# Patient Record
Sex: Female | Born: 1995
Health system: Southern US, Community
[De-identification: ages and names within clinical notes are randomized; demographics above are authoritative.]

## PROBLEM LIST (undated history)

## (undated) ENCOUNTER — Inpatient Hospital Stay (HOSPITAL_COMMUNITY): Payer: Self-pay

## (undated) DIAGNOSIS — F419 Anxiety disorder, unspecified: Secondary | ICD-10-CM

## (undated) DIAGNOSIS — F32A Depression, unspecified: Secondary | ICD-10-CM

## (undated) DIAGNOSIS — F329 Major depressive disorder, single episode, unspecified: Secondary | ICD-10-CM

## (undated) DIAGNOSIS — D649 Anemia, unspecified: Secondary | ICD-10-CM

## (undated) DIAGNOSIS — K219 Gastro-esophageal reflux disease without esophagitis: Secondary | ICD-10-CM

## (undated) DIAGNOSIS — M549 Dorsalgia, unspecified: Secondary | ICD-10-CM

## (undated) HISTORY — DX: Gastro-esophageal reflux disease without esophagitis: K21.9

## (undated) HISTORY — PX: NASAL SINUS SURGERY: SHX719

## (undated) HISTORY — DX: Anxiety disorder, unspecified: F41.9

## (undated) HISTORY — PX: TONSILLECTOMY: SUR1361

---

## 2016-04-01 LAB — OB RESULTS CONSOLE ANTIBODY SCREEN: ANTIBODY SCREEN: NEGATIVE

## 2016-04-01 LAB — CYSTIC FIBROSIS DIAGNOSTIC STUDY: Interpretation-CFDNA:: NEGATIVE

## 2016-04-01 LAB — CYTOLOGY - PAP: Pap: NEGATIVE

## 2016-04-01 LAB — OB RESULTS CONSOLE RPR: RPR: NONREACTIVE

## 2016-04-01 LAB — OB RESULTS CONSOLE HIV ANTIBODY (ROUTINE TESTING): HIV: NONREACTIVE

## 2016-04-01 LAB — OB RESULTS CONSOLE GC/CHLAMYDIA
CHLAMYDIA, DNA PROBE: NEGATIVE
GC PROBE AMP, GENITAL: NEGATIVE

## 2016-04-01 LAB — OB RESULTS CONSOLE RUBELLA ANTIBODY, IGM: Rubella: IMMUNE

## 2016-04-01 LAB — OB RESULTS CONSOLE ABO/RH: RH TYPE: POSITIVE

## 2016-04-01 LAB — OB RESULTS CONSOLE HEPATITIS B SURFACE ANTIGEN: HEP B S AG: NEGATIVE

## 2016-05-22 ENCOUNTER — Encounter: Payer: Self-pay | Admitting: Family Medicine

## 2016-05-23 DIAGNOSIS — Z3490 Encounter for supervision of normal pregnancy, unspecified, unspecified trimester: Secondary | ICD-10-CM | POA: Insufficient documentation

## 2016-05-28 ENCOUNTER — Encounter: Payer: Self-pay | Admitting: Obstetrics and Gynecology

## 2016-05-28 ENCOUNTER — Other Ambulatory Visit: Payer: Self-pay | Admitting: Obstetrics and Gynecology

## 2016-05-28 ENCOUNTER — Ambulatory Visit (INDEPENDENT_AMBULATORY_CARE_PROVIDER_SITE_OTHER): Payer: Medicaid Other | Admitting: Obstetrics and Gynecology

## 2016-05-28 VITALS — BP 126/66 | HR 85 | Ht 65.0 in | Wt 110.1 lb

## 2016-05-28 DIAGNOSIS — Z3401 Encounter for supervision of normal first pregnancy, first trimester: Secondary | ICD-10-CM

## 2016-05-28 NOTE — Progress Notes (Signed)
New OB Note  05/28/2016   Clinic: Center for [facility]  Chief Complaint: NOB  Transfer of Care Patient: Yes, from [place]  History of Present Illness: Kaitlyn Whitaker is a 20 y.o. G2P0010 @ 18/6 weeks (EDC 2-21, based on Patient's last menstrual period was 01/17/2016 (exact date).=13wk u/s), with the above CC. Preg complicated by has Supervision of normal pregnancy on her problem list.   Patient from here but was in [place] with FOB but relationship didn't work out so she's back home with family. Patient states she's gained 15 lbs since start of pregnancy   She has Negative signs or symptoms of nausea/vomiting of pregnancy. She has Negative signs or symptoms of miscarriage or preterm labor She identifies Negative Zika risk factors for her and her partner On any different medications around the time she conceived/early pregnancy: No   ROS: A 12-point review of systems was performed and negative, except as stated in the above HPI.  OBGYN History: As per HPI. OB History  Gravida Para Term Preterm AB Living  2       1    SAB TAB Ectopic Multiple Live Births  1            # Outcome Date GA Lbr Len/2nd Weight Sex Delivery Anes PTL Lv  2 Current           1 SAB               Any issues with any prior pregnancies: not applicable Any prior children are healthy, doing well, without any problems or issues: not applicable History of pap smears: No. Last pap smear 2017. Abnormal: no  History of STIs: No   Past Medical History: Past Medical History:  Diagnosis Date  . GERD (gastroesophageal reflux disease)     Past Surgical History: Past Surgical History:  Procedure Laterality Date  . TONSILLECTOMY      Family History:  Family History  Problem Relation Age of Onset  . Diabetes Maternal Uncle    She denies any history of mental retardation, birth defects or genetic disorders in her or the FOB's history  Social History:  Social History   Social History  . Marital  status: Single    Spouse name: N/A  . Number of children: N/A  . Years of education: N/A   Occupational History  . Not on file.   Social History Main Topics  . Smoking status: Never Smoker  . Smokeless tobacco: Never Used  . Alcohol use No  . Drug use: Unknown  . Sexual activity: No   Other Topics Concern  . Not on file   Social History Narrative  . No narrative on file   Any pets in the household: no Works at a Daycare  Allergy: Not on File  Health Maintenance:  Mammogram Up to Date: not applicable  Current Outpatient Medications: PNV  Physical Exam:   BP 126/66   Pulse 85   Ht 5\' 5"  (1.651 m)   Wt 110 lb 1.6 oz (49.9 kg)   LMP 01/17/2016 (Exact Date)   BMI 18.32 kg/m  Body mass index is 18.32 kg/m. Fundal height: not applicable FHTs: 140s  General appearance: Well nourished, well developed female in no acute distress.  Neck:  Supple, normal appearance, and no thyromegaly  Cardiovascular: S1, S2 normal, no murmur, rub or gallop, regular rate and rhythm Respiratory:  Clear to auscultation bilateral. Normal respiratory effort Abdomen: positive bowel sounds and no masses, hernias; diffusely non tender  to palpation, non distended Neuro/Psych:  Normal mood and affect.  Skin:  Warm and dry.  Lymphatic:  No inguinal lymphadenopathy. Pelvic exam: deferred  Laboratory: B pos/RI/rpr NR/hepB neg/hiv NR/ Negative: TSH, Ucx, GC-CT; negative inherigen fragile x, CF and SMA  Imaging:  Patient has picture of u/s from Co with her  Assessment: pt doing well  Plan: 1. Encounter for supervision of normal first pregnancy in first trimester Anatomy scan scheduled. Pt told about infectious precautions working at day care including flu, parvo, etc and told to let us know if any concern to any exposures. Pt declines afp tetra. Flu shot nv.   - Pain Mgmt, Profile 6 Conf w/o mM, U - Culture, OB Urine - US MFM OB COMP + 14 WK; Future  Problem list reviewed and  updated.  Follow up in 4 weeks.  >50% of 25 min visit spent on counseling and coordination of care.     Kaitlyn Whitaker, Jr. MD Attending [facility](Faculty Practice)

## 2016-05-29 ENCOUNTER — Encounter (HOSPITAL_COMMUNITY): Payer: Self-pay | Admitting: Obstetrics and Gynecology

## 2016-05-29 LAB — POCT URINALYSIS DIP (DEVICE)
Bilirubin Urine: NEGATIVE
GLUCOSE, UA: NEGATIVE mg/dL
HGB URINE DIPSTICK: NEGATIVE
Ketones, ur: NEGATIVE mg/dL
NITRITE: NEGATIVE
Protein, ur: NEGATIVE mg/dL
Specific Gravity, Urine: 1.015 (ref 1.005–1.030)
UROBILINOGEN UA: 0.2 mg/dL (ref 0.0–1.0)
pH: 7.5 (ref 5.0–8.0)

## 2016-05-29 LAB — CULTURE, OB URINE
Colony Count: NO GROWTH
Organism ID, Bacteria: NO GROWTH

## 2016-05-31 LAB — PAIN MGMT, PROFILE 6 CONF W/O MM, U
6 ACETYLMORPHINE: NEGATIVE ng/mL (ref <10)
AMPHETAMINES: NEGATIVE ng/mL (ref <500)
Alcohol Metabolites: NEGATIVE ng/mL (ref <500)
BARBITURATES: NEGATIVE ng/mL (ref <300)
Benzodiazepines: NEGATIVE ng/mL (ref <100)
COCAINE METABOLITE: NEGATIVE ng/mL (ref <150)
CREATININE: 70.7 mg/dL (ref >=20.0)
Marijuana Metabolite: NEGATIVE ng/mL (ref <20)
Methadone Metabolite: NEGATIVE ng/mL (ref <100)
OXIDANT: NEGATIVE ug/mL (ref <200)
OXYCODONE: NEGATIVE ng/mL (ref <100)
Opiates: NEGATIVE ng/mL (ref <100)
PH: 6.53 (ref 4.5–9.0)
PHENCYCLIDINE: NEGATIVE ng/mL (ref <25)
Please note:: 0

## 2016-06-07 ENCOUNTER — Ambulatory Visit (HOSPITAL_COMMUNITY): Payer: Medicaid Other

## 2016-06-21 ENCOUNTER — Other Ambulatory Visit: Payer: Self-pay | Admitting: Obstetrics and Gynecology

## 2016-06-21 ENCOUNTER — Ambulatory Visit (HOSPITAL_COMMUNITY)
Admission: RE | Admit: 2016-06-21 | Discharge: 2016-06-21 | Disposition: A | Discharge status: Home / Self Care | Payer: Medicaid Other | Source: Ambulatory Visit | Attending: Obstetrics and Gynecology | Admitting: Obstetrics and Gynecology

## 2016-06-21 DIAGNOSIS — Z3A22 22 weeks gestation of pregnancy: Secondary | ICD-10-CM

## 2016-06-21 DIAGNOSIS — Z3401 Encounter for supervision of normal first pregnancy, first trimester: Secondary | ICD-10-CM

## 2016-06-21 DIAGNOSIS — Z363 Encounter for antenatal screening for malformations: Secondary | ICD-10-CM | POA: Diagnosis present

## 2016-06-25 ENCOUNTER — Ambulatory Visit (INDEPENDENT_AMBULATORY_CARE_PROVIDER_SITE_OTHER): Payer: Medicaid Other | Admitting: Obstetrics and Gynecology

## 2016-06-25 ENCOUNTER — Telehealth: Payer: Self-pay | Admitting: *Deleted

## 2016-06-25 VITALS — BP 116/65 | HR 71 | Wt 119.0 lb

## 2016-06-25 DIAGNOSIS — Z3402 Encounter for supervision of normal first pregnancy, second trimester: Secondary | ICD-10-CM

## 2016-06-25 DIAGNOSIS — Z23 Encounter for immunization: Secondary | ICD-10-CM

## 2016-06-25 NOTE — Addendum Note (Signed)
Addended by: Faythe CasaBELLAMY, Nole Robey M on: 06/25/2016 12:05 PM   Modules accepted: Orders

## 2016-06-25 NOTE — Progress Notes (Signed)
Flu vaccine today 

## 2016-06-25 NOTE — Patient Instructions (Signed)

## 2016-06-25 NOTE — Progress Notes (Signed)
   PRENATAL VISIT NOTE  Subjective:  Kaitlyn Whitaker is a 20 y.o. G2P0010 at 4560w6d being seen today for ongoing prenatal care.  She is currently monitored for the following issues for this low-risk pregnancy and has Supervision of normal pregnancy on her problem list.  Patient reports no complaints.  Contractions: Not present. Vag. Bleeding: None.  Movement: Present. Denies leaking of fluid.   The following portions of the patient's history were reviewed and updated as appropriate: allergies, current medications, past family history, past medical history, past social history, past surgical history and problem list. Problem list updated. Works as Teaching laboratory techniciantoddler teacher.   Objective:   Vitals:   06/25/16 1125  BP: 116/65  Pulse: 71  Weight: 119 lb (54 kg)    Fetal Status: Fetal Heart Rate (bpm): 146   Movement: Present     General:  Alert, oriented and cooperative. Patient is in no acute distress.  Skin: Skin is warm and dry. No rash noted.   Cardiovascular: Normal heart rate noted  Respiratory: Normal respiratory effort, no problems with respiration noted  Abdomen: Soft, gravid, appropriate for gestational age. Pain/Pressure: Present     Pelvic:  Cervical exam deferred        Extremities: Normal range of motion.  Edema: None  Mental Status: Normal mood and affect. Normal behavior. Normal judgment and thought content.   Assessment and Plan:  Pregnancy: G2P0010 at 8360w6d Encounter for supervision of normal first pregnancy in second trimester  Preterm labor symptoms and general obstetric precautions including but not limited to vaginal bleeding, contractions, leaking of fluid and fetal movement were reviewed in detail with the patient. Please refer to After Visit Summary for other counseling recommendations.  Return in about 4 weeks (around 07/23/2016).  Danae Orleanseirdre C Aarion Kittrell, CNM

## 2016-06-25 NOTE — Telephone Encounter (Signed)
Pt left message yesterday @ 1028 stating that she had received a call regarding a prescription but when she called her pharmacy they did not have any prescription on file for her. Per chart review, no prescription has been given by any of our providers and there is not any documentation about a call placed to her. I called pt and left message on her personal voice mail stating this information. Pt has appt today in our office @ 1040.

## 2016-07-04 LAB — AMB REFERRAL TO OB-GYN: Urine Culture, OB: NO GROWTH

## 2016-08-01 ENCOUNTER — Ambulatory Visit (INDEPENDENT_AMBULATORY_CARE_PROVIDER_SITE_OTHER): Payer: Medicaid Other | Admitting: Family

## 2016-08-01 DIAGNOSIS — Z23 Encounter for immunization: Secondary | ICD-10-CM

## 2016-08-01 DIAGNOSIS — Z3403 Encounter for supervision of normal first pregnancy, third trimester: Secondary | ICD-10-CM | POA: Diagnosis present

## 2016-08-01 NOTE — Patient Instructions (Signed)
CIRCUMCISION  Circumcision is considered an elective/non-medically necessary procedure. There are many reasons parents decide to have their sons circumsized. During the first year of life circumcised males have a reduced risk of urinary tract infections but after this year the rates between circumcised males and uncircumcised males are the same.  It is safe to have your son circumcised outside of the hospital and the places above perform them regularly.    Places to have your son circumcised:    [facility] [phone] $480 by 4 wks  [facility] [phone] $244 by 4 wks  [facility] [phone] $175 by 2 wks  [facility] [phone] $250 by 7 days [facility] [phone] $150 by 4 wks  These prices sometimes change but are roughly what you can expect to pay. Please call and confirm pricing.    AREA PEDIATRIC/FAMILY PRACTICE PHYSICIANS  [facility] [address], [address] [place], [place]  [address] Phone - [phone]   Fax - [phone]  [facility] [address] [place], [place] [address] Phone - [phone]   Fax - [phone]  [facility] [address] [place], [place]  [address] Phone - [phone]   Fax - [phone]  [facility] [address] [place], [place]  [address] Phone - [phone]   Fax - [phone]  [facility] [address] [place], [place]  [address] Phone - [phone]   Fax - [phone]  [facility] [address] [place], [place]  [address] Phone - [phone]   Fax - [phone]  [facility] [address] [place], [place]  [address] Phone - [phone]   Fax - [phone]  [facility] [address] [place], [place]  [address] Phone - [phone]   Fax - [phone]  [facility] [address] [place], [place]  [address] Phone - [phone]   Fax - [phone] [facility] [address] [place], [place]  [address] Phone - [phone]   Fax - [phone]  [facility] [address] [place], [place]  [address] Phone - [phone]   Fax - [phone]  [facility] [address] [place], [place]  [address] Phone - [phone]   Fax - [phone]  [facility] [address] [place], [place]  [address] Phone - [phone]   Fax - [phone]  [facility] [address] [place], [place]  [address] Phone - [phone]  [facility] [address] [place], [place]  [address] Phone - [phone]   Fax - [phone]  [facility] [address] [place], [place]  [address] Phone - [phone]   Fax - [phone]  [facility] [address] [place], [place]  [address] Phone - [phone]   Fax - [phone]  [facility] [address] [place], [place]  [address] Phone - [phone]   Fax - [phone]   [facility] [address] [place], [place]  [address] Phone - [phone]   Fax - [phone]  [facility] [address] [place], [place]  [address] Phone - [phone]   Fax - [phone]  [facility] [address] [place], [place]  [address] Phone - [phone]   Fax - [phone]  [facility] [address] [place], [place]  [address] Phone - [phone]   Fax - [phone]  [facility] [address] [place], [place]  [address] Phone - [phone]   Fax - [phone] [facility] [place] [id number] [place], [place]  [address] Phone - [phone]   Fax - [phone]  [facility] [address] [place], [place]  [address] Phone - [phone]   Fax - [phone]  [facility] [place] [address] [place], [place]  [address] Phone - [phone]   Fax - [phone]

## 2016-08-01 NOTE — Progress Notes (Signed)
tdap given

## 2016-08-01 NOTE — Progress Notes (Signed)
   PRENATAL VISIT NOTE  Subjective:  Kaitlyn Whitaker is a 20 y.o. G2P0010 at 4663w1d being seen today for ongoing prenatal care.  She is currently monitored for the following issues for this low-risk pregnancy and has Supervision of normal pregnancy on her problem list.  Patient reports no complaints.  Contractions: Not present.  .  Movement: Present. Denies leaking of fluid.   The following portions of the patient's history were reviewed and updated as appropriate: allergies, current medications, past family history, past medical history, past social history, past surgical history and problem list. Problem list updated.  Objective:   Vitals:   08/01/16 0806  BP: 116/72  Pulse: 80  Weight: 125 lb (56.7 kg)    Fetal Status: Fetal Heart Rate (bpm): 128 Fundal Height: 27 cm Movement: Present     General:  Alert, oriented and cooperative. Patient is in no acute distress.  Skin: Skin is warm and dry. No rash noted.   Cardiovascular: Normal heart rate noted  Respiratory: Normal respiratory effort, no problems with respiration noted  Abdomen: Soft, gravid, appropriate for gestational age. Pain/Pressure: Present     Pelvic:  Cervical exam deferred        Extremities: Normal range of motion.     Mental Status: Normal mood and affect. Normal behavior. Normal judgment and thought content.   Assessment and Plan:  Pregnancy: G2P0010 at 5663w1d  1. Encounter for supervision of normal first pregnancy in third trimester -Tdap today - Plans to return for 2 hr - Reviewed fundal height and normal FHR range  Preterm labor symptoms and general obstetric precautions including but not limited to vaginal bleeding, contractions, leaking of fluid and fetal movement were reviewed in detail with the patient. Please refer to After Visit Summary for other counseling recommendations.  Return in about 2 weeks (around 08/15/2016) for 1 wk 2 hr only.   Eino FarberWalidah Kennith GainN Karim, CNM

## 2016-08-08 ENCOUNTER — Other Ambulatory Visit: Payer: Medicaid Other

## 2016-08-08 DIAGNOSIS — Z3400 Encounter for supervision of normal first pregnancy, unspecified trimester: Secondary | ICD-10-CM

## 2016-08-08 LAB — CBC
HCT: 33.5 % — ABNORMAL LOW (ref 35.0–45.0)
Hemoglobin: 11.2 g/dL — ABNORMAL LOW (ref 11.7–15.5)
MCH: 29.3 pg (ref 27.0–33.0)
MCHC: 33.4 g/dL (ref 32.0–36.0)
MCV: 87.7 fL (ref 80.0–100.0)
MPV: 9 fL (ref 7.5–12.5)
PLATELETS: 224 10*3/uL (ref 140–400)
RBC: 3.82 MIL/uL (ref 3.80–5.10)
RDW: 12.5 % (ref 11.0–15.0)
WBC: 9.5 10*3/uL (ref 3.8–10.8)

## 2016-08-09 LAB — RPR

## 2016-08-09 LAB — 2HR GTT W 1 HR, CARPENTER, 75 G
GLUCOSE, 2 HR, GEST: 69 mg/dL (ref <153)
Glucose, 1 Hr, Gest: 97 mg/dL (ref <180)
Glucose, Fasting, Gest: 78 mg/dL (ref 65–91)

## 2016-08-09 LAB — HIV ANTIBODY (ROUTINE TESTING W REFLEX): HIV 1&2 Ab, 4th Generation: NONREACTIVE

## 2016-08-15 ENCOUNTER — Ambulatory Visit (INDEPENDENT_AMBULATORY_CARE_PROVIDER_SITE_OTHER): Payer: Medicaid Other | Admitting: Family

## 2016-08-15 DIAGNOSIS — Z3403 Encounter for supervision of normal first pregnancy, third trimester: Secondary | ICD-10-CM

## 2016-08-15 NOTE — Progress Notes (Signed)
   PRENATAL VISIT NOTE  Subjective:  Kaitlyn Whitaker is a 20 y.o. G2P0010 at 2022w1d being seen today for ongoing prenatal care.  She is currently monitored for the following issues for this low-risk pregnancy and has Supervision of normal pregnancy, antepartum on her problem list.  Patient reports no complaints.  Contractions: Not present.  .  Movement: Present. Denies leaking of fluid.   The following portions of the patient's history were reviewed and updated as appropriate: allergies, current medications, past family history, past medical history, past social history, past surgical history and problem list. Problem list updated.  Objective:   Vitals:   08/15/16 0807  BP: (!) 120/57  Pulse: 77  Weight: 127 lb 1.6 oz (57.7 kg)    Fetal Status: Fetal Heart Rate (bpm): 150 Fundal Height: 29 cm Movement: Present     General:  Alert, oriented and cooperative. Patient is in no acute distress.  Skin: Skin is warm and dry. No rash noted.   Cardiovascular: Normal heart rate noted  Respiratory: Normal respiratory effort, no problems with respiration noted  Abdomen: Soft, gravid, appropriate for gestational age. Pain/Pressure: Present     Pelvic:  Cervical exam deferred        Extremities: Normal range of motion.  Edema: None  Mental Status: Normal mood and affect. Normal behavior. Normal judgment and thought content.   Assessment and Plan:  Pregnancy: G2P0010 at 202w1d  1. Encounter for supervision of normal first pregnancy in third trimester - Reviewed third trimester lab results - discussed prenatal visit schedule  Preterm labor symptoms and general obstetric precautions including but not limited to vaginal bleeding, contractions, leaking of fluid and fetal movement were reviewed in detail with the patient. Please refer to After Visit Summary for other counseling recommendations.  Return in about 2 weeks (around 08/29/2016).   Eino FarberWalidah Kennith GainN Karim, CNM

## 2016-08-30 ENCOUNTER — Encounter: Payer: Medicaid Other | Admitting: Family Medicine

## 2016-09-02 NOTE — L&D Delivery Note (Signed)
20 y.o. G2P0010 at 7648w6d delivered a viable female infant in cephalic, LOA position. No nuchal cord. Right anterior shoulder delivered with ease. 60 sec delayed cord clamping. Cord clamped x2 and cut. Placenta delivered spontaneously intact, with 3VC. Fundus firm on exam with massage and pitocin. Good hemostasis noted.  Anesthesia: Epidural Laceration: Bilateral periurethral Suture: 4-0 Monocryl Good hemostasis noted. EBL: 200 cc  Mom and baby recovering in LDR.    Apgars: APGAR (1 MIN): 8   APGAR (5 MINS): 9    Weight: Pending skin to skin    Kaitlyn MowElizabeth Sharnelle Cappelli, DO OB Fellow [facility] 10/15/2016, 9:05 PM

## 2016-09-04 ENCOUNTER — Encounter: Payer: Medicaid Other | Admitting: Obstetrics and Gynecology

## 2016-09-10 ENCOUNTER — Ambulatory Visit (INDEPENDENT_AMBULATORY_CARE_PROVIDER_SITE_OTHER): Payer: Medicaid Other | Admitting: Advanced Practice Midwife

## 2016-09-10 VITALS — BP 115/70 | HR 76 | Wt 133.0 lb

## 2016-09-10 DIAGNOSIS — O26843 Uterine size-date discrepancy, third trimester: Secondary | ICD-10-CM

## 2016-09-10 DIAGNOSIS — Z3403 Encounter for supervision of normal first pregnancy, third trimester: Secondary | ICD-10-CM

## 2016-09-10 NOTE — Progress Notes (Signed)
   PRENATAL VISIT NOTE  Subjective:  Kaitlyn Whitaker is a 21 y.o. G2P0010 at 2259w6d being seen today for ongoing prenatal care.  She is currently monitored for the following issues for this low-risk pregnancy and has Supervision of normal pregnancy, antepartum on her problem list.  Patient reports no complaints.  Contractions: Not present. Vag. Bleeding: None.  Movement: Present. Denies leaking of fluid.   The following portions of the patient's history were reviewed and updated as appropriate: allergies, current medications, past family history, past medical history, past social history, past surgical history and problem list. Problem list updated.  Objective:   Vitals:   09/10/16 0840  BP: 115/70  Pulse: 76  Weight: 133 lb (60.3 kg)    Fetal Status: Fetal Heart Rate (bpm): 130 Fundal Height: 30 cm Movement: Present     General:  Alert, oriented and cooperative. Patient is in no acute distress.  Skin: Skin is warm and dry. No rash noted.   Cardiovascular: Normal heart rate noted  Respiratory: Normal respiratory effort, no problems with respiration noted  Abdomen: Soft, gravid, appropriate for gestational age. Pain/Pressure: Present     Pelvic:  Cervical exam deferred        Extremities: Normal range of motion.  Edema: None  Mental Status: Normal mood and affect. Normal behavior. Normal judgment and thought content.   Assessment and Plan:  Pregnancy: G2P0010 at 8359w6d  1. Encounter for supervision of normal first pregnancy in third trimester   2. Uterine size date discrepancy pregnancy, third trimester --Pt missed last OB appt for family reasons, FH was 29 cm at 30 weeks.  Measures 30 cm at today's visit. Growth US ordered with MFM. - US MFM OB FOLLOW UP; Future  Preterm labor symptoms and general obstetric precautions including but not limited to vaginal bleeding, contractions, leaking of fluid and fetal movement were reviewed in detail with the patient. Please refer to  After Visit Summary for other counseling recommendations.  Return in about 2 weeks (around 09/24/2016).   Hurshel PartyLisa A Leftwich-Kirby, CNM

## 2016-09-10 NOTE — Progress Notes (Signed)
   PRENATAL VISIT NOTE  Subjective:  Kaitlyn Whitaker is a 21 y.o. G2P0010 at 104104w6d being seen today for ongoing prenatal care.  She is currently monitored for the following issues for this low-risk pregnancy and has Supervision of normal pregnancy, antepartum on her problem list.  Patient reports no complaints.  Contractions: Not present. Vag. Bleeding: None.  Movement: Present. Denies leaking of fluid.   The following portions of the patient's history were reviewed and updated as appropriate: allergies, current medications, past family history, past medical history, past social history, past surgical history and problem list. Problem list updated.  Objective:   Vitals:   09/10/16 0840  BP: 115/70  Pulse: 76  Weight: 133 lb (60.3 kg)    Fetal Status: Fetal Heart Rate (bpm): 130 Fundal Height: 30 cm Movement: Present     General:  Alert, oriented and cooperative. Patient is in no acute distress.  Skin: Skin is warm and dry. No rash noted.   Cardiovascular: Normal heart rate noted  Respiratory: Normal respiratory effort, no problems with respiration noted  Abdomen: Soft, gravid, appropriate for gestational age. Pain/Pressure: Present     Pelvic:  Cervical exam deferred        Extremities: Normal range of motion.  Edema: None  Mental Status: Normal mood and affect. Normal behavior. Normal judgment and thought content.   Assessment and Plan:  Pregnancy: G2P0010 at 18104w6d  1. Encounter for supervision of normal first pregnancy in third trimester   2. Uterine size date discrepancy pregnancy, third trimester --FH30 cm today at 45104w6d, 29 cm at last vist at 30 weeks. Pt missed previous OB visit for family reasons.  F/U US ordered for growth. - US MFM OB FOLLOW UP; Future  Preterm labor symptoms and general obstetric precautions including but not limited to vaginal bleeding, contractions, leaking of fluid and fetal movement were reviewed in detail with the patient. Please refer to  After Visit Summary for other counseling recommendations.  Return in about 2 weeks (around 09/24/2016).   Hurshel PartyLisa A Leftwich-Kirby, CNM

## 2016-09-10 NOTE — Patient Instructions (Signed)
Third Trimester of Pregnancy The third trimester is from week 29 through week 40 (months 7 through 9). The third trimester is a time when the unborn baby (fetus) is growing rapidly. At the end of the ninth month, the fetus is about 20 inches in length and weighs 6-10 pounds. Body changes during your third trimester Your body goes through many changes during pregnancy. The changes vary from woman to woman. During the third trimester:  Your weight will continue to increase. You can expect to gain 25-35 pounds (11-16 kg) by the end of the pregnancy.  You may begin to get stretch marks on your hips, abdomen, and breasts.  You may urinate more often because the fetus is moving lower into your pelvis and pressing on your bladder.  You may develop or continue to have heartburn. This is caused by increased hormones that slow down muscles in the digestive tract.  You may develop or continue to have constipation because increased hormones slow digestion and cause the muscles that push waste through your intestines to relax.  You may develop hemorrhoids. These are swollen veins (varicose veins) in the rectum that can itch or be painful.  You may develop swollen, bulging veins (varicose veins) in your legs.  You may have increased body aches in the pelvis, back, or thighs. This is due to weight gain and increased hormones that are relaxing your joints.  You may have changes in your hair. These can include thickening of your hair, rapid growth, and changes in texture. Some women also have hair loss during or after pregnancy, or hair that feels dry or thin. Your hair will most likely return to normal after your baby is born.  Your breasts will continue to grow and they will continue to become tender. A yellow fluid (colostrum) may leak from your breasts. This is the first milk you are producing for your baby.  Your belly button may stick out.  You may notice more swelling in your hands, face, or  ankles.  You may have increased tingling or numbness in your hands, arms, and legs. The skin on your belly may also feel numb.  You may feel short of breath because of your expanding uterus.  You may have more problems sleeping. This can be caused by the size of your belly, increased need to urinate, and an increase in your body's metabolism.  You may notice the fetus "dropping," or moving lower in your abdomen.  You may have increased vaginal discharge.  Your cervix becomes thin and soft (effaced) near your due date. What to expect at prenatal visits You will have prenatal exams every 2 weeks until week 36. Then you will have weekly prenatal exams. During a routine prenatal visit:  You will be weighed to make sure you and the fetus are growing normally.  Your blood pressure will be taken.  Your abdomen will be measured to track your baby's growth.  The fetal heartbeat will be listened to.  Any test results from the previous visit will be discussed.  You may have a cervical check near your due date to see if you have effaced. At around 36 weeks, your health care provider will check your cervix. At the same time, your health care provider will also perform a test on the secretions of the vaginal tissue. This test is to determine if a type of bacteria, Group B streptococcus, is present. Your health care provider will explain this further. Your health care provider may ask you:    What your birth plan is.  How you are feeling.  If you are feeling the baby move.  If you have had any abnormal symptoms, such as leaking fluid, bleeding, severe headaches, or abdominal cramping.  If you are using any tobacco products, including cigarettes, chewing tobacco, and electronic cigarettes.  If you have any questions. Other tests or screenings that may be performed during your third trimester include:  Blood tests that check for low iron levels (anemia).  Fetal testing to check the health,  activity level, and growth of the fetus. Testing is done if you have certain medical conditions or if there are problems during the pregnancy.  Nonstress test (NST). This test checks the health of your baby to make sure there are no signs of problems, such as the baby not getting enough oxygen. During this test, a belt is placed around your belly. The baby is made to move, and its heart rate is monitored during movement. What is false labor? False labor is a condition in which you feel small, irregular tightenings of the muscles in the womb (contractions) that eventually go away. These are called Braxton Hicks contractions. Contractions may last for hours, days, or even weeks before true labor sets in. If contractions come at regular intervals, become more frequent, increase in intensity, or become painful, you should see your health care provider. What are the signs of labor?  Abdominal cramps.  Regular contractions that start at 10 minutes apart and become stronger and more frequent with time.  Contractions that start on the top of the uterus and spread down to the lower abdomen and back.  Increased pelvic pressure and dull back pain.  A watery or bloody mucus discharge that comes from the vagina.  Leaking of amniotic fluid. This is also known as your "water breaking." It could be a slow trickle or a gush. Let your doctor know if it has a color or strange odor. If you have any of these signs, call your health care provider right away, even if it is before your due date. Follow these instructions at home: Eating and drinking  Continue to eat regular, healthy meals.  Do not eat:  Raw meat or meat spreads.  Unpasteurized milk or cheese.  Unpasteurized juice.  Store-made salad.  Refrigerated smoked seafood.  Hot dogs or deli meat, unless they are piping hot.  More than 6 ounces of albacore tuna a week.  Shark, swordfish, king mackerel, or tile fish.  Store-made salads.  Raw  sprouts, such as mung bean or alfalfa sprouts.  Take prenatal vitamins as told by your health care provider.  Take 1000 mg of calcium daily as told by your health care provider.  If you develop constipation:  Take over-the-counter or prescription medicines.  Drink enough fluid to keep your urine clear or pale yellow.  Eat foods that are high in fiber, such as fresh fruits and vegetables, whole grains, and beans.  Limit foods that are high in fat and processed sugars, such as fried and sweet foods. Activity  Exercise only as directed by your health care provider. Healthy pregnant women should aim for 2 hours and 30 minutes of moderate exercise per week. If you experience any pain or discomfort while exercising, stop.  Avoid heavy lifting.  Do not exercise in extreme heat or humidity, or at high altitudes.  Wear low-heel, comfortable shoes.  Practice good posture.  Do not travel far distances unless it is absolutely necessary and only with the approval   of your health care provider.  Wear your seat belt at all times while in a car, on a bus, or on a plane.  Take frequent breaks and rest with your legs elevated if you have leg cramps or low back pain.  Do not use hot tubs, steam rooms, or saunas.  You may continue to have sex unless your health care provider tells you otherwise. Lifestyle  Do not use any products that contain nicotine or tobacco, such as cigarettes and e-cigarettes. If you need help quitting, ask your health care provider.  Do not drink alcohol.  Do not use any medicinal herbs or unprescribed drugs. These chemicals affect the formation and growth of the baby.  If you develop varicose veins:  Wear support pantyhose or compression stockings as told by your healthcare provider.  Elevate your feet for 15 minutes, 3-4 times a day.  Wear a supportive maternity bra to help with breast tenderness. General instructions  Take over-the-counter and prescription  medicines only as told by your health care provider. There are medicines that are either safe or unsafe to take during pregnancy.  Take warm sitz baths to soothe any pain or discomfort caused by hemorrhoids. Use hemorrhoid cream or witch hazel if your health care provider approves.  Avoid cat litter boxes and soil used by cats. These carry germs that can cause birth defects in the baby. If you have a cat, ask someone to clean the litter box for you.  To prepare for the arrival of your baby:  Take prenatal classes to understand, practice, and ask questions about the labor and delivery.  Make a trial run to the hospital.  Visit the hospital and tour the maternity area.  Arrange for maternity or paternity leave through employers.  Arrange for family and friends to take care of pets while you are in the hospital.  Purchase a rear-facing car seat and make sure you know how to install it in your car.  Pack your hospital bag.  Prepare the baby's nursery. Make sure to remove all pillows and stuffed animals from the baby's crib to prevent suffocation.  Visit your dentist if you have not gone during your pregnancy. Use a soft toothbrush to brush your teeth and be gentle when you floss.  Keep all prenatal follow-up visits as told by your health care provider. This is important. Contact a health care provider if:  You are unsure if you are in labor or if your water has broken.  You become dizzy.  You have mild pelvic cramps, pelvic pressure, or nagging pain in your abdominal area.  You have lower back pain.  You have persistent nausea, vomiting, or diarrhea.  You have an unusual or bad smelling vaginal discharge.  You have pain when you urinate. Get help right away if:  You have a fever.  You are leaking fluid from your vagina.  You have spotting or bleeding from your vagina.  You have severe abdominal pain or cramping.  You have rapid weight loss or weight gain.  You have  shortness of breath with chest pain.  You notice sudden or extreme swelling of your face, hands, ankles, feet, or legs.  Your baby makes fewer than 10 movements in 2 hours.  You have severe headaches that do not go away with medicine.  You have vision changes. Summary  The third trimester is from week 29 through week 40, months 7 through 9. The third trimester is a time when the unborn baby (fetus)   is growing rapidly.  During the third trimester, your discomfort may increase as you and your baby continue to gain weight. You may have abdominal, leg, and back pain, sleeping problems, and an increased need to urinate.  During the third trimester your breasts will keep growing and they will continue to become tender. A yellow fluid (colostrum) may leak from your breasts. This is the first milk you are producing for your baby.  False labor is a condition in which you feel small, irregular tightenings of the muscles in the womb (contractions) that eventually go away. These are called Braxton Hicks contractions. Contractions may last for hours, days, or even weeks before true labor sets in.  Signs of labor can include: abdominal cramps; regular contractions that start at 10 minutes apart and become stronger and more frequent with time; watery or bloody mucus discharge that comes from the vagina; increased pelvic pressure and dull back pain; and leaking of amniotic fluid. This information is not intended to replace advice given to you by your health care provider. Make sure you discuss any questions you have with your health care provider. Document Released: 08/13/2001 Document Revised: 01/25/2016 Document Reviewed: 10/20/2012 Elsevier Interactive Patient Education  2017 [employer]  

## 2016-09-13 ENCOUNTER — Encounter (HOSPITAL_COMMUNITY): Payer: Self-pay | Admitting: *Deleted

## 2016-09-13 ENCOUNTER — Inpatient Hospital Stay (HOSPITAL_COMMUNITY)
Admission: AD | Admit: 2016-09-13 | Discharge: 2016-09-14 | Disposition: A | Discharge status: Home / Self Care | Payer: Medicaid Other | Source: Ambulatory Visit | Attending: Obstetrics & Gynecology | Admitting: Obstetrics & Gynecology

## 2016-09-13 DIAGNOSIS — R000 Tachycardia, unspecified: Secondary | ICD-10-CM | POA: Diagnosis not present

## 2016-09-13 DIAGNOSIS — J069 Acute upper respiratory infection, unspecified: Secondary | ICD-10-CM

## 2016-09-13 DIAGNOSIS — Z3A34 34 weeks gestation of pregnancy: Secondary | ICD-10-CM | POA: Diagnosis not present

## 2016-09-13 DIAGNOSIS — O99513 Diseases of the respiratory system complicating pregnancy, third trimester: Secondary | ICD-10-CM | POA: Diagnosis not present

## 2016-09-13 DIAGNOSIS — R002 Palpitations: Secondary | ICD-10-CM | POA: Diagnosis present

## 2016-09-13 LAB — URINALYSIS, ROUTINE W REFLEX MICROSCOPIC
BILIRUBIN URINE: NEGATIVE
Glucose, UA: NEGATIVE mg/dL
HGB URINE DIPSTICK: NEGATIVE
KETONES UR: NEGATIVE mg/dL
Leukocytes, UA: NEGATIVE
Nitrite: NEGATIVE
PROTEIN: NEGATIVE mg/dL
Specific Gravity, Urine: 1.004 — ABNORMAL LOW (ref 1.005–1.030)
pH: 7 (ref 5.0–8.0)

## 2016-09-13 NOTE — MAU Note (Signed)
My pulse started going up and down today. My apple watch alerted me. Do have some chest pain but have a cough. Took Tylenol 500mg  about 2240. Awoke about 0200 with sore throat and cough.

## 2016-09-14 LAB — TSH: TSH: 2.757 u[IU]/mL (ref 0.350–4.500)

## 2016-09-14 NOTE — Discharge Instructions (Signed)
Upper Respiratory Infection, Adult Most upper respiratory infections (URIs) are caused by a virus. A URI affects the nose, throat, and upper air passages. The most common type of URI is often called "the common cold." Follow these instructions at home:  Take medicines only as told by your doctor.  Gargle warm saltwater or take cough drops to comfort your throat as told by your doctor.  Use a warm mist humidifier or inhale steam from a shower to increase air moisture. This may make it easier to breathe.  Drink enough fluid to keep your pee (urine) clear or pale yellow.  Eat soups and other clear broths.  Have a healthy diet.  Rest as needed.  Go back to work when your fever is gone or your doctor says it is okay.  You may need to stay home longer to avoid giving your URI to others.  You can also wear a face mask and wash your hands often to prevent spread of the virus.  Use your inhaler more if you have asthma.  Do not use any tobacco products, including cigarettes, chewing tobacco, or electronic cigarettes. If you need help quitting, ask your doctor. Contact a doctor if:  You are getting worse, not better.  Your symptoms are not helped by medicine.  You have chills.  You are getting more short of breath.  You have brown or red mucus.  You have yellow or brown discharge from your nose.  You have pain in your face, especially when you bend forward.  You have a fever.  You have puffy (swollen) neck glands.  You have pain while swallowing.  You have white areas in the back of your throat. Get help right away if:  You have very bad or constant:  Headache.  Ear pain.  Pain in your forehead, behind your eyes, and over your cheekbones (sinus pain).  Chest pain.  You have long-lasting (chronic) lung disease and any of the following:  Wheezing.  Long-lasting cough.  Coughing up blood.  A change in your usual mucus.  You have a stiff neck.  You have  changes in your:  Vision.  Hearing.  Thinking.  Mood. This information is not intended to replace advice given to you by your health care provider. Make sure you discuss any questions you have with your health care provider. Document Released: 02/05/2008 Document Revised: 04/21/2016 Document Reviewed: 11/24/2013 Elsevier Interactive Patient Education  2017 [employer]  

## 2016-09-14 NOTE — MAU Provider Note (Signed)
History     CSN: [id number]  Arrival date and time: 09/13/16 2247   None     Chief Complaint  Patient presents with  . Palpitations   HPI  Patient is a 21 y.o. G2P0010 at 7015w3d who presents with rapid heart rate and palpitations x 1 day. Patient states that around 2 AM yesterday she developed runny nose, sore throat, cough, and some ear pain. 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 She admits to a subjective fever as well as home. She went to work earlier in the day and around noon she noted that her couple watch notified her that her heart rate was in the 120s. Patient states that throughout the day her heart rate has been ranging from 150s to 100s and has not been normal. She admits to some palpitations intermittently. Admits to some chest pain but only when coughing. Denies any shortness of breath or wheezing at this time. Her appetite has been good and she has been maintaining her hydration. Denies any new medications or supplements. Has had positive fetal movement, no vaginal discharge or bleeding, no contractions. She notes that she has not had any complications with this pregnancy.  OB History    Gravida Para Term Preterm AB Living   2       1     SAB TAB Ectopic Multiple Live Births   1              Past Medical History:  Diagnosis Date  . GERD (gastroesophageal reflux disease)     Past Surgical History:  Procedure Laterality Date  . TONSILLECTOMY      Family History  Problem Relation Age of Onset  . Diabetes Maternal Uncle     Social History  Substance Use Topics  . Smoking status: Never Smoker  . Smokeless tobacco: Never Used  . Alcohol use No    Allergies: No Known Allergies  Prescriptions Prior to Admission  Medication Sig Dispense Refill Last Dose  . acetaminophen (TYLENOL) 500 MG tablet Take 500 mg by mouth every 6 (six) hours as needed.   09/13/2016 at 2245  . Prenatal Vit-Fe Fumarate-FA (PRENATAL  VITAMIN) 27-0.8 MG TABS Take 1 tablet by mouth daily.   09/13/2016 at Unknown time    Review of Systems  Constitutional: Positive for fever. Negative for appetite change.  HENT: Positive for congestion, ear pain, rhinorrhea and sore throat. Negative for trouble swallowing.   Respiratory: Positive for cough. Negative for chest tightness, shortness of breath and wheezing.   Cardiovascular: Positive for chest pain (with coughing) and palpitations. Negative for leg swelling.  Gastrointestinal: Negative for abdominal distention, abdominal pain, constipation, diarrhea and nausea.   Physical Exam   Blood pressure 121/78, pulse (!) 122, temperature 99.4 F (37.4 C), resp. rate 18, height 5\' 5"  (1.651 m), weight 60.5 kg (133 lb 6.4 oz), last menstrual period 01/17/2016, SpO2 97 %.  Physical Exam  Constitutional: She is oriented to person, place, and time. She appears well-developed and well-nourished.  HENT:  Head: Normocephalic and atraumatic.  Right Ear: Tympanic membrane, external ear and ear canal normal.  Left Ear: Tympanic membrane, external ear and ear canal normal.  Nose: Mucosal edema present. No sinus tenderness. No epistaxis.  Mouth/Throat: Oropharynx is clear and moist. No oropharyngeal exudate.  Eyes: Conjunctivae and EOM are normal. Pupils are equal, round, and reactive to light. Right eye exhibits no discharge. Left eye exhibits no discharge. No scleral  icterus.  Neck: Normal range of motion. Neck supple. No thyromegaly present.  Cardiovascular: Normal rate, regular rhythm, normal heart sounds and intact distal pulses.   No murmur heard. Respiratory: Effort normal and breath sounds normal. No respiratory distress. She has no wheezes. She exhibits no tenderness.  GI: Bowel sounds are normal. There is no tenderness.  Musculoskeletal: Normal range of motion. She exhibits no edema, tenderness or deformity.  Lymphadenopathy:    She has no cervical adenopathy.  Neurological: She is  alert and oriented to person, place, and time. She exhibits normal muscle tone.  Skin: Skin is warm and dry. No rash noted. No erythema.  Psychiatric: She has a normal mood and affect. Her behavior is normal. Judgment and thought content normal.    MAU Course  Procedures  MDM Initial HR in the 120's upon presentation to the MAU.  EKG showing NSR  Physical exam within normal limits with exception of nasal mucosal edema bilaterally EFM: 145 bpm, moderate variability, good accelerations, no decels UA normal TSH drawn  Assessment and Plan  Upper respiratory infection: Symptoms x 1 day. Likely viral. No signs of pneumonia, low likelihood of strep throat with CENTOR score of 1 and normal appearing pharynx. No antibiotics warranted at this time. - Supportive care: rest, fluids, Tylenol PRN - Return precautions discussed  Tachycardia: Initially in the 120's but resolved while in the MAU. EKG reassuring with NSR. Likely due to fever in the setting of URI. Less likely anemia as hgb last month only mildly low at 11.2. Less likely dehydration as patient is well hydrated as evidenced by physical exam and UA. Cannot completely exclude thyroid abnormality so TSH was drawn and result can be followed up with OB.  - Return precautions discussed - Follow up with OB  Beaulah Dinning 09/14/2016, 12:20 AM   I have participated in the care of this patient and I agree with the above. Cam Hai CNM 10:04 AM 09/14/2016

## 2016-09-16 ENCOUNTER — Ambulatory Visit (HOSPITAL_COMMUNITY): Payer: Medicaid Other

## 2016-09-19 ENCOUNTER — Encounter (HOSPITAL_COMMUNITY): Payer: Self-pay | Admitting: Advanced Practice Midwife

## 2016-09-19 ENCOUNTER — Ambulatory Visit (HOSPITAL_COMMUNITY): Payer: Medicaid Other

## 2016-09-26 ENCOUNTER — Ambulatory Visit (INDEPENDENT_AMBULATORY_CARE_PROVIDER_SITE_OTHER): Payer: Medicaid Other | Admitting: Advanced Practice Midwife

## 2016-09-26 ENCOUNTER — Other Ambulatory Visit (HOSPITAL_COMMUNITY)
Admission: RE | Admit: 2016-09-26 | Discharge: 2016-09-26 | Disposition: A | Discharge status: Home / Self Care | Payer: Medicaid Other | Source: Ambulatory Visit | Attending: Advanced Practice Midwife | Admitting: Advanced Practice Midwife

## 2016-09-26 VITALS — BP 114/72 | HR 82 | Wt 132.1 lb

## 2016-09-26 DIAGNOSIS — Z113 Encounter for screening for infections with a predominantly sexual mode of transmission: Secondary | ICD-10-CM | POA: Insufficient documentation

## 2016-09-26 DIAGNOSIS — Z3403 Encounter for supervision of normal first pregnancy, third trimester: Secondary | ICD-10-CM

## 2016-09-26 DIAGNOSIS — Z3400 Encounter for supervision of normal first pregnancy, unspecified trimester: Secondary | ICD-10-CM

## 2016-09-26 LAB — OB RESULTS CONSOLE GBS: GBS: NEGATIVE

## 2016-09-26 LAB — OB RESULTS CONSOLE GC/CHLAMYDIA: Gonorrhea: NEGATIVE

## 2016-09-26 NOTE — Patient Instructions (Signed)
Contraception Choices Contraception (birth control) is the use of any methods or devices to prevent pregnancy. Below are some methods to help avoid pregnancy. Hormonal methods  Contraceptive implant. This is a thin, plastic tube containing progesterone hormone. It does not contain estrogen hormone. Your health care provider inserts the tube in the inner part of the upper arm. The tube can remain in place for up to 3 years. After 3 years, the implant must be removed. The implant prevents the ovaries from releasing an egg (ovulation), thickens the cervical mucus to prevent sperm from entering the uterus, and thins the lining of the inside of the uterus.  Progesterone-only injections. These injections are given every 3 months by your health care provider to prevent pregnancy. This synthetic progesterone hormone stops the ovaries from releasing eggs. It also thickens cervical mucus and changes the uterine lining. This makes it harder for sperm to survive in the uterus.  Birth control pills. These pills contain estrogen and progesterone hormone. They work by preventing the ovaries from releasing eggs (ovulation). They also cause the cervical mucus to thicken, preventing the sperm from entering the uterus. Birth control pills are prescribed by a health care provider.Birth control pills can also be used to treat heavy periods.  Minipill. This type of birth control pill contains only the progesterone hormone. They are taken every day of each month and must be prescribed by your health care provider.  Birth control patch. The patch contains hormones similar to those in birth control pills. It must be changed once a week and is prescribed by a health care provider.  Vaginal ring. The ring contains hormones similar to those in birth control pills. It is left in the vagina for 3 weeks, removed for 1 week, and then a new one is put back in place. The patient must be comfortable inserting and removing the ring from  the vagina.A health care provider's prescription is necessary.  Emergency contraception. Emergency contraceptives prevent pregnancy after unprotected sexual intercourse. This pill can be taken right after sex or up to 5 days after unprotected sex. It is most effective the sooner you take the pills after having sexual intercourse. Most emergency contraceptive pills are available without a prescription. Check with your pharmacist. Do not use emergency contraception as your only form of birth control. Barrier methods  Female condom. This is a thin sheath (latex or rubber) that is worn over the penis during sexual intercourse. It can be used with spermicide to increase effectiveness.  Female condom. This is a soft, loose-fitting sheath that is put into the vagina before sexual intercourse.  Diaphragm. This is a soft, latex, dome-shaped barrier that must be fitted by a health care provider. It is inserted into the vagina, along with a spermicidal jelly. It is inserted before intercourse. The diaphragm should be left in the vagina for 6 to 8 hours after intercourse.  Cervical cap. This is a round, soft, latex or plastic cup that fits over the cervix and must be fitted by a health care provider. The cap can be left in place for up to 48 hours after intercourse.  Sponge. This is a soft, circular piece of polyurethane foam. The sponge has spermicide in it. It is inserted into the vagina after wetting it and before sexual intercourse.  Spermicides. These are chemicals that kill or block sperm from entering the cervix and uterus. They come in the form of creams, jellies, suppositories, foam, or tablets. They do not require a prescription. They   are inserted into the vagina with an applicator before having sexual intercourse. The process must be repeated every time you have sexual intercourse. Intrauterine contraception  Intrauterine device (IUD). This is a T-shaped device that is put in a woman's uterus during  a menstrual period to prevent pregnancy. There are 2 types: ? Copper IUD. This type of IUD is wrapped in copper wire and is placed inside the uterus. Copper makes the uterus and fallopian tubes produce a fluid that kills sperm. It can stay in place for 10 years. ? Hormone IUD. This type of IUD contains the hormone progestin (synthetic progesterone). The hormone thickens the cervical mucus and prevents sperm from entering the uterus, and it also thins the uterine lining to prevent implantation of a fertilized egg. The hormone can weaken or kill the sperm that get into the uterus. It can stay in place for 3-5 years, depending on which type of IUD is used. Permanent methods of contraception  Female tubal ligation. This is when the woman's fallopian tubes are surgically sealed, tied, or blocked to prevent the egg from traveling to the uterus.  Hysteroscopic sterilization. This involves placing a small coil or insert into each fallopian tube. Your doctor uses a technique called hysteroscopy to do the procedure. The device causes scar tissue to form. This results in permanent blockage of the fallopian tubes, so the sperm cannot fertilize the egg. It takes about 3 months after the procedure for the tubes to become blocked. You must use another form of birth control for these 3 months.  Female sterilization. This is when the female has the tubes that carry sperm tied off (vasectomy).This blocks sperm from entering the vagina during sexual intercourse. After the procedure, the man can still ejaculate fluid (semen). Natural planning methods  Natural family planning. This is not having sexual intercourse or using a barrier method (condom, diaphragm, cervical cap) on days the woman could become pregnant.  Calendar method. This is keeping track of the length of each menstrual cycle and identifying when you are fertile.  Ovulation method. This is avoiding sexual intercourse during ovulation.  Symptothermal method.  This is avoiding sexual intercourse during ovulation, using a thermometer and ovulation symptoms.  Post-ovulation method. This is timing sexual intercourse after you have ovulated. Regardless of which type or method of contraception you choose, it is important that you use condoms to protect against the transmission of sexually transmitted infections (STIs). Talk with your health care provider about which form of contraception is most appropriate for you. This information is not intended to replace advice given to you by your health care provider. Make sure you discuss any questions you have with your health care provider. Document Released: 08/19/2005 Document Revised: 01/25/2016 Document Reviewed: 02/11/2013 Elsevier Interactive Patient Education  2017 Elsevier Inc.  

## 2016-09-26 NOTE — Progress Notes (Signed)
   PRENATAL VISIT NOTE  Subjective:  Kaitlyn Whitaker is a 21 y.o. G2P0010 at 5565w1d being seen today for ongoing prenatal care.  She is currently monitored for the following issues for this low-risk pregnancy and has Supervision of normal pregnancy, antepartum on her problem list.  Patient reports occasional contractions.  Contractions: Irregular. Vag. Bleeding: None.  Movement: Present. Denies leaking of fluid.   The following portions of the patient's history were reviewed and updated as appropriate: allergies, current medications, past family history, past medical history, past social history, past surgical history and problem list. Problem list updated.  Objective:   Vitals:   09/26/16 0811  BP: 114/72  Pulse: 82  Weight: 132 lb 1.6 oz (59.9 kg)    Fetal Status: Fetal Heart Rate (bpm): 123 Fundal Height: 34 cm Movement: Present  Presentation: Vertex  General:  Alert, oriented and cooperative. Patient is in no acute distress.  Skin: Skin is warm and dry. No rash noted.   Cardiovascular: Normal heart rate noted  Respiratory: Normal respiratory effort, no problems with respiration noted  Abdomen: Soft, gravid, appropriate for gestational age. Pain/Pressure: Absent     Pelvic:  Cervical exam performed Dilation: 1.5 Effacement (%): 80 Station: -2. Engaged in pelvis.   Extremities: Normal range of motion.  Edema: None  Mental Status: Normal mood and affect. Normal behavior. Normal judgment and thought content.   Assessment and Plan:  Pregnancy: G2P0010 at 8265w1d  1. Supervision of normal first pregnancy, antepartum  - [facility] - Culture, beta strep (group b only)  2. S<D - Growth US this afternoon    Preterm labor symptoms and general obstetric precautions including but not limited to vaginal bleeding, contractions, leaking of fluid and fetal movement were reviewed in detail with the patient. Please refer to After Visit Summary for other  counseling recommendations.  F/U 1 week    [place] Tamarra Geiselman, [place]

## 2016-09-27 ENCOUNTER — Ambulatory Visit (HOSPITAL_COMMUNITY)
Admission: RE | Admit: 2016-09-27 | Discharge: 2016-09-27 | Disposition: A | Discharge status: Home / Self Care | Payer: Medicaid Other | Source: Ambulatory Visit | Attending: Advanced Practice Midwife | Admitting: Advanced Practice Midwife

## 2016-09-27 DIAGNOSIS — O26843 Uterine size-date discrepancy, third trimester: Secondary | ICD-10-CM | POA: Insufficient documentation

## 2016-09-27 DIAGNOSIS — Z3A36 36 weeks gestation of pregnancy: Secondary | ICD-10-CM | POA: Insufficient documentation

## 2016-09-27 LAB — GC/CHLAMYDIA PROBE AMP (~~LOC~~) NOT AT ARMC
Chlamydia: NEGATIVE
NEISSERIA GONORRHEA: NEGATIVE

## 2016-09-28 LAB — CULTURE, BETA STREP (GROUP B ONLY)

## 2016-10-03 ENCOUNTER — Ambulatory Visit (INDEPENDENT_AMBULATORY_CARE_PROVIDER_SITE_OTHER): Payer: Medicaid Other | Admitting: Family

## 2016-10-03 VITALS — BP 127/74 | HR 71 | Wt 136.1 lb

## 2016-10-03 DIAGNOSIS — Z3403 Encounter for supervision of normal first pregnancy, third trimester: Secondary | ICD-10-CM

## 2016-10-03 NOTE — Progress Notes (Signed)
   PRENATAL VISIT NOTE  Subjective:  Kaitlyn Whitaker is a 21 y.o. G2P0010 at 9081w1d being seen today for ongoing prenatal care.  She is currently monitored for the following issues for this low-risk pregnancy and has Supervision of normal pregnancy, antepartum on her problem list.  Patient reports occasional contractions.  Contractions: Irregular. Vag. Bleeding: None.  Movement: Present. Denies leaking of fluid.   The following portions of the patient's history were reviewed and updated as appropriate: allergies, current medications, past family history, past medical history, past social history, past surgical history and problem list. Problem list updated.  Objective:   Vitals:   10/03/16 0804  BP: 127/74  Pulse: 71  Weight: 136 lb 1.6 oz (61.7 kg)    Fetal Status: Fetal Heart Rate (bpm): 123 Fundal Height: 36 cm Movement: Present  Presentation: Vertex  General:  Alert, oriented and cooperative. Patient is in no acute distress.  Skin: Skin is warm and dry. No rash noted.   Cardiovascular: Normal heart rate noted  Respiratory: Normal respiratory effort, no problems with respiration noted  Abdomen: Soft, gravid, appropriate for gestational age. Pain/Pressure: Absent     Pelvic:  Cervical exam deferred Dilation: 2 Effacement (%): 80 Station: 0  Extremities: Normal range of motion.     Mental Status: Normal mood and affect. Normal behavior. Normal judgment and thought content.   Assessment and Plan:  Pregnancy: G2P0010 at 1781w1d  1.  Supervision of Low Risk Pregnancy  Term labor symptoms and general obstetric precautions including but not limited to vaginal bleeding, contractions, leaking of fluid and fetal movement were reviewed in detail with the patient. Please refer to After Visit Summary for other counseling recommendations.   Return in about 1 week (around 10/10/2016).   Eino FarberWalidah Kennith GainN Karim, CNM

## 2016-10-10 ENCOUNTER — Ambulatory Visit (INDEPENDENT_AMBULATORY_CARE_PROVIDER_SITE_OTHER): Payer: Self-pay | Admitting: Medical

## 2016-10-10 VITALS — BP 118/79 | HR 87 | Wt 139.3 lb

## 2016-10-10 DIAGNOSIS — Z3403 Encounter for supervision of normal first pregnancy, third trimester: Secondary | ICD-10-CM

## 2016-10-10 NOTE — Patient Instructions (Signed)
Introduction Patient Name: ________________________________________________ Patient Due Date: ____________________ What is a fetal movement count? A fetal movement count is the number of times that you feel your baby move during a certain amount of time. This may also be called a fetal kick count. A fetal movement count is recommended for every pregnant woman. You may be asked to start counting fetal movements as early as week 28 of your pregnancy. Pay attention to when your baby is most active. You may notice your baby's sleep and wake cycles. You may also notice things that make your baby move more. You should do a fetal movement count:  When your baby is normally most active.  At the same time each day. A good time to count movements is while you are resting, after having something to eat and drink. How do I count fetal movements? 1. Find a quiet, comfortable area. Sit, or lie down on your side. 2. Write down the date, the start time and stop time, and the number of movements that you felt between those two times. Take this information with you to your health care visits. 3. For 2 hours, count kicks, flutters, swishes, rolls, and jabs. You should feel at least 10 movements during 2 hours. 4. You may stop counting after you have felt 10 movements. 5. If you do not feel 10 movements in 2 hours, have something to eat and drink. Then, keep resting and counting for 1 hour. If you feel at least 4 movements during that hour, you may stop counting. Contact a health care provider if:  You feel fewer than 4 movements in 2 hours.  Your baby is not moving like he or she usually does. Date: ____________ Start time: ____________ Stop time: ____________ Movements: ____________ Date: ____________ Start time: ____________ Stop time: ____________ Movements: ____________ Date: ____________ Start time: ____________ Stop time: ____________ Movements: ____________ Date: ____________ Start time: ____________  Stop time: ____________ Movements: ____________ Date: ____________ Start time: ____________ Stop time: ____________ Movements: ____________ Date: ____________ Start time: ____________ Stop time: ____________ Movements: ____________ Date: ____________ Start time: ____________ Stop time: ____________ Movements: ____________ Date: ____________ Start time: ____________ Stop time: ____________ Movements: ____________ Date: ____________ Start time: ____________ Stop time: ____________ Movements: ____________ This information is not intended to replace advice given to you by your health care provider. Make sure you discuss any questions you have with your health care provider. Document Released: 09/18/2006 Document Revised: 04/17/2016 Document Reviewed: 09/28/2015 Elsevier Interactive Patient Education  2017 Elsevier Inc. Braxton Hicks Contractions Contractions of the uterus can occur throughout pregnancy. Contractions are not always a sign that you are in labor.  WHAT ARE BRAXTON HICKS CONTRACTIONS?  Contractions that occur before labor are called Braxton Hicks contractions, or false labor. Toward the end of pregnancy (32-34 weeks), these contractions can develop more often and may become more forceful. This is not true labor because these contractions do not result in opening (dilatation) and thinning of the cervix. They are sometimes difficult to tell apart from true labor because these contractions can be forceful and people have different pain tolerances. You should not feel embarrassed if you go to the hospital with false labor. Sometimes, the only way to tell if you are in true labor is for your health care provider to look for changes in the cervix. If there are no prenatal problems or other health problems associated with the pregnancy, it is completely safe to be sent home with false labor and await the onset of true labor.   HOW CAN YOU TELL THE DIFFERENCE BETWEEN TRUE AND FALSE LABOR? False Labor     The contractions of false labor are usually shorter and not as hard as those of true labor.   The contractions are usually irregular.   The contractions are often felt in the front of the lower abdomen and in the groin.   The contractions may go away when you walk around or change positions while lying down.   The contractions get weaker and are shorter lasting as time goes on.   The contractions do not usually become progressively stronger, regular, and closer together as with true labor.  True Labor   Contractions in true labor last 30-70 seconds, become very regular, usually become more intense, and increase in frequency.   The contractions do not go away with walking.   The discomfort is usually felt in the top of the uterus and spreads to the lower abdomen and low back.   True labor can be determined by your health care provider with an exam. This will show that the cervix is dilating and getting thinner.  WHAT TO REMEMBER  Keep up with your usual exercises and follow other instructions given by your health care provider.   Take medicines as directed by your health care provider.   Keep your regular prenatal appointments.   Eat and drink lightly if you think you are going into labor.   If Braxton Hicks contractions are making you uncomfortable:   Change your position from lying down or resting to walking, or from walking to resting.   Sit and rest in a tub of warm water.   Drink 2-3 glasses of water. Dehydration may cause these contractions.   Do slow and deep breathing several times an hour.  WHEN SHOULD I SEEK IMMEDIATE MEDICAL CARE? Seek immediate medical care if:  Your contractions become stronger, more regular, and closer together.   You have fluid leaking or gushing from your vagina.   You have a fever.   You pass blood-tinged mucus.   You have vaginal bleeding.   You have continuous abdominal pain.   You have low back pain  that you never had before.   You feel your baby's head pushing down and causing pelvic pressure.   Your baby is not moving as much as it used to.  This information is not intended to replace advice given to you by your health care provider. Make sure you discuss any questions you have with your health care provider. Document Released: 08/19/2005 Document Revised: 12/11/2015 Document Reviewed: 05/31/2013 Elsevier Interactive Patient Education  2017 Elsevier Inc.  

## 2016-10-10 NOTE — Progress Notes (Signed)
   PRENATAL VISIT NOTE  Subjective:  Kaitlyn Whitaker is a 21 y.o. G2P0010 at 191w1d being seen today for ongoing prenatal care.  She is currently monitored for the following issues for this low-risk pregnancy and has Supervision of normal pregnancy, antepartum on her problem list.  Patient reports no complaints.  Contractions: Irregular. Vag. Bleeding: None.  Movement: Present. Denies leaking of fluid.   The following portions of the patient's history were reviewed and updated as appropriate: allergies, current medications, past family history, past medical history, past social history, past surgical history and problem list. Problem list updated.  Objective:   Vitals:   10/10/16 0843  BP: 118/79  Pulse: 87  Weight: 139 lb 4.8 oz (63.2 kg)    Fetal Status: Fetal Heart Rate (bpm): 123 Fundal Height: 37 cm Movement: Present  Presentation: Vertex  General:  Alert, oriented and cooperative. Patient is in no acute distress.  Skin: Skin is warm and dry. No rash noted.   Cardiovascular: Normal heart rate noted  Respiratory: Normal respiratory effort, no problems with respiration noted  Abdomen: Soft, gravid, appropriate for gestational age. Pain/Pressure: Absent     Pelvic:  Cervical exam performed Dilation: 2 Effacement (%): 80 Station: 0  Extremities: Normal range of motion.  Edema: None  Mental Status: Normal mood and affect. Normal behavior. Normal judgment and thought content.   Assessment and Plan:  Pregnancy: G2P0010 at 211w1d  1. Encounter for supervision of normal first pregnancy in third trimester - No complaints, patient doing well  Term labor symptoms and general obstetric precautions including but not limited to vaginal bleeding, contractions, leaking of fluid and fetal movement were reviewed in detail with the patient. Please refer to After Visit Summary for other counseling recommendations.  Return in about 1 week (around 10/17/2016) for LOB.   Marny LowensteinJulie N Myeasha Ballowe, PA-C

## 2016-10-15 ENCOUNTER — Inpatient Hospital Stay (HOSPITAL_COMMUNITY): Payer: Medicaid Other | Admitting: Anesthesiology

## 2016-10-15 ENCOUNTER — Inpatient Hospital Stay (HOSPITAL_COMMUNITY)
Admission: AD | Admit: 2016-10-15 | Discharge: 2016-10-17 | DRG: 775 | Disposition: A | Discharge status: Home / Self Care | Payer: Medicaid Other | Source: Ambulatory Visit | Attending: Obstetrics & Gynecology | Admitting: Obstetrics & Gynecology

## 2016-10-15 ENCOUNTER — Encounter (HOSPITAL_COMMUNITY): Payer: Self-pay | Admitting: *Deleted

## 2016-10-15 DIAGNOSIS — Z3490 Encounter for supervision of normal pregnancy, unspecified, unspecified trimester: Secondary | ICD-10-CM

## 2016-10-15 DIAGNOSIS — Z833 Family history of diabetes mellitus: Secondary | ICD-10-CM | POA: Diagnosis not present

## 2016-10-15 DIAGNOSIS — Z3A38 38 weeks gestation of pregnancy: Secondary | ICD-10-CM

## 2016-10-15 DIAGNOSIS — Z3493 Encounter for supervision of normal pregnancy, unspecified, third trimester: Secondary | ICD-10-CM | POA: Diagnosis present

## 2016-10-15 HISTORY — DX: Dorsalgia, unspecified: M54.9

## 2016-10-15 LAB — CBC
HEMATOCRIT: 30.2 % — AB (ref 36.0–46.0)
Hemoglobin: 10.4 g/dL — ABNORMAL LOW (ref 12.0–15.0)
MCH: 27.2 pg (ref 26.0–34.0)
MCHC: 34.4 g/dL (ref 30.0–36.0)
MCV: 79.1 fL (ref 78.0–100.0)
Platelets: 258 10*3/uL (ref 150–400)
RBC: 3.82 MIL/uL — ABNORMAL LOW (ref 3.87–5.11)
RDW: 13.6 % (ref 11.5–15.5)
WBC: 12.7 10*3/uL — AB (ref 4.0–10.5)

## 2016-10-15 LAB — ABO/RH: ABO/RH(D): B POS

## 2016-10-15 LAB — TYPE AND SCREEN
ABO/RH(D): B POS
Antibody Screen: NEGATIVE

## 2016-10-15 MED ORDER — LACTATED RINGERS IV SOLN
500.0000 mL | Freq: Once | INTRAVENOUS | Status: AC
  Administered 2016-10-15: 500 mL via INTRAVENOUS

## 2016-10-15 MED ORDER — LIDOCAINE HCL (PF) 1 % IJ SOLN
INTRAMUSCULAR | Status: DC | PRN
  Administered 2016-10-15: 5 mL via EPIDURAL
  Administered 2016-10-15: 4 mL via EPIDURAL
  Administered 2016-10-15: 7 mL via EPIDURAL

## 2016-10-15 MED ORDER — OXYCODONE-ACETAMINOPHEN 5-325 MG PO TABS: 1.0000 | ORAL_TABLET | ORAL | Status: DC | PRN

## 2016-10-15 MED ORDER — OXYTOCIN BOLUS FROM INFUSION
500.0000 mL | Freq: Once | INTRAVENOUS | Status: AC
  Administered 2016-10-15: 500 mL via INTRAVENOUS

## 2016-10-15 MED ORDER — FLEET ENEMA 7-19 GM/118ML RE ENEM: 1.0000 | ENEMA | RECTAL | Status: DC | PRN

## 2016-10-15 MED ORDER — OXYCODONE-ACETAMINOPHEN 5-325 MG PO TABS: 2.0000 | ORAL_TABLET | ORAL | Status: DC | PRN

## 2016-10-15 MED ORDER — BENZOCAINE-MENTHOL 20-0.5 % EX AERO
1.0000 "application " | INHALATION_SPRAY | CUTANEOUS | Status: DC | PRN
  Administered 2016-10-15: 1 via TOPICAL
  Filled 2016-10-15 (×2): qty 56

## 2016-10-15 MED ORDER — ZOLPIDEM TARTRATE 5 MG PO TABS: 5.0000 mg | ORAL_TABLET | Freq: Every evening | ORAL | Status: DC | PRN

## 2016-10-15 MED ORDER — SODIUM CHLORIDE 0.9% FLUSH: 3.0000 mL | INTRAVENOUS | Status: DC | PRN

## 2016-10-15 MED ORDER — DIPHENHYDRAMINE HCL 25 MG PO CAPS: 25.0000 mg | ORAL_CAPSULE | Freq: Four times a day (QID) | ORAL | Status: DC | PRN

## 2016-10-15 MED ORDER — SENNOSIDES-DOCUSATE SODIUM 8.6-50 MG PO TABS
2.0000 | ORAL_TABLET | ORAL | Status: DC
  Administered 2016-10-16 – 2016-10-17 (×2): 2 via ORAL
  Filled 2016-10-15 (×2): qty 2

## 2016-10-15 MED ORDER — LACTATED RINGERS IV SOLN: 500.0000 mL | INTRAVENOUS | Status: DC | PRN

## 2016-10-15 MED ORDER — EPHEDRINE 5 MG/ML INJ
10.0000 mg | INTRAVENOUS | Status: DC | PRN
  Administered 2016-10-15: 10 mg via INTRAVENOUS
  Filled 2016-10-15: qty 4

## 2016-10-15 MED ORDER — OXYTOCIN 40 UNITS IN LACTATED RINGERS INFUSION - SIMPLE MED: 2.5000 [IU]/h | INTRAVENOUS | Status: DC | PRN

## 2016-10-15 MED ORDER — PHENYLEPHRINE 40 MCG/ML (10ML) SYRINGE FOR IV PUSH (FOR BLOOD PRESSURE SUPPORT)
80.0000 ug | PREFILLED_SYRINGE | INTRAVENOUS | Status: DC | PRN
  Administered 2016-10-15 (×2): 80 ug via INTRAVENOUS
  Filled 2016-10-15: qty 5

## 2016-10-15 MED ORDER — EPHEDRINE 5 MG/ML INJ
10.0000 mg | INTRAVENOUS | Status: DC | PRN
  Administered 2016-10-15: 10 mg via INTRAVENOUS
  Filled 2016-10-15 (×2): qty 4

## 2016-10-15 MED ORDER — OXYTOCIN 40 UNITS IN LACTATED RINGERS INFUSION - SIMPLE MED
1.0000 m[IU]/min | INTRAVENOUS | Status: DC
  Administered 2016-10-15: 2 m[IU]/min via INTRAVENOUS
  Filled 2016-10-15: qty 1000

## 2016-10-15 MED ORDER — ONDANSETRON HCL 4 MG PO TABS: 4.0000 mg | ORAL_TABLET | ORAL | Status: DC | PRN

## 2016-10-15 MED ORDER — WITCH HAZEL-GLYCERIN EX PADS: 1.0000 "application " | MEDICATED_PAD | CUTANEOUS | Status: DC | PRN

## 2016-10-15 MED ORDER — COCONUT OIL OIL: 1.0000 "application " | TOPICAL_OIL | Status: DC | PRN

## 2016-10-15 MED ORDER — ACETAMINOPHEN 325 MG PO TABS
650.0000 mg | ORAL_TABLET | ORAL | Status: DC | PRN
  Administered 2016-10-16 (×2): 650 mg via ORAL
  Filled 2016-10-15 (×2): qty 2

## 2016-10-15 MED ORDER — DIPHENHYDRAMINE HCL 50 MG/ML IJ SOLN: 12.5000 mg | INTRAMUSCULAR | Status: DC | PRN

## 2016-10-15 MED ORDER — IBUPROFEN 600 MG PO TABS
600.0000 mg | ORAL_TABLET | Freq: Four times a day (QID) | ORAL | Status: DC
  Administered 2016-10-15 – 2016-10-17 (×7): 600 mg via ORAL
  Filled 2016-10-15 (×7): qty 1

## 2016-10-15 MED ORDER — PRENATAL MULTIVITAMIN CH
1.0000 | ORAL_TABLET | Freq: Every day | ORAL | Status: DC
  Administered 2016-10-16 – 2016-10-17 (×2): 1 via ORAL
  Filled 2016-10-15 (×2): qty 1

## 2016-10-15 MED ORDER — TERBUTALINE SULFATE 1 MG/ML IJ SOLN
0.2500 mg | Freq: Once | INTRAMUSCULAR | Status: DC | PRN
  Filled 2016-10-15: qty 1

## 2016-10-15 MED ORDER — FENTANYL CITRATE (PF) 100 MCG/2ML IJ SOLN
100.0000 ug | INTRAMUSCULAR | Status: DC | PRN
  Administered 2016-10-15 (×3): 100 ug via INTRAVENOUS
  Filled 2016-10-15 (×3): qty 2

## 2016-10-15 MED ORDER — SODIUM CHLORIDE 0.9% FLUSH: 3.0000 mL | Freq: Two times a day (BID) | INTRAVENOUS | Status: DC

## 2016-10-15 MED ORDER — OXYTOCIN 40 UNITS IN LACTATED RINGERS INFUSION - SIMPLE MED
2.5000 [IU]/h | INTRAVENOUS | Status: DC
  Administered 2016-10-15: 2.5 [IU]/h via INTRAVENOUS

## 2016-10-15 MED ORDER — LACTATED RINGERS IV SOLN
INTRAVENOUS | Status: DC
  Administered 2016-10-15: 14:00:00 via INTRAVENOUS

## 2016-10-15 MED ORDER — SODIUM CHLORIDE 0.9 % IV SOLN: 250.0000 mL | INTRAVENOUS | Status: DC | PRN

## 2016-10-15 MED ORDER — LIDOCAINE HCL (PF) 1 % IJ SOLN
30.0000 mL | INTRAMUSCULAR | Status: DC | PRN
  Administered 2016-10-15: 30 mL via SUBCUTANEOUS
  Filled 2016-10-15: qty 30

## 2016-10-15 MED ORDER — DIBUCAINE 1 % RE OINT: 1.0000 "application " | TOPICAL_OINTMENT | RECTAL | Status: DC | PRN

## 2016-10-15 MED ORDER — PHENYLEPHRINE 40 MCG/ML (10ML) SYRINGE FOR IV PUSH (FOR BLOOD PRESSURE SUPPORT)
80.0000 ug | PREFILLED_SYRINGE | INTRAVENOUS | Status: DC | PRN
  Filled 2016-10-15: qty 5
  Filled 2016-10-15: qty 10

## 2016-10-15 MED ORDER — FENTANYL 2.5 MCG/ML BUPIVACAINE 1/10 % EPIDURAL INFUSION (WH - ANES)
14.0000 mL/h | INTRAMUSCULAR | Status: DC | PRN
  Administered 2016-10-15: 14 mL/h via EPIDURAL
  Filled 2016-10-15: qty 100

## 2016-10-15 MED ORDER — ACETAMINOPHEN 325 MG PO TABS: 650.0000 mg | ORAL_TABLET | ORAL | Status: DC | PRN

## 2016-10-15 MED ORDER — SOD CITRATE-CITRIC ACID 500-334 MG/5ML PO SOLN: 30.0000 mL | ORAL | Status: DC | PRN

## 2016-10-15 MED ORDER — ONDANSETRON HCL 4 MG/2ML IJ SOLN
4.0000 mg | Freq: Four times a day (QID) | INTRAMUSCULAR | Status: DC | PRN
  Administered 2016-10-15: 4 mg via INTRAVENOUS
  Filled 2016-10-15: qty 2

## 2016-10-15 MED ORDER — ONDANSETRON HCL 4 MG/2ML IJ SOLN: 4.0000 mg | INTRAMUSCULAR | Status: DC | PRN

## 2016-10-15 MED ORDER — SIMETHICONE 80 MG PO CHEW: 80.0000 mg | CHEWABLE_TABLET | ORAL | Status: DC | PRN

## 2016-10-15 MED ORDER — TETANUS-DIPHTH-ACELL PERTUSSIS 5-2.5-18.5 LF-MCG/0.5 IM SUSP: 0.5000 mL | Freq: Once | INTRAMUSCULAR | Status: DC

## 2016-10-15 NOTE — Anesthesia Procedure Notes (Signed)
Epidural Patient location during procedure: OB Start time: 10/15/2016 1:45 PM End time: 10/15/2016 1:49 PM  Staffing Anesthesiologist: Leilani AbleHATCHETT, Dravon Nott Performed: anesthesiologist   Preanesthetic Checklist Completed: patient identified, surgical consent, pre-op evaluation, timeout performed, IV checked, risks and benefits discussed and monitors and equipment checked  Epidural Patient position: sitting Prep: site prepped and draped and DuraPrep Patient monitoring: continuous pulse ox and blood pressure Approach: midline Location: L4-L5 Injection technique: LOR air  Needle:  Needle type: Tuohy  Needle gauge: 17 G Needle length: 9 cm and 9 Needle insertion depth: 7 cm Catheter type: closed end flexible Catheter size: 19 Gauge Catheter at skin depth: 10 cm Test dose: negative and Other  Assessment Events: blood not aspirated, injection not painful, no injection resistance, negative IV test and no paresthesia  Additional Notes Reason for block:procedure for pain

## 2016-10-15 NOTE — MAU Note (Addendum)
PT  SAYS SHE HURTS   WITH UC  SINCE 0145.    VE  IN CLINIC  2 CM    DENIES HSV AND MRSA.  GBS-NEG

## 2016-10-15 NOTE — Anesthesia Procedure Notes (Signed)
Epidural Patient location during procedure: OB Start time: 10/15/2016 10:50 AM End time: 10/15/2016 10:57 AM  Staffing Anesthesiologist: Leilani AbleHATCHETT, Violeta Lecount Performed: anesthesiologist   Preanesthetic Checklist Completed: patient identified, surgical consent, pre-op evaluation, timeout performed, IV checked, risks and benefits discussed and monitors and equipment checked  Epidural Patient position: sitting Prep: site prepped and draped and DuraPrep Patient monitoring: continuous pulse ox and blood pressure Approach: midline Location: L3-L4 Injection technique: LOR air  Needle:  Needle type: Tuohy  Needle gauge: 17 G Needle length: 9 cm and 9 Needle insertion depth: 6 cm Catheter type: closed end flexible Catheter size: 19 Gauge Catheter at skin depth: 11 cm Test dose: negative and Other  Assessment Sensory level: T9 Events: blood not aspirated, injection not painful, no injection resistance, negative IV test and no paresthesia  Additional Notes Reason for block:procedure for pain

## 2016-10-15 NOTE — Anesthesia Pain Management Evaluation Note (Signed)
  CRNA Pain Management Visit Note  Patient: Kaitlyn Whitaker, 21 y.o., female  "Hello I am a [facility] of the [facility]. We have an anesthesia team available at all times to provide care throughout the hospital, including epidural management and anesthesia for C-section. I don't know your plan for the delivery whether it a natural birth, water birth, IV sedation, nitrous supplementation, doula or epidural, but we want to meet your pain goals."   1.Was your pain managed to your expectations on prior hospitalizations?   No prior hospitalizations  2.What is your expectation for pain management during this hospitalization?     Epidural and IV pain meds  3.How can we help you reach that goal? IV pain meds, epidural.  Record the patient's initial score and the patient's pain goal.   Pain: 4--although this is higher than her pain goal, patient states that she is OK.  I told patient that if she is having more pain than she wants to feel, to tell her L&D RN.  Pain Goal: 3 [facility] wants you to be able to say your pain was always managed very well.  [name] 10/15/2016

## 2016-10-15 NOTE — Anesthesia Preprocedure Evaluation (Addendum)
Anesthesia Evaluation  Patient identified by MRN, date of birth, ID band Patient awake    Reviewed: Allergy & Precautions, H&P , NPO status , Patient's Chart, lab work & pertinent test results  Airway Mallampati: I  TM Distance: >3 FB Neck ROM: full    Dental no notable dental hx.    Pulmonary former smoker,    Pulmonary exam normal        Cardiovascular negative cardio ROS Normal cardiovascular exam     Neuro/Psych negative neurological ROS  negative psych ROS   GI/Hepatic Neg liver ROS,   Endo/Other  negative endocrine ROS  Renal/GU negative Renal ROS     Musculoskeletal   Abdominal Normal abdominal exam  (+)   Peds  Hematology negative hematology ROS (+)   Anesthesia Other Findings   Reproductive/Obstetrics (+) Pregnancy                             Anesthesia Physical Anesthesia Plan  ASA: II  Anesthesia Plan: Epidural   Post-op Pain Management:    Induction:   Airway Management Planned:   Additional Equipment:   Intra-op Plan:   Post-operative Plan:   Informed Consent: I have reviewed the patients History and Physical, chart, labs and discussed the procedure including the risks, benefits and alternatives for the proposed anesthesia with the patient or authorized representative who has indicated his/her understanding and acceptance.     Plan Discussed with:   Anesthesia Plan Comments:         Anesthesia Quick Evaluation

## 2016-10-15 NOTE — H&P (Signed)
LABOR AND DELIVERY ADMISSION HISTORY AND PHYSICAL NOTE  Kaitlyn Whitaker is a 21 y.o. female G2P0010 with IUP at [redacted]w[redacted]d by [place] presenting for SOL. Presented to [facility] and was initially 4cm and 80% effaced. Patient walked around and was rechecked after one hour and found to be 5cm, 80% and 0 station.  Denies HA, changes in vision, NVD or LE edema.  She reports positive fetal movement. She denies leakage of fluid or vaginal bleeding.   Clinic  [facility] Prenatal Labs  Dating [facility] 2/21 LMP=13wk u/s Blood type:   B+  Genetic Screen  declined Antibody: neg   Anatomic [place]  neg Rubella:  Immune  GTT Early:   n/a            Third trimester:78-97-69 nml RPR:   non-reactive   Flu vaccine  05/28/2016 HBsAg:   neg  TDaP vaccine  08/01/16                                    Rhogam: n/a HIV:   negative   Baby Food       breast                                        GBS: Neg (For PCN allergy, check sensitivities)   Prenatal History/Complications:  Past Medical History: Past Medical History:  Diagnosis Date  . GERD (gastroesophageal reflux disease)     Past Surgical History: Past Surgical History:  Procedure Laterality Date  . TONSILLECTOMY      Obstetrical History: OB History    Gravida Para Term Preterm AB Living   2       1     SAB TAB Ectopic Multiple Live Births   1              Social History: Social History   Social History  . Marital status: Single    Spouse name: N/A  . Number of children: N/A  . Years of education: N/A   Social History Main Topics  . Smoking status: Never Smoker  . Smokeless tobacco: Never Used  . Alcohol use No  . Drug use: No  . Sexual activity: No   Other Topics Concern  . None   Social History Narrative  . None    Family History: Family History  Problem Relation Age of Onset  . Diabetes Maternal Uncle     Allergies: No Known Allergies  Prescriptions Prior to Admission  Medication Sig Dispense Refill Last Dose  . acetaminophen (TYLENOL) 500 MG  tablet Take 500 mg by mouth every 6 (six) hours as needed.   10/14/2016 at Unknown time  . Prenatal Vit-Fe Fumarate-FA (PRENATAL VITAMIN) 27-0.8 MG TABS Take 1 tablet by mouth daily.   10/14/2016 at Unknown time     Review of Systems   All systems reviewed and negative except as stated in HPI  Blood pressure 112/73, pulse 77, temperature 97.9 F (36.6 C), temperature source Oral, resp. rate 20, last menstrual period 01/17/2016. General appearance: alert and cooperative Lungs: clear to auscultation bilaterally Heart: regular rate and rhythm Abdomen: soft, non-tender; bowel sounds normal Extremities: No calf swelling or tenderness Presentation: cephalic Fetal monitoring: FHR 140, mod variability, pos accels, no decels, contractions q2-91min Uterine activity:  Dilation: 5 Effacement (%): 80 Station: 0 Exam by:: DCALLAWAY, RN  Prenatal labs: ABO, Rh: B/Positive/-- (07/31 0000) Antibody: Negative (07/31 0000) Rubella: Immune RPR: NON REAC (12/07 0936)  HBsAg: Negative (07/31 0000)  HIV: NONREACTIVE (12/07 0936)  GBS: Negative (01/25 0000)  1 hr Glucola: 2hr normal Genetic screening: declined Anatomy US: normal  Prenatal Transfer Tool  Maternal Diabetes: No Genetic Screening: Declined Maternal Ultrasounds/Referrals: Normal Fetal Ultrasounds or other Referrals:  None Maternal Substance Abuse:  No Significant Maternal Medications:  None Significant Maternal Lab Results: None  No results found for this or any previous visit (from the past 24 hour(s)).  Patient Active Problem List   Diagnosis Date Noted  . Supervision of normal pregnancy, antepartum 05/23/2016    Assessment: Kaitlyn CourtJillian Rusconi is a 21 y.o. G2P0010 at 3420w6d here for SOL. Denies LOF or vaginal bleeding.   #Labor: SOL, continue expectant management, anticipate SVD #Pain: IV pain medication #FWB:  Cat 1 #ID: GBS neg #MOF: breast #MOC: undecided #Circ: outpatient  Kaitlyn Muscaaniel L Warden, MD PGY-1 10/15/2016,  5:59 AM  Midwife attestation: I have seen and examined this patient; I agree with above documentation in the resident's note.   Kaitlyn Whitaker is a 21 y.o. G2P0010 @[redacted]w[redacted]d  here for SOL  PE: BP 129/75   Pulse 89   Temp 97.8 F (36.6 C)   Resp 16   Ht 5\' 5"  (1.651 m)   Wt 138 lb (62.6 kg)   LMP 01/17/2016 (Exact Date)   BMI 22.96 kg/m  Gen: calm comfortable, NAD Resp: normal effort, no distress Abd: gravid  ROS, labs, PMH reviewed  Plan: Admit to LD Labor: Expectant management FWB: Category I ID: GBS neg  Sharen CounterLisa Leftwich-Kirby, CNM  10/15/2016, 7:38 AM

## 2016-10-16 LAB — RPR: RPR Ser Ql: NONREACTIVE

## 2016-10-16 NOTE — Progress Notes (Signed)
POSTPARTUM PROGRESS NOTE  Post Partum Day #1 Subjective:  Kaitlyn Whitaker is a 21 y.o. G2P1011 5346w6d s/p SVD.  No acute events overnight.  Pt denies problems with ambulating, voiding or po intake.  She denies nausea or vomiting.  Pain is well controlled.  She has had flatus. She has not had bowel movement.  Lochia Minimal.   Objective: Blood pressure 120/62, pulse 77, temperature 97.9 F (36.6 C), temperature source Oral, resp. rate 18, height 5\' 5"  (1.651 m), weight 62.6 kg (138 lb), last menstrual period 01/17/2016, SpO2 100 %, unknown if currently breastfeeding.  Physical Exam:  General: alert, cooperative and no distress Lochia:normal flow Chest: CTAB Heart: RRR no m/r/g Abdomen: +BS, soft, nontender,  Uterine Fundus: firm DVT Evaluation: No calf swelling or tenderness Extremities: no edema   Recent Labs  10/15/16 0600  HGB 10.4*  HCT 30.2*    Assessment/Plan:  ASSESSMENT: Kaitlyn Whitaker is a 21 y.o. G2P1011 5946w6d s/p SVD. Late delivery last night. Doing well with no concerns.   Plan for discharge tomorrow   LOS: 1 day   Renne Muscaaniel L Warden, MD PGY-1 [facility]  10/16/2016, 9:12 AM   OB FELLOW POSTPARTUM PROGRESS NOTE ATTESTATION  I have seen and examined this patient and agree with above documentation in the resident's note.   Ernestina PennaNicholas Cypress Hinkson, MD 7:46 PM

## 2016-10-16 NOTE — Anesthesia Postprocedure Evaluation (Signed)
Anesthesia Post Note  Patient: Kaitlyn CourtJillian Dumont  Procedure(s) Performed: * No procedures listed *  Patient location during evaluation: Mother Baby Anesthesia Type: Epidural Level of consciousness: awake, awake and alert, oriented and patient cooperative Pain management: pain level controlled Vital Signs Assessment: post-procedure vital signs reviewed and stable Respiratory status: spontaneous breathing, nonlabored ventilation and respiratory function stable Cardiovascular status: stable Postop Assessment: no headache, no backache, no signs of nausea or vomiting and patient able to bend at knees Anesthetic complications: no        Last Vitals:  Vitals:   10/15/16 2246 10/16/16 0245  BP: (!) 130/58 120/62  Pulse: 86 77  Resp: 18 18  Temp: 36.6 C 36.6 C    Last Pain:  Vitals:   10/16/16 0520  TempSrc:   PainSc: 2    Pain Goal: Patients Stated Pain Goal: 2 (10/15/16 2137)               Aman Bonet L

## 2016-10-16 NOTE — Progress Notes (Signed)
UR chart review completed.  

## 2016-10-16 NOTE — Lactation Note (Addendum)
This note was copied from a baby's chart. Lactation Consultation Note  Patient Name: Boy Charlynn CourtJillian Karlen ZOXWR'UToday's Date: 10/16/2016 Reason for consult: Initial assessment   With this first time mom and term baby, now 3215 hours old. Mom has been finger feeding expressed colostrum, due to baby fussy with latch. On exam of baby's mouth, he has an upper lip frenulum that extends to the gum line, and posterior, short frenuulm, white with elevation,  and surrounded by thick tissue. I showed these findings to mom, and how with extension his tongue only extends just over the gum line, making latching difficult. I fitted mom with a 20 nipple shield, after about 10 minutes of baby skin to akin, he latched deeply with a strong, rhythmic suckle, and great breast moveme noted. Visible swallows seen. He was still awake after the feeding. MGF held baby, so mom could eat and then pump. DEP set up, with 21 flanges, in initiation setting, to be followed with hand expression.Mom advised to pump every 3 hours, after BF.  Mom knows how to finger and spoon feed. Mom will call for questions/conerns. [facility] This note faxed to Dr. Talmage NapPuzio.   Maternal Data Formula Feeding for Exclusion: Yes Reason for exclusion: Mother's choice to formula and breast feed on admission Has patient been taught Hand Expression?: Yes Does the patient have breastfeeding experience prior to this delivery?: No  Feeding Feeding Type: Breast Fed Length of feed: 15 min (with nipple shield, active sucking, deep)  LATCH Score/Interventions Latch: Repeated attempts needed to sustain latch, nipple held in mouth throughout feeding, stimulation needed to elicit sucking reflex. Intervention(s): Adjust position;Assist with latch;Breast massage;Breast compression  Audible Swallowing: A few with stimulation (drops of colostrum with hand expression, tinny drops fo clostrum seen in shield after feeding) Intervention(s): Skin to  skin;Hand expression  Type of Nipple: Everted at rest and after stimulation  Comfort (Breast/Nipple): Soft / non-tender     Hold (Positioning): Assistance needed to correctly position infant at breast and maintain latch. Intervention(s): Breastfeeding basics reviewed;Support Pillows;Position options;Skin to skin  LATCH Score: 7  Lactation Tools Discussed/Used Tools: Nipple Shields Nipple shield size: 20 WIC Program: Yes (fax to be sent for mom to recieve a DEP) Pump Review: Setup, frequency, and cleaning;Milk Storage;Other (comment) (HE, BF teaching, pump settings) Initiated by:: Juana Haralson Nedra HaiLee, RN, IBCLC Date initiated:: 10/16/16   Consult Status Consult Status: Follow-up Date: 10/17/16 Follow-up type: In-patient    Alfred LevinsLee, Conita Amenta Anne 10/16/2016, 11:32 AM

## 2016-10-16 NOTE — Plan of Care (Signed)
Problem: Role Relationship: Goal: Ability to demonstrate positive interaction with newborn will improve Outcome: Progressing Patient's mother and father have been very supportive of the patient and assisting with newborn care.

## 2016-10-16 NOTE — Plan of Care (Signed)
Problem: Nutritional: Goal: Mothers verbalization of comfort with breastfeeding process will improve Outcome: Progressing Patient/ newborn have had a lactation consultation and have a feeding plan to include latching with a nipple shield, pumping and finger/spoon feeding expressed milk post breastfeeding. Nursing will assist and reinforce. Lactation to follow patient.

## 2016-10-17 ENCOUNTER — Encounter: Payer: Medicaid Other | Admitting: Student

## 2016-10-17 MED ORDER — IBUPROFEN 600 MG PO TABS: 600.0000 mg | ORAL_TABLET | Freq: Four times a day (QID) | ORAL | 0 refills | Status: DC | PRN

## 2016-10-17 NOTE — Discharge Instructions (Signed)

## 2016-10-17 NOTE — Discharge Summary (Signed)
OB Discharge Summary     Patient Name: Kaitlyn Whitaker DOB: 03/18/1996 MRN: [id number]  Date of admission: 10/15/2016 Delivering MD: Michaele OfferMUMAW, ELIZABETH WOODLAND   Date of discharge: 10/17/2016  Admitting diagnosis: 1949w6d, Contractions; Back and Leg Pain  Intrauterine pregnancy: 6349w6d     Secondary diagnosis:  Active Problems:   Pregnancy  Additional problems: none     Discharge diagnosis: Term Pregnancy Delivered                                                                                                Post partum procedures:none  Augmentation: Pitocin  Complications: None  Hospital course:  Onset of Labor With Vaginal Delivery     21 y.o. yo G2P1011 at 2049w6d was admitted in Latent Labor on 10/15/2016. Patient had an uncomplicated labor course as follows:  Membrane Rupture Time/Date: 11:32 AM ,10/15/2016   Intrapartum Procedures: Episiotomy: None [1]                                         Lacerations:  1st degree [2];Periurethral [8]  Patient had a delivery of a Viable infant. 10/15/2016  Information for the patient's newborn:  Lydia GuilesBarnowski, Boy Shereka [[id number]][[id number]]  Delivery Method: Vag-Spont    Pateint had an uncomplicated postpartum course.  She is ambulating, tolerating a regular diet, passing flatus, and urinating well. Patient is discharged home in stable condition on 10/17/16.   Physical exam  Vitals:   10/16/16 0245 10/16/16 0930 10/16/16 1801 10/17/16 0621  BP: 120/62 109/60 125/76 115/70  Pulse: 77 66 70 61  Resp: 18 18 18 16   Temp: 97.9 F (36.6 C) 97.9 F (36.6 C) 97.9 F (36.6 C) 97.8 F (36.6 C)  TempSrc: Oral Oral Oral Oral  SpO2:      Weight:      Height:       General: alert and cooperative Lochia: appropriate Uterine Fundus: firm Incision: N/A DVT Evaluation: No evidence of DVT seen on physical exam. Labs: Lab Results  Component Value Date   WBC 12.7 (H) 10/15/2016   HGB 10.4 (L) 10/15/2016   HCT 30.2 (L) 10/15/2016   MCV 79.1  10/15/2016   PLT 258 10/15/2016   No flowsheet data found.  Discharge instruction: per After Visit Summary and "Baby and Me Booklet".  After visit meds:  Allergies as of 10/17/2016   No Known Allergies     Medication List    STOP taking these medications   acetaminophen 500 MG tablet Commonly known as:  TYLENOL     TAKE these medications   ibuprofen 600 MG tablet Commonly known as:  ADVIL,MOTRIN Take 1 tablet (600 mg total) by mouth every 6 (six) hours as needed.   Prenatal Vitamin 27-0.8 MG Tabs Take 1 tablet by mouth daily.       Diet: routine diet  Activity: Advance as tolerated. Pelvic rest for 6 weeks.   Outpatient follow up:6 weeks Follow up Appt:Future Appointments Date Time Provider Department Center  11/27/2016 8:40 AM  Dorathy Kinsman, CNM [facility]   Follow up Visit:No Follow-up on file.  Postpartum contraception: Undecided  Newborn Data: Live born female  Birth Weight: 7 lb 6.8 oz (3368 g) APGAR: 8, 9  Baby Feeding: Breast Disposition:home with mother   10/17/2016 Cam Hai, CNM  9:30 AM

## 2016-10-18 ENCOUNTER — Ambulatory Visit: Payer: Self-pay

## 2016-10-18 NOTE — Lactation Note (Signed)
This note was copied from a baby's chart. Lactation Consultation Note Mom wishes no longer to BF. Is strictly bottle/formula feeding. Mom didn't feel BF was for her and the baby is doing better since she has stopped BF. Explained BF probably didn't cause the baby to spit up, that was mucous in the baby's abdomin that needed to come up. Discussed engorgement management d/t no longer BF. Mom appreciative.  Patient Name: Kaitlyn Whitaker [id number]'QToday's Date: 10/18/2016 Reason for consult: Follow-up assessment   Maternal Data    Feeding    LATCH Score/Interventions                      Lactation Tools Discussed/Used     Consult Status Consult Status: Complete Date: 10/18/16    Charyl DancerCARVER, Meili Kleckley G 10/18/2016, 2:07 AM

## 2016-11-01 ENCOUNTER — Telehealth: Payer: Self-pay

## 2016-11-01 NOTE — Telephone Encounter (Signed)
FMLA paperwork is fine

## 2016-11-27 ENCOUNTER — Ambulatory Visit: Payer: Medicaid Other | Admitting: Advanced Practice Midwife

## 2016-12-11 ENCOUNTER — Ambulatory Visit (INDEPENDENT_AMBULATORY_CARE_PROVIDER_SITE_OTHER): Payer: Medicaid Other | Admitting: Obstetrics and Gynecology

## 2016-12-11 ENCOUNTER — Encounter: Payer: Self-pay | Admitting: Obstetrics and Gynecology

## 2016-12-11 ENCOUNTER — Ambulatory Visit (HOSPITAL_COMMUNITY)
Admission: RE | Admit: 2016-12-11 | Discharge: 2016-12-11 | Disposition: A | Discharge status: Home / Self Care | Payer: Medicaid Other | Source: Ambulatory Visit | Attending: Anesthesiology | Admitting: Anesthesiology

## 2016-12-11 ENCOUNTER — Other Ambulatory Visit: Payer: Self-pay | Admitting: Internal Medicine

## 2016-12-11 DIAGNOSIS — Z029 Encounter for administrative examinations, unspecified: Secondary | ICD-10-CM | POA: Diagnosis not present

## 2016-12-11 DIAGNOSIS — O9229 Other disorders of breast associated with pregnancy and the puerperium: Secondary | ICD-10-CM | POA: Insufficient documentation

## 2016-12-11 LAB — POCT PREGNANCY, URINE: Preg Test, Ur: NEGATIVE

## 2016-12-11 NOTE — Progress Notes (Signed)
Subjective:  States was diagnosed with a breast infection and was prescribed cephalexin by pcp- took it for 8 days with no change in the pain/tenderness in her breast. Is not breastfeeding  anylonger - stopped before discharge. Infection was diagnosed when baby was 4 month old.  1-2 weeks after delivery she noticed tenderness in her breast. In the last month she has felt sharp shooting pain in the right breast. She took antibiotics for 8 days and did not notice any changes in her symptoms. Some redness, no fever. No flu like symptoms.    Kaitlyn Whitaker is a 21 y.o. female who presents for a postpartum visit. She is 8 weeks postpartum following a spontaneous vaginal delivery. I have fully reviewed the prenatal and intrapartum course. The delivery was at [redacted]w[redacted]d gestational weeks. Outcome: spontaneous vaginal delivery. Anesthesia: epidural. Postpartum course has been uncomplicated. Baby's course has been uncomplicated. Baby is feeding by bottle - Enfamil Nutramigen. Bleeding no bleeding. Bowel function is normal. Bladder function is normal. Patient is not sexually active. Contraception method is none. Postpartum depression screening: negative.  The following portions of the patient's history were reviewed and updated as appropriate: allergies, current medications, past family history, past medical history, past social history, past surgical history and problem list.  Review of Systems Pertinent items are noted in HPI.  Objective:    BP 113/61   Pulse 71   Ht 5' 4.5" (1.638 m)   Wt 110 lb 9.6 oz (50.2 kg)   Breastfeeding? No   BMI 18.69 kg/m     General:  alert and cooperative   Breasts:  negative findings: palpation negative for masses or nodules and central nipple erythema. No surrounding skin tenderness or erythema. Right nipple erythema and tenderness.   Lungs: clear to auscultation bilaterally  Heart:  regular rate and rhythm, S1, S2 normal, no murmur, click, rub or gallop  Abdomen:  soft, non-tender; bowel sounds normal; no masses,  no organomegaly      Assessment:   Normal postpartum exam. Pap smear not done at today's visit. Last pap 2016 and it was normal.  Plan:   1. Contraception: none 2. Ambulatory referral to lactation. Will refer for breast US. If normal, may consider antifungal topical. Ibuprofen as needed, as directed on the bottle.  3. Follow up or as needed.   Duane Lope, NP

## 2016-12-11 NOTE — Lactation Note (Signed)
Lactation Consult  Mother's reason for visit:  Right breast pain bottom of areola Visit Type:  Outpatient/Walk in  Appointment Notes:  Mother is postpartum 2 months.  She breastfed for 2 days and then stopped and switched to formula feeding. At the end of the first week the pain began and then intensified at one month postpartum. Patient complains of pain on lower part of areola approx at 6'oclock includes shooting through her back, at times interrupting her sleep.  Pain score of 4-5. Patient states she was treated for mastitis with Cephalexin 500 mg q 6hr for 8 days with no relief at one month. Both nipples are both slightly pink.  No fever, hard areas or redness. No discharge or continued lactating.  No acute findings. Suggest she return to clinic for further evaluation. Discussed follow up with Venia Carbon NP regarding further tests and possible treatment with antifungal and NSAID. Mother has taken tylenol for pain with no relief.   Consult:  Initial Lactation Consultant:  Hardie Pulley  ________________________________________________________________________    ________________________________________________________________________  Mother's Name: Kaitlyn Whitaker Type of delivery:  Vaginal Breastfeeding Experience:  primip Maternal Medications:  Tylenol  ________________________________________________________________________

## 2016-12-11 NOTE — Consult Note (Signed)
Telephone call - This LC received a phone call from the NP - Victorino Dike from the [facility] - in regards to concerns with this patient - 2 months old , stopped breast feeding early on and switched to formula feeding. Has had right nipple pain and when she raises her arm the pain radiates to her armpit area. Was tx for mastitis. And the pain and tenderness in the nipple is still present. NP Victorino Dike asked this LC what her recommendation would be.  LC recommended exploring with mom if her milk has dried up, or if she was still leaking.  R/O yeast infection. And if those items were negative consider dx mammogram.  2nd call - this LC arranged for mom to be seen in [facility] in the afternoon for full assessment by an LC .

## 2016-12-13 ENCOUNTER — Ambulatory Visit
Admission: RE | Admit: 2016-12-13 | Discharge: 2016-12-13 | Disposition: A | Discharge status: Home / Self Care | Payer: Medicaid Other | Source: Ambulatory Visit | Attending: Obstetrics and Gynecology | Admitting: Obstetrics and Gynecology

## 2016-12-13 DIAGNOSIS — O9229 Other disorders of breast associated with pregnancy and the puerperium: Secondary | ICD-10-CM

## 2016-12-17 ENCOUNTER — Encounter: Payer: Self-pay | Admitting: General Practice

## 2017-12-25 ENCOUNTER — Encounter: Payer: Self-pay | Admitting: *Deleted

## 2018-04-15 ENCOUNTER — Encounter: Payer: Self-pay | Admitting: Family Medicine

## 2018-04-15 ENCOUNTER — Ambulatory Visit (INDEPENDENT_AMBULATORY_CARE_PROVIDER_SITE_OTHER): Payer: Self-pay | Admitting: General Practice

## 2018-04-15 DIAGNOSIS — Z3201 Encounter for pregnancy test, result positive: Secondary | ICD-10-CM

## 2018-04-15 LAB — POCT PREGNANCY, URINE: PREG TEST UR: POSITIVE — AB

## 2018-04-15 NOTE — Progress Notes (Signed)
Patient presents to office today for UPT. UPT +. Patient reports first positive home test last Saturday. LMP 02/09/18 EDD 11/16/18 937w2d. Patient reports taking PNV, lexapro 5mg , & cymbalta 60mg - per Dr Marice Potterove meds are category C but patient should continue if she needs them/is more beneficial to her than stopping. Reviewed with patient. Patient verbalized understanding & had no questions.

## 2018-04-16 ENCOUNTER — Inpatient Hospital Stay (HOSPITAL_COMMUNITY)
Admission: AD | Admit: 2018-04-16 | Discharge: 2018-04-16 | Disposition: A | Discharge status: Home / Self Care | Payer: Medicaid Other | Source: Ambulatory Visit | Attending: Obstetrics and Gynecology | Admitting: Obstetrics and Gynecology

## 2018-04-16 ENCOUNTER — Inpatient Hospital Stay (HOSPITAL_COMMUNITY): Payer: Medicaid Other

## 2018-04-16 ENCOUNTER — Encounter (HOSPITAL_COMMUNITY): Payer: Self-pay | Admitting: *Deleted

## 2018-04-16 ENCOUNTER — Other Ambulatory Visit: Payer: Self-pay

## 2018-04-16 DIAGNOSIS — Z3A09 9 weeks gestation of pregnancy: Secondary | ICD-10-CM | POA: Diagnosis not present

## 2018-04-16 DIAGNOSIS — O209 Hemorrhage in early pregnancy, unspecified: Secondary | ICD-10-CM | POA: Diagnosis present

## 2018-04-16 DIAGNOSIS — O3680X0 Pregnancy with inconclusive fetal viability, not applicable or unspecified: Secondary | ICD-10-CM

## 2018-04-16 DIAGNOSIS — O200 Threatened abortion: Secondary | ICD-10-CM | POA: Insufficient documentation

## 2018-04-16 DIAGNOSIS — Z87891 Personal history of nicotine dependence: Secondary | ICD-10-CM | POA: Insufficient documentation

## 2018-04-16 LAB — COMPREHENSIVE METABOLIC PANEL
ALT: 14 U/L (ref 0–44)
ANION GAP: 9 (ref 5–15)
AST: 21 U/L (ref 15–41)
Albumin: 4.3 g/dL (ref 3.5–5.0)
Alkaline Phosphatase: 56 U/L (ref 38–126)
BUN: 11 mg/dL (ref 6–20)
CHLORIDE: 103 mmol/L (ref 98–111)
CO2: 25 mmol/L (ref 22–32)
Calcium: 9.2 mg/dL (ref 8.9–10.3)
Creatinine, Ser: 0.66 mg/dL (ref 0.44–1.00)
Glucose, Bld: 91 mg/dL (ref 70–99)
POTASSIUM: 3.6 mmol/L (ref 3.5–5.1)
Sodium: 137 mmol/L (ref 135–145)
Total Bilirubin: 0.6 mg/dL (ref 0.3–1.2)
Total Protein: 6.9 g/dL (ref 6.5–8.1)

## 2018-04-16 LAB — HCG, QUANTITATIVE, PREGNANCY: HCG, BETA CHAIN, QUANT, S: 5655 m[IU]/mL — AB (ref <5)

## 2018-04-16 LAB — URINALYSIS, ROUTINE W REFLEX MICROSCOPIC

## 2018-04-16 LAB — CBC WITH DIFFERENTIAL/PLATELET
Basophils Absolute: 0 10*3/uL (ref 0.0–0.1)
Basophils Relative: 0 %
EOS PCT: 1 %
Eosinophils Absolute: 0.1 10*3/uL (ref 0.0–0.7)
HCT: 34.9 % — ABNORMAL LOW (ref 36.0–46.0)
Hemoglobin: 12.6 g/dL (ref 12.0–15.0)
LYMPHS ABS: 2.2 10*3/uL (ref 0.7–4.0)
LYMPHS PCT: 29 %
MCH: 31 pg (ref 26.0–34.0)
MCHC: 36.1 g/dL — AB (ref 30.0–36.0)
MCV: 86 fL (ref 78.0–100.0)
MONO ABS: 0.5 10*3/uL (ref 0.1–1.0)
Monocytes Relative: 7 %
Neutro Abs: 4.9 10*3/uL (ref 1.7–7.7)
Neutrophils Relative %: 63 %
PLATELETS: 246 10*3/uL (ref 150–400)
RBC: 4.06 MIL/uL (ref 3.87–5.11)
RDW: 12.8 % (ref 11.5–15.5)
WBC: 7.8 10*3/uL (ref 4.0–10.5)

## 2018-04-16 LAB — URINALYSIS, MICROSCOPIC (REFLEX)

## 2018-04-16 MED ORDER — IBUPROFEN 600 MG PO TABS: 600.0000 mg | ORAL_TABLET | Freq: Four times a day (QID) | ORAL | 0 refills | Status: DC | PRN

## 2018-04-16 NOTE — MAU Provider Note (Signed)
History     CSN: [id number]  Arrival date and time: 04/16/18 1646   First Provider Initiated Contact with Patient 04/16/18 1803      Chief Complaint  Patient presents with  . Abdominal Pain  . Vaginal Bleeding   HPI   Ms.Kaitlyn Whitaker is a 22 y.o. female G3P1011 @ 70w3dhere with vaginal bleeding and abdominal cramping. This is a new problem. The symptoms started last night. She describes  the bleeding as heavy vaginal bleeding. The bleeding is menstrual like. The bleeding has been heavy most of the day. Some mild cramping in her lower abdomen that comes and goes. She has not taken anything for the pain.   OB History    Gravida  3   Para  1   Term  1   Preterm      AB  1   Living  1     SAB  1   TAB      Ectopic      Multiple  0   Live Births  1           Past Medical History:  Diagnosis Date  . Back pain    Pt states chronic back pain after fall in 2016  . GERD (gastroesophageal reflux disease)     Past Surgical History:  Procedure Laterality Date  . TONSILLECTOMY      Family History  Problem Relation Age of Onset  . Diabetes Maternal Uncle   . Hypertension Father     Social History   Tobacco Use  . Smoking status: Former Smoker    Types: Cigarettes    Last attempt to quit: 01/13/2016    Years since quitting: 2.2  . Smokeless tobacco: Never Used  Substance Use Topics  . Alcohol use: Yes    Comment: occasional wine  . Drug use: No    Allergies: No Known Allergies  Medications Prior to Admission  Medication Sig Dispense Refill Last Dose  . cephALEXin (KEFLEX) 500 MG capsule Take 500 mg by mouth 4 (four) times daily.   Not Taking  . ibuprofen (ADVIL,MOTRIN) 600 MG tablet Take 1 tablet (600 mg total) by mouth every 6 (six) hours as needed. 30 tablet 0 Taking  . Prenatal Vit-Fe Fumarate-FA (PRENATAL VITAMIN) 27-0.8 MG TABS Take 1 tablet by mouth daily.   Not Taking   Results for orders placed or performed during the hospital  encounter of 04/16/18 (from the past 48 hour(s))  Urinalysis, Routine w reflex microscopic     Status: Abnormal   Collection Time: 04/16/18  5:13 PM  Result Value Ref Range   Color, Urine BROWN (A) YELLOW    Comment: RED BIOCHEMICALS MAY BE AFFECTED BY COLOR    APPearance TURBID (A) CLEAR   Specific Gravity, Urine  1.005 - 1.030    TEST NOT REPORTED DUE TO COLOR INTERFERENCE OF URINE PIGMENT   pH  5.0 - 8.0    TEST NOT REPORTED DUE TO COLOR INTERFERENCE OF URINE PIGMENT   Glucose, UA (A) NEGATIVE mg/dL    TEST NOT REPORTED DUE TO COLOR INTERFERENCE OF URINE PIGMENT   Hgb urine dipstick (A) NEGATIVE    TEST NOT REPORTED DUE TO COLOR INTERFERENCE OF URINE PIGMENT   Bilirubin Urine (A) NEGATIVE    TEST NOT REPORTED DUE TO COLOR INTERFERENCE OF URINE PIGMENT   Ketones, ur (A) NEGATIVE mg/dL    TEST NOT REPORTED DUE TO COLOR INTERFERENCE OF URINE PIGMENT   Protein, ur (A)  NEGATIVE mg/dL    TEST NOT REPORTED DUE TO COLOR INTERFERENCE OF URINE PIGMENT   Nitrite (A) NEGATIVE    TEST NOT REPORTED DUE TO COLOR INTERFERENCE OF URINE PIGMENT   Leukocytes, UA (A) NEGATIVE    TEST NOT REPORTED DUE TO COLOR INTERFERENCE OF URINE PIGMENT    Comment: Performed at [facility], [address] [place], [place] [address]  Urinalysis, Microscopic (reflex)     Status: Abnormal   Collection Time: 04/16/18  5:13 PM  Result Value Ref Range   RBC / HPF >50 0 - 5 RBC/hpf   WBC, UA 0-5 0 - 5 WBC/hpf   Bacteria, UA FEW (A) NONE SEEN   Squamous Epithelial / LPF 0-5 0 - 5   Urine-Other MICROSCOPIC EXAM PERFORMED ON UNCONCENTRATED URINE     Comment: Performed at [facility], [address] [place], [place] [address]  CBC with Differential/Platelet     Status: Abnormal   Collection Time: 04/16/18  5:41 PM  Result Value Ref Range   WBC 7.8 4.0 - 10.5 K/uL   RBC 4.06 3.87 - 5.11 MIL/uL   Hemoglobin 12.6 12.0 - 15.0 g/dL   HCT 34.9 (L) 36.0 - 46.0 %   MCV 86.0 78.0 - 100.0 fL   MCH 31.0 26.0 -  34.0 pg   MCHC 36.1 (H) 30.0 - 36.0 g/dL   RDW 12.8 11.5 - 15.5 %   Platelets 246 150 - 400 K/uL   Neutrophils Relative % 63 %   Neutro Abs 4.9 1.7 - 7.7 K/uL   Lymphocytes Relative 29 %   Lymphs Abs 2.2 0.7 - 4.0 K/uL   Monocytes Relative 7 %   Monocytes Absolute 0.5 0.1 - 1.0 K/uL   Eosinophils Relative 1 %   Eosinophils Absolute 0.1 0.0 - 0.7 K/uL   Basophils Relative 0 %   Basophils Absolute 0.0 0.0 - 0.1 K/uL    Comment: Performed at [facility], [address], [place], [place] [address]  Comprehensive metabolic panel     Status: None   Collection Time: 04/16/18  5:41 PM  Result Value Ref Range   Sodium 137 135 - 145 mmol/L   Potassium 3.6 3.5 - 5.1 mmol/L   Chloride 103 98 - 111 mmol/L   CO2 25 22 - 32 mmol/L   Glucose, Bld 91 70 - 99 mg/dL   BUN 11 6 - 20 mg/dL   Creatinine, Ser 0.66 0.44 - 1.00 mg/dL   Calcium 9.2 8.9 - 10.3 mg/dL   Total Protein 6.9 6.5 - 8.1 g/dL   Albumin 4.3 3.5 - 5.0 g/dL   AST 21 15 - 41 U/L   ALT 14 0 - 44 U/L   Alkaline Phosphatase 56 38 - 126 U/L   Total Bilirubin 0.6 0.3 - 1.2 mg/dL   GFR calc non Af Amer >60 >60 mL/min   GFR calc Af Amer >60 >60 mL/min    Comment: (NOTE) The eGFR has been calculated using the CKD EPI equation. This calculation has not been validated in all clinical situations. eGFR's persistently <60 mL/min signify possible Chronic Kidney Disease.    Anion gap 9 5 - 15    Comment: Performed at [facility], [address], [place], [place] [address]  hCG, quantitative, pregnancy     Status: Abnormal   Collection Time: 04/16/18  5:41 PM  Result Value Ref Range   hCG, Beta Chain, Quant, S 5,655 (H) <5 mIU/mL    Comment:  GEST. AGE      CONC.  (mIU/mL)   <=1 WEEK        5 - 50     2 WEEKS       50 - 500     3 WEEKS       100 - 10,000     4 WEEKS     1,000 - 30,000     5 WEEKS     3,500 - 115,000   6-8 WEEKS     12,000 - 270,000    12 WEEKS     15,000 - 220,000        FEMALE AND NON-PREGNANT  FEMALE:     LESS THAN 5 mIU/mL Performed at [facility], [address], [place], [place] [address]    US Ob Less Than 14 Weeks With Ob Transvaginal  Result Date: 04/16/2018 CLINICAL DATA:  Vaginal bleeding EXAM: OBSTETRIC <14 WK [place] AND TRANSVAGINAL OB US TECHNIQUE: Both transabdominal and transvaginal ultrasound examinations were performed for complete evaluation of the gestation as well as the maternal uterus, adnexal regions, and pelvic cul-de-sac. Transvaginal technique was performed to assess early pregnancy. COMPARISON:  None. FINDINGS: Intrauterine gestational sac: None visualized Yolk sac:  Not visualized Embryo:  Not visualized Cardiac Activity: Not visualized Heart Rate:   bpm MSD:   mm    w     d CRL:    mm    w    d                  [place] EDC: Subchorionic hemorrhage:  None visualized. Maternal uterus/adnexae: No adnexal mass. Trace free fluid in the pelvis. IMPRESSION: No intrauterine pregnancy visualized. Differential considerations would include early intrauterine pregnancy too early to visualize, spontaneous abortion, or occult ectopic pregnancy. Recommend close clinical followup and serial quantitative beta HCGs and ultrasounds. Electronically Signed   By: Rolm Baptise M.D.   On: 04/16/2018 19:12   Review of Systems  Gastrointestinal: Positive for abdominal pain.  Genitourinary: Positive for vaginal bleeding.  Neurological: Negative for dizziness.   Physical Exam   Blood pressure 119/84, pulse 84, temperature 98.2 F (36.8 C), temperature source Oral, height _0  (1.651 m), weight 43.1 kg, last menstrual period 02/09/2018, SpO2 99 %, not currently breastfeeding.  Physical Exam  Constitutional: She is oriented to person, place, and time. She appears well-developed and well-nourished. No distress.  HENT:  Head: Normocephalic.  Eyes: Pupils are equal, round, and reactive to light.  GI: Soft. She exhibits no distension. There is no tenderness. There is no rebound.   Genitourinary:  Genitourinary Comments: Bimanual exam: Moderate amount of blood noted on perineum.  Cervix closed Uterus non tender, normal size Adnexa non tender, no masses bilaterally GC/Chlam, wet prep done Chaperone present for exam.   Musculoskeletal: Normal range of motion.  Neurological: She is alert and oriented to person, place, and time.  Skin: Skin is warm. She is not diaphoretic.  Psychiatric: Her behavior is normal.   MAU Course  Procedures  None  MDM  B positive blood type  HIV, CBC, Hcg, ABO US OB transvaginal  Discussed HPI, labs and [place] with Dr. Charlesetta Garibaldi. [place] for DC home.  Assessment and Plan   A:  1. Threatened miscarriage   2. [redacted] weeks gestation of pregnancy   3. Vaginal bleeding in pregnancy, first trimester   4. Pregnancy of unknown anatomic location     P:  Discharge home in stable condition Patient unable to follow  up in the office next week due to work schedule. Return to MAU on Saturday Eve for repeat quant Bleeding precautions Pelvic rest Return to MAU if symptoms worsen   Brentlee Delage, Artist Pais, NP 04/17/2018 5:25 PM

## 2018-04-16 NOTE — MAU Note (Addendum)
Pt presents with c/o abdominal cramping & VB.  Reports had spotted off and on yesterday, but has since started bleeding heavier today.  Last intercourse 04/15/2018. +UPT 04/15/18

## 2018-04-16 NOTE — Discharge Instructions (Signed)

## 2018-04-27 ENCOUNTER — Ambulatory Visit (HOSPITAL_COMMUNITY): Payer: Self-pay | Admitting: Psychiatry

## 2018-06-09 ENCOUNTER — Other Ambulatory Visit: Payer: Self-pay | Admitting: Family Medicine

## 2018-06-09 ENCOUNTER — Ambulatory Visit
Admission: RE | Admit: 2018-06-09 | Discharge: 2018-06-09 | Disposition: A | Discharge status: Home / Self Care | Payer: BLUE CROSS/BLUE SHIELD | Source: Ambulatory Visit | Attending: Family Medicine | Admitting: Family Medicine

## 2018-06-09 DIAGNOSIS — R7611 Nonspecific reaction to tuberculin skin test without active tuberculosis: Secondary | ICD-10-CM

## 2018-07-06 ENCOUNTER — Encounter (HOSPITAL_COMMUNITY): Payer: Self-pay | Admitting: *Deleted

## 2018-07-06 ENCOUNTER — Inpatient Hospital Stay (HOSPITAL_COMMUNITY)
Admission: AD | Admit: 2018-07-06 | Discharge: 2018-07-06 | Disposition: A | Discharge status: Home / Self Care | Payer: BLUE CROSS/BLUE SHIELD | Source: Ambulatory Visit | Attending: Obstetrics and Gynecology | Admitting: Obstetrics and Gynecology

## 2018-07-06 ENCOUNTER — Other Ambulatory Visit: Payer: Self-pay

## 2018-07-06 ENCOUNTER — Ambulatory Visit (HOSPITAL_COMMUNITY)
Admission: EM | Admit: 2018-07-06 | Discharge: 2018-07-06 | Disposition: A | Discharge status: Home / Self Care | Payer: BLUE CROSS/BLUE SHIELD | Source: Home / Self Care

## 2018-07-06 DIAGNOSIS — O99611 Diseases of the digestive system complicating pregnancy, first trimester: Secondary | ICD-10-CM | POA: Diagnosis not present

## 2018-07-06 DIAGNOSIS — Z3201 Encounter for pregnancy test, result positive: Secondary | ICD-10-CM | POA: Diagnosis not present

## 2018-07-06 DIAGNOSIS — O26891 Other specified pregnancy related conditions, first trimester: Secondary | ICD-10-CM

## 2018-07-06 DIAGNOSIS — E860 Dehydration: Secondary | ICD-10-CM | POA: Diagnosis not present

## 2018-07-06 DIAGNOSIS — Z87891 Personal history of nicotine dependence: Secondary | ICD-10-CM | POA: Insufficient documentation

## 2018-07-06 DIAGNOSIS — K219 Gastro-esophageal reflux disease without esophagitis: Secondary | ICD-10-CM | POA: Diagnosis not present

## 2018-07-06 DIAGNOSIS — O219 Vomiting of pregnancy, unspecified: Secondary | ICD-10-CM | POA: Diagnosis not present

## 2018-07-06 DIAGNOSIS — Z3A01 Less than 8 weeks gestation of pregnancy: Secondary | ICD-10-CM | POA: Insufficient documentation

## 2018-07-06 DIAGNOSIS — O210 Mild hyperemesis gravidarum: Secondary | ICD-10-CM | POA: Diagnosis present

## 2018-07-06 DIAGNOSIS — R1110 Vomiting, unspecified: Secondary | ICD-10-CM | POA: Diagnosis not present

## 2018-07-06 DIAGNOSIS — R1084 Generalized abdominal pain: Secondary | ICD-10-CM

## 2018-07-06 HISTORY — DX: Major depressive disorder, single episode, unspecified: F32.9

## 2018-07-06 HISTORY — DX: Depression, unspecified: F32.A

## 2018-07-06 LAB — URINALYSIS, ROUTINE W REFLEX MICROSCOPIC
GLUCOSE, UA: NEGATIVE mg/dL
HGB URINE DIPSTICK: NEGATIVE
KETONES UR: 20 mg/dL — AB
NITRITE: NEGATIVE
PROTEIN: 100 mg/dL — AB
Specific Gravity, Urine: 1.032 — ABNORMAL HIGH (ref 1.005–1.030)
Squamous Epithelial / LPF: >50 — ABNORMAL HIGH (ref 0–5)
pH: 6 (ref 5.0–8.0)

## 2018-07-06 LAB — POCT PREGNANCY, URINE: Preg Test, Ur: POSITIVE — AB

## 2018-07-06 MED ORDER — METOCLOPRAMIDE HCL 10 MG PO TABS
10.0000 mg | ORAL_TABLET | Freq: Once | ORAL | Status: AC
  Administered 2018-07-06: 10 mg via ORAL
  Filled 2018-07-06: qty 1

## 2018-07-06 MED ORDER — METOCLOPRAMIDE HCL 10 MG PO TABS
10.0000 mg | ORAL_TABLET | Freq: Three times a day (TID) | ORAL | 1 refills | Status: DC
Start: 2018-07-06 — End: 2018-08-31

## 2018-07-06 MED ORDER — RANITIDINE HCL 150 MG PO TABS: 150.0000 mg | ORAL_TABLET | Freq: Two times a day (BID) | ORAL | 0 refills | Status: DC

## 2018-07-06 NOTE — H&P (Signed)
Chief Complaint: Emesis and Abdominal Pain   First Provider Initiated Contact with Patient 07/06/18 1030      SUBJECTIVE Kaitlyn Whitaker is a 22 y.o. [id number] who presents to maternity admissions for nausea and vomiting with epigastric abdominal pain. She denies vaginal bleeding. 2 day history of intractable vomiting without ability to keep down solids or liquids. Ice chips did not help. She is concerned that she may have the flu today. She denies fever but indicates subjective chills. No cough. She otherwise feels healthy and is mostly concerned for fetus wellbeing. She denies lower abdominal pain, diarrhea, SOB, calf pain, hematemesis, hematochezia.   UA at [facility] showed moderate leukocytes with rare bacteria.    Past Medical History:  Diagnosis Date  . Back pain    Pt states chronic back pain after fall in 2016  . Depression    feeling balanced, no problems  . GERD (gastroesophageal reflux disease)    Past Surgical History:  Procedure Laterality Date  . TONSILLECTOMY     Social History   Socioeconomic History  . Marital status: Single    Spouse name: Not on file  . Number of children: 1  . Years of education: Not on file  . Highest education level: Not on file  Occupational History  . Not on file  Social Needs  . Financial resource strain: Not on file  . Food insecurity:    Worry: Not on file    Inability: Not on file  . Transportation needs:    Medical: Not on file    Non-medical: Not on file  Tobacco Use  . Smoking status: Former Smoker    Types: Cigarettes    Last attempt to quit: 01/13/2016    Years since quitting: 2.4  . Smokeless tobacco: Never Used  Substance and Sexual Activity  . Alcohol use: Not Currently    Frequency: Never  . Drug use: No  . Sexual activity: Yes    Birth control/protection: None  Lifestyle  . Physical activity:    Days per week: Not on file    Minutes per session: Not on file  . Stress: Not on file  Relationships  . Social  connections:    Talks on phone: Not on file    Gets together: Not on file    Attends religious service: Not on file    Active member of club or organization: Not on file    Attends meetings of clubs or organizations: Not on file    Relationship status: Not on file  . Intimate partner violence:    Fear of current or ex partner: Not on file    Emotionally abused: Not on file    Physically abused: Not on file    Forced sexual activity: Not on file  Other Topics Concern  . Not on file  Social History Narrative  . Not on file   No current facility-administered medications on file prior to encounter.    Current Outpatient Medications on File Prior to Encounter  Medication Sig Dispense Refill  . cephALEXin (KEFLEX) 500 MG capsule Take 500 mg by mouth 4 (four) times daily.    Marland Kitchen ibuprofen (ADVIL,MOTRIN) 600 MG tablet Take 1 tablet (600 mg total) by mouth every 6 (six) hours as needed. 30 tablet 0  . Prenatal Vit-Fe Fumarate-FA (PRENATAL VITAMIN) 27-0.8 MG TABS Take 1 tablet by mouth daily.     No Known Allergies  ROS:  A 10-system review of systems were negative except as per HPI.  I have reviewed patient's Past Medical Hx, Surgical Hx, Family Hx, Social Hx, medications and allergies.   Physical Exam   Patient Vitals for the past 24 hrs:  BP Temp Temp src Pulse Resp SpO2 Weight  07/06/18 1149 (!) 105/57 - - 68 16 - -  07/06/18 1000 108/64 (!) 97.5 F (36.4 C) Axillary 72 18 100 % 42.3 kg   Physical Exam  Constitutional: She is oriented to person, place, and time.  Thin, pleasant female in NAD  HENT:  Head: Normocephalic and atraumatic.  Eyes: EOM are normal.  Neck: Normal range of motion.  Cardiovascular: Normal rate and regular rhythm.  Respiratory: Effort normal and breath sounds normal.  GI: Soft. Bowel sounds are normal.  Epigastric tenderness  Musculoskeletal: Normal range of motion.  Neurological: She is alert and oriented to person, place, and time.  Skin: Skin is  warm and dry.  Psychiatric: She has a normal mood and affect.    ASSESSMENT Kaitlyn Whitaker is a 22 y.o. [id number] who presents to maternity admissions for nausea and vomiting in pregnancy. Afebrile without concerning symptoms for infection.  PLAN Discharge home in stable condition Metocloperamide (reglan) 10mg  TID ac PRN Oral rehydration with Pedialyte Patient may return to MAU as needed or if her condition were to change or worsen   Adella Hare, Medical Student 07/06/2018 11:53 AM

## 2018-07-06 NOTE — Discharge Instructions (Signed)

## 2018-07-06 NOTE — Discharge Instructions (Signed)
Go ahead and go to the women's ER.  Your test was positive here and you are having abdominal pain and vomiting for 2 days. You need some IV rehydration and nausea medication and ultrasound.

## 2018-07-06 NOTE — ED Provider Notes (Signed)
[facility]    CSN: [id number] Arrival date & time: 07/06/18  0802     History   Chief Complaint Chief Complaint  Patient presents with  . Emesis    HPI Kaitlyn Whitaker is a 22 y.o. female.   Pt is a 22 year old female that presents with 2 days of abd discomfort and vomiting. Symptoms have been constant and remained the same. She has been unable to hold down any fluids or food. She has been vomiting bile.  She is pregnant, confirmed at home. Her LMP was mid September. She has not been seen by OB/GYN or had any confirmation by ultrasound of intrauterine pregnany. No recent sick contacts. She denies any fevers but has had chills. No diarrhea. No vaginal bleeding, discharge, dysuria, hematuria.   ROS per HPI      Past Medical History:  Diagnosis Date  . Back pain    Pt states chronic back pain after fall in 2016  . GERD (gastroesophageal reflux disease)     Patient Active Problem List   Diagnosis Date Noted  . Postpartum care and examination 12/11/2016  . Postpartum nipple pain 12/11/2016  . Pregnancy 10/15/2016  . Supervision of normal pregnancy, antepartum 05/23/2016    Past Surgical History:  Procedure Laterality Date  . TONSILLECTOMY      OB History    Gravida  4   Para  1   Term  1   Preterm      AB  2   Living  1     SAB  1   TAB      Ectopic      Multiple  0   Live Births  1            Home Medications    Prior to Admission medications   Medication Sig Start Date End Date Taking? Authorizing Provider  cephALEXin (KEFLEX) 500 MG capsule Take 500 mg by mouth 4 (four) times daily.    [provider]  ibuprofen (ADVIL,MOTRIN) 600 MG tablet Take 1 tablet (600 mg total) by mouth every 6 (six) hours as needed. 04/16/18   Rasch, Victorino Dike I, NP  Prenatal Vit-Fe Fumarate-FA (PRENATAL VITAMIN) 27-0.8 MG TABS Take 1 tablet by mouth daily.    [provider]    Family History Family History  Problem Relation  Age of Onset  . Diabetes Maternal Uncle   . Hypertension Father     Social History Social History   Tobacco Use  . Smoking status: Former Smoker    Types: Cigarettes    Last attempt to quit: 01/13/2016    Years since quitting: 2.4  . Smokeless tobacco: Never Used  Substance Use Topics  . Alcohol use: Not Currently    Frequency: Never    Comment: occasional wine  . Drug use: No     Allergies   Patient has no known allergies.   Review of Systems Review of Systems   Physical Exam Triage Vital Signs ED Triage Vitals  Enc Vitals Group     BP 07/06/18 0833 (!) 96/58     Pulse Rate 07/06/18 0833 69     Resp 07/06/18 0833 20     Temp 07/06/18 0833 (!) 95 F (35 C)     Temp Source 07/06/18 0833 Oral     SpO2 07/06/18 0833 100 %     Weight --      Height --      Head Circumference --  Peak Flow --      Pain Score 07/06/18 0837 9     Pain Loc --      Pain Edu? --      Excl. in GC? --    No data found.  Updated Vital Signs BP (!) 96/58 (BP Location: Right Arm)   Pulse 69   Temp (!) 95 F (35 C) (Oral)   Resp 20   LMP 02/09/2018 (Exact Date)   SpO2 100%   Visual Acuity Right Eye Distance:   Left Eye Distance:   Bilateral Distance:    Right Eye Near:   Left Eye Near:    Bilateral Near:     Physical Exam  Constitutional: She appears well-developed and well-nourished.  HENT:  Head: Normocephalic and atraumatic.  Mucous membranes dry.   Eyes: Conjunctivae are normal.  Neck: Normal range of motion.  Pulmonary/Chest: Effort normal.  Abdominal: Soft.  Generalized abdominal discomfort  Musculoskeletal: Normal range of motion.  Neurological: She is alert.  Skin: Skin is warm. There is pallor.  Psychiatric: She has a normal mood and affect.  Nursing note and vitals reviewed.    UC Treatments / Results  Labs (all labs ordered are listed, but only abnormal results are displayed) Labs Reviewed  POCT PREGNANCY, URINE - Abnormal; Notable for the  following components:      Result Value   Preg Test, Ur POSITIVE (*)    All other components within normal limits    EKG None  Radiology No results found.  Procedures Procedures (including critical care time)  Medications Ordered in UC Medications - No data to display  Initial Impression / Assessment and Plan / UC Course  I have reviewed the triage vital signs and the nursing notes.  Pertinent labs & imaging results that were available during my care of the patient were reviewed by me and considered in my medical decision making (see chart for details).      Sending pt to the women's ER for ultrasound and rehydration. She is very dehydrated and having abdominal discomfort.  Urine pregnancy confirmed here.  Patient understanding and agreeable to plan  Final Clinical Impressions(s) / UC Diagnoses   Final diagnoses:  Dehydration  Positive pregnancy test  Generalized abdominal pain     Discharge Instructions     Go ahead and go to the women's ER.  Your test was positive here and you are having abdominal pain and vomiting for 2 days. You need some IV rehydration and nausea medication and ultrasound.      ED Prescriptions    None     Controlled Substance Prescriptions [place] Controlled Substance Registry consulted? Not Applicable   Janace Aris, NP 07/06/18 [phone]

## 2018-07-06 NOTE — MAU Provider Note (Signed)
History     CSN: [id number]  Arrival date and time: 07/06/18 0909   First Provider Initiated Contact with Patient 07/06/18 1030      Chief Complaint  Patient presents with  . Emesis  . Abdominal Pain   HPI   Ms.Kaitlyn Whitaker is a 22 y.o. female [phone] @ [redacted]w[redacted]d here in MAU with N/V and epigastric pain. Says she has had 2 days of vomiting; states she is not able to keep anything down. She has tried small meals and ice chips and has not been able to keep them down. She denies lower abdominal pain or bleeding. Pain is upper abdominal pain and at times feels like burning. The pain worsens after she vomits.   OB History    Gravida  4   Para  1   Term  1   Preterm      AB  2   Living  1     SAB  2   TAB      Ectopic      Multiple  0   Live Births  1           Past Medical History:  Diagnosis Date  . Back pain    Pt states chronic back pain after fall in 2016  . Depression    feeling balanced, no problems  . GERD (gastroesophageal reflux disease)     Past Surgical History:  Procedure Laterality Date  . TONSILLECTOMY      Family History  Problem Relation Age of Onset  . Diabetes Maternal Uncle   . Hypertension Father     Social History   Tobacco Use  . Smoking status: Former Smoker    Types: Cigarettes    Last attempt to quit: 01/13/2016    Years since quitting: 2.4  . Smokeless tobacco: Never Used  Substance Use Topics  . Alcohol use: Not Currently    Frequency: Never  . Drug use: No    Allergies: No Known Allergies  Medications Prior to Admission  Medication Sig Dispense Refill Last Dose  . cephALEXin (KEFLEX) 500 MG capsule Take 500 mg by mouth 4 (four) times daily.   Not Taking  . ibuprofen (ADVIL,MOTRIN) 600 MG tablet Take 1 tablet (600 mg total) by mouth every 6 (six) hours as needed. 30 tablet 0   . Prenatal Vit-Fe Fumarate-FA (PRENATAL VITAMIN) 27-0.8 MG TABS Take 1 tablet by mouth daily.   Not Taking   Results for orders  placed or performed during the hospital encounter of 07/06/18 (from the past 48 hour(s))  Urinalysis, Routine w reflex microscopic     Status: Abnormal   Collection Time: 07/06/18 10:17 AM  Result Value Ref Range   Color, Urine AMBER (A) YELLOW    Comment: BIOCHEMICALS MAY BE AFFECTED BY COLOR   APPearance CLOUDY (A) CLEAR   Specific Gravity, Urine 1.032 (H) 1.005 - 1.030   pH 6.0 5.0 - 8.0   Glucose, UA NEGATIVE NEGATIVE mg/dL   Hgb urine dipstick NEGATIVE NEGATIVE   Bilirubin Urine SMALL (A) NEGATIVE   Ketones, ur 20 (A) NEGATIVE mg/dL   Protein, ur 147 (A) NEGATIVE mg/dL   Nitrite NEGATIVE NEGATIVE   Leukocytes, UA MODERATE (A) NEGATIVE   RBC / HPF 0-5 0 - 5 RBC/hpf   WBC, UA 11-20 0 - 5 WBC/hpf   Bacteria, UA RARE (A) NONE SEEN   Squamous Epithelial / LPF >50 (H) 0 - 5   Mucus PRESENT     Comment:  Performed at [facility], [address], [place], [place] [address]   Review of Systems  Constitutional: Positive for chills. Negative for fever.  Gastrointestinal: Positive for nausea and vomiting. Negative for diarrhea.  Genitourinary: Negative for dysuria.   Physical Exam   Blood pressure (!) 105/57, pulse 68, temperature (!) 97.5 F (36.4 C), temperature source Axillary, resp. rate 16, weight 42.3 kg, last menstrual period 05/17/2018, SpO2 100 %, unknown if currently breastfeeding.  Physical Exam  Constitutional: She is oriented to person, place, and time. She appears well-developed and well-nourished. No distress.  HENT:  Head: Normocephalic.  Eyes: Pupils are equal, round, and reactive to light.  GI: Normal appearance. There is tenderness in the epigastric area. There is no rigidity and no guarding.  Musculoskeletal: Normal range of motion.  Neurological: She is alert and oriented to person, place, and time.  Skin: Skin is warm. She is not diaphoretic.  Psychiatric: Her behavior is normal.   MAU Course  Procedures  None  MDM  Urine shows mild  dehydration Reglan given 10 mg PO Patient feeling better and tolerating oral fluids.   Assessment and Plan   A:  1. Nausea and vomiting during pregnancy   2. Gastroesophageal reflux disease, esophagitis presence not specified     P:  Discharge home in stable condition Rx: Reglan, Zantac Return to MAU if symptoms worsen  Establish prenatal care Small, frequent meals  Caela Huot, Harolyn Rutherford, NP 07/06/2018 8:52 PM

## 2018-07-06 NOTE — ED Triage Notes (Signed)
States she is preg. Unsure of her EDC c/o vomiting x the alst 48 hours G4 P1 AB-2

## 2018-07-06 NOTE — MAU Note (Addendum)
Past 48hrs, unable to keep anything down.  Throwing up stomach acid.  Having abd pain and chills also. Thinks she is deyhdrated.  Went to [facility], +preg test, they felt it was best to send her here. +HPT a few wks ago.

## 2018-08-14 ENCOUNTER — Encounter: Payer: Self-pay | Admitting: Student

## 2018-08-18 ENCOUNTER — Ambulatory Visit: Payer: BLUE CROSS/BLUE SHIELD | Admitting: Clinical

## 2018-08-18 ENCOUNTER — Ambulatory Visit (INDEPENDENT_AMBULATORY_CARE_PROVIDER_SITE_OTHER): Payer: BLUE CROSS/BLUE SHIELD | Admitting: *Deleted

## 2018-08-18 ENCOUNTER — Encounter: Payer: Self-pay | Admitting: Family Medicine

## 2018-08-18 VITALS — BP 117/68 | HR 85 | Wt 96.7 lb

## 2018-08-18 DIAGNOSIS — F319 Bipolar disorder, unspecified: Secondary | ICD-10-CM

## 2018-08-18 DIAGNOSIS — F4310 Post-traumatic stress disorder, unspecified: Secondary | ICD-10-CM

## 2018-08-18 DIAGNOSIS — Z3490 Encounter for supervision of normal pregnancy, unspecified, unspecified trimester: Secondary | ICD-10-CM | POA: Insufficient documentation
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
## 2018-08-21 LAB — CULTURE, OB URINE

## 2018-08-21 LAB — URINE CULTURE, OB REFLEX

## 2018-08-24 ENCOUNTER — Encounter: Payer: Self-pay | Admitting: Advanced Practice Midwife

## 2018-08-27 LAB — OBSTETRIC PANEL, INCLUDING HIV
Antibody Screen: NEGATIVE
Basophils Absolute: 0 10*3/uL (ref 0.0–0.2)
Basos: 0 %
EOS (ABSOLUTE): 0.1 10*3/uL (ref 0.0–0.4)
Eos: 1 %
HIV Screen 4th Generation wRfx: NONREACTIVE
Hematocrit: 38.8 % (ref 34.0–46.6)
Hemoglobin: 13.3 g/dL (ref 11.1–15.9)
Hepatitis B Surface Ag: NEGATIVE
Immature Grans (Abs): 0 10*3/uL (ref 0.0–0.1)
Immature Granulocytes: 0 %
Lymphocytes Absolute: 1.7 10*3/uL (ref 0.7–3.1)
Lymphs: 18 %
MCH: 31 pg (ref 26.6–33.0)
MCHC: 34.3 g/dL (ref 31.5–35.7)
MCV: 90 fL (ref 79–97)
Monocytes Absolute: 0.9 10*3/uL (ref 0.1–0.9)
Monocytes: 9 %
Neutrophils Absolute: 6.7 10*3/uL (ref 1.4–7.0)
Neutrophils: 72 %
Platelets: 347 10*3/uL (ref 150–450)
RBC: 4.29 x10E6/uL (ref 3.77–5.28)
RDW: 12.7 % (ref 12.3–15.4)
RPR Ser Ql: NONREACTIVE
Rh Factor: POSITIVE
Rubella Antibodies, IGG: 3.44 {index}
WBC: 9.4 10*3/uL (ref 3.4–10.8)

## 2018-08-27 LAB — SMN1 COPY NUMBER ANALYSIS (SMA CARRIER SCREENING)

## 2018-08-31 ENCOUNTER — Encounter: Payer: Self-pay | Admitting: Advanced Practice Midwife

## 2018-08-31 ENCOUNTER — Encounter: Payer: Self-pay | Admitting: *Deleted

## 2018-08-31 ENCOUNTER — Ambulatory Visit (INDEPENDENT_AMBULATORY_CARE_PROVIDER_SITE_OTHER): Payer: BLUE CROSS/BLUE SHIELD | Admitting: Advanced Practice Midwife

## 2018-08-31 VITALS — BP 104/72 | HR 112 | Wt 98.0 lb

## 2018-08-31 DIAGNOSIS — Z3482 Encounter for supervision of other normal pregnancy, second trimester: Secondary | ICD-10-CM

## 2018-08-31 DIAGNOSIS — Z3492 Encounter for supervision of normal pregnancy, unspecified, second trimester: Secondary | ICD-10-CM

## 2018-08-31 NOTE — Progress Notes (Signed)
Subjective:   Kaitlyn Whitaker is a 22 y.o. [id number] at 6833w1d by LMP being seen today for her first obstetrical visit.  Her obstetrical history is significant for none . Patient does intend to breast feed. Pregnancy history fully reviewed.  Patient reports no complaints.  HISTORY: OB History  Gravida Para Term Preterm AB Living  4 1 1  0 2 1  SAB TAB Ectopic Multiple Live Births  2 0 0 0 1    # Outcome Date GA Lbr Len/2nd Weight Sex Delivery Anes PTL Lv  4 Current           3 Term 10/15/16 4883w6d 17:23 / 00:56 7 lb 6.8 oz (3.368 kg) M Vag-Spont EPI  LIV     Birth Comments: wnl     Name: Kaitlyn Whitaker     Apgar1: 8  Apgar5: 9  2 SAB           1 SAB             Last pap smear was done 2017 and was normal  Past Medical History:  Diagnosis Date  . Back pain    Pt states chronic back pain after fall in 2016  . Depression    feeling balanced, no problems  . GERD (gastroesophageal reflux disease)    Past Surgical History:  Procedure Laterality Date  . TONSILLECTOMY     Family History  Problem Relation Age of Onset  . Diabetes Maternal Uncle   . Atrial fibrillation Mother   . Pulmonary embolism Mother   . Anemia Mother   . Hypertension Father   . Hypothyroidism Father    Social History   Tobacco Use  . Smoking status: Former Smoker    Types: Cigarettes    Last attempt to quit: 01/13/2016    Years since quitting: 2.6  . Smokeless tobacco: Never Used  Substance Use Topics  . Alcohol use: Not Currently    Frequency: Never  . Drug use: Not Currently    Types: Marijuana    Comment: 4 years ago   No Known Allergies Current Outpatient Medications on File Prior to Visit  Medication Sig Dispense Refill  . DULoxetine (CYMBALTA) 60 MG capsule Take by mouth daily.  1  . escitalopram (LEXAPRO) 5 MG tablet Take 5 mg by mouth daily.    . Prenatal Vit-Fe Fumarate-FA (PRENATAL VITAMIN) 27-0.8 MG TABS Take 1 tablet by mouth daily.    . metoCLOPramide (REGLAN) 10 MG  tablet Take 1 tablet (10 mg total) by mouth 4 (four) times daily -  before meals and at bedtime. (Patient not taking: Reported on 08/31/2018) 120 tablet 1  . ranitidine (ZANTAC) 150 MG tablet Take 1 tablet (150 mg total) by mouth 2 (two) times daily. (Patient not taking: Reported on 08/18/2018) 60 tablet 0   No current facility-administered medications on file prior to visit.     Review of Systems Pertinent items noted in HPI and remainder of comprehensive ROS otherwise negative.  Exam   Vitals:   08/31/18 1503  Weight: 98 lb (44.5 kg)      Physical Exam  Constitutional: She is oriented to person, place, and time and well-developed, well-nourished, and in no distress. No distress.  HENT:  Head: Normocephalic.  Cardiovascular: Normal rate.  Pulmonary/Chest: Effort normal.  Abdominal: Soft. There is no abdominal tenderness.  Neurological: She is alert and oriented to person, place, and time.  Skin: Skin is warm and dry.  Psychiatric: Affect normal.  Nursing note and vitals  reviewed.   Assessment:   Pregnancy: [id number] Patient Active Problem List   Diagnosis Date Noted  . Supervision of low-risk pregnancy 08/18/2018  . Nausea and vomiting in pregnancy prior to [redacted] weeks gestation 07/06/2018  . Postpartum care and examination 12/11/2016  . Postpartum nipple pain 12/11/2016  . Pregnancy 10/15/2016     Plan:  1. Encounter for supervision of other normal pregnancy in second trimester - Routine care - Plan for AFP at next visit    Initial labs done and reviewed. Continue prenatal vitamins. Genetic Screening discussed, AFP and NIPS: requested. Will do AFP at next visit since LMP is only an approximation.  Ultrasound discussed; fetal anatomic survey: ordered. Problem list reviewed and updated. The nature [facility] with multiple MDs and other Advanced Practice Providers was explained to patient; also emphasized that residents, students  are part of our team. Routine obstetric precautions reviewed. 50% of 45 min visit spent in counseling and coordination of care. Return in about 4 weeks (around 09/28/2018).  Thressa ShellerHeather Kyra Laffey DNP, CNM  08/31/18  3:09 PM

## 2018-09-01 ENCOUNTER — Telehealth: Payer: Self-pay | Admitting: *Deleted

## 2018-09-01 ENCOUNTER — Encounter: Payer: Self-pay | Admitting: *Deleted

## 2018-09-01 NOTE — Telephone Encounter (Signed)
Received a request for insurance information from [employer] re: [employer]. Information verified.
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
## 2018-09-21 ENCOUNTER — Encounter (HOSPITAL_COMMUNITY): Payer: Self-pay

## 2018-09-28 ENCOUNTER — Encounter: Payer: Self-pay | Admitting: Advanced Practice Midwife

## 2018-09-28 ENCOUNTER — Other Ambulatory Visit (HOSPITAL_COMMUNITY): Payer: Self-pay | Admitting: *Deleted

## 2018-09-28 ENCOUNTER — Ambulatory Visit (INDEPENDENT_AMBULATORY_CARE_PROVIDER_SITE_OTHER): Payer: Medicaid Other | Admitting: Advanced Practice Midwife

## 2018-09-28 ENCOUNTER — Ambulatory Visit (HOSPITAL_COMMUNITY)
Admission: RE | Admit: 2018-09-28 | Discharge: 2018-09-28 | Disposition: A | Discharge status: Home / Self Care | Payer: Medicaid Other | Source: Ambulatory Visit | Attending: Advanced Practice Midwife | Admitting: Advanced Practice Midwife

## 2018-09-28 VITALS — BP 118/76 | HR 101 | Wt 105.7 lb

## 2018-09-28 DIAGNOSIS — Z363 Encounter for antenatal screening for malformations: Secondary | ICD-10-CM | POA: Diagnosis not present

## 2018-09-28 DIAGNOSIS — Z3482 Encounter for supervision of other normal pregnancy, second trimester: Secondary | ICD-10-CM

## 2018-09-28 DIAGNOSIS — Z3492 Encounter for supervision of normal pregnancy, unspecified, second trimester: Secondary | ICD-10-CM

## 2018-09-28 DIAGNOSIS — Z3A19 19 weeks gestation of pregnancy: Secondary | ICD-10-CM | POA: Diagnosis not present

## 2018-09-28 DIAGNOSIS — Z362 Encounter for other antenatal screening follow-up: Secondary | ICD-10-CM

## 2018-09-28 DIAGNOSIS — Z3490 Encounter for supervision of normal pregnancy, unspecified, unspecified trimester: Secondary | ICD-10-CM | POA: Diagnosis present

## 2018-09-28 NOTE — Progress Notes (Signed)
   PRENATAL VISIT NOTE  Subjective:  Kaitlyn Whitaker is a 23 y.o. [id number] at [redacted]w[redacted]d being seen today for ongoing prenatal care.  She is currently monitored for the following issues for this low-risk pregnancy and has Pregnancy; Postpartum care and examination; Postpartum nipple pain; Nausea and vomiting in pregnancy prior to [redacted] weeks gestation; and Supervision of low-risk pregnancy on their problem list.  Patient reports no complaints.  Contractions: Not present. Vag. Bleeding: None.  Movement: Present. Denies leaking of fluid.   The following portions of the patient's history were reviewed and updated as appropriate: allergies, current medications, past family history, past medical history, past social history, past surgical history and problem list. Problem list updated.  Objective:   Vitals:   09/28/18 1408  BP: 118/76  Pulse: (!) 101  Weight: 105 lb 11.2 oz (47.9 kg)    Fetal Status: Fetal Heart Rate (bpm): 145   Movement: Present     General:  Alert, oriented and cooperative. Patient is in no acute distress.  Skin: Skin is warm and dry. No rash noted.   Cardiovascular: Normal heart rate noted  Respiratory: Normal respiratory effort, no problems with respiration noted  Abdomen: Soft, gravid, appropriate for gestational age.  Pain/Pressure: Absent     Pelvic: Cervical exam deferred        Extremities: Normal range of motion.  Edema: None  Mental Status: Normal mood and affect. Normal behavior. Normal judgment and thought content.   Assessment and Plan:  Pregnancy: G4P1021 at [redacted]w[redacted]d  1. Encounter for supervision of other normal pregnancy in second trimester - Routine care - AFP only today   Preterm labor symptoms and general obstetric precautions including but not limited to vaginal bleeding, contractions, leaking of fluid and fetal movement were reviewed in detail with the patient. Please refer to After Visit Summary for other counseling recommendations.  Return in about 4  weeks (around 10/26/2018).  Future Appointments  Date Time Provider Department Center  09/28/2018  2:35 PM Eugenio Hoes [facility]  10/27/2018  3:00 PM [facility] [place] [facility]    Thressa Sheller DNP, CNM  09/28/18  2:15 PM

## 2018-09-28 NOTE — Patient Instructions (Addendum)
AREA PEDIATRIC/FAMILY PRACTICE PHYSICIANS   [facility] [address], [address] [place], [place]  [address] Phone - [phone]   Fax - [phone]  [facility] [address] [place], [place] [address] Phone - [phone]   Fax - [phone]  JACK AMOS [address] [place], [place]  [address] Phone - [phone]   Fax - [phone]  [facility] [address] [place], [place]  [address] Phone - [phone]   Fax - [phone]  [facility] [address] [place], [place]  [address] Phone - [phone]   Fax - [phone]  [facility] [address] [place], [place]  [address] Phone - [phone]   Fax - [phone]  [facility] [address] [place], [place]  [address] Phone - [phone]   Fax - [phone]  [facility] [address] [place], [place]  [address] Phone - [phone]   Fax - [phone]  [facility] [address] [place], [place]  [address] Phone - [phone]   Fax - [phone] [facility] [address] [place], [place]  [address] Phone - [phone]   Fax - [phone]  [facility] [address] [place], [place]  [address] Phone - [phone]   Fax - [phone]  [facility] [address] [place], [place]  [address] Phone - [phone]   Fax - [phone]  [facility] [address] [place], [place]  [address] Phone - [phone]   Fax - [phone]  SHILPA GOSRANI [address] [place], [place]  [address] Phone - [phone]  [facility] [address] [place], [place]  [address] Phone - [phone]   Fax - [phone]  [facility] [address] [place], [place]  [address] Phone - [phone]   Fax - [phone]  [facility] [address] [place], [place]  [address] Phone - [phone]   Fax - [phone]  [facility] [address] [place], [place]  [address] Phone - [phone]   Fax - [phone]   [facility] [address] [place], [place]  [address] Phone - [phone]   Fax - [phone]  [facility] [address] [place], [place]  [address] Phone - [phone]   Fax - [phone]  DAVID RUBIN [address] [place], [place]  [address] Phone - [phone]   Fax - [phone]  [facility] [address] [place], [name]  [address] Phone - [phone]   Fax - [phone]  [facility] [address] , [place]  [address] Phone - [phone]   Fax - [phone] [facility] [address] [place], [place]  [address] Phone - [phone]   Fax - [phone]  [facility] [address] [place], [place]  [address] Phone - [phone]   Fax - [phone]  [facility] [address] [place], [place]  [address] Phone - [phone]   Fax - [phone]  [facility] Charlene Flemming MD [address] [place]  [address] Phone [phone]  Fax [phone]  Childbirth Education Options: [place] [facility] Classes:  Childbirth education classes can help you   get ready for a positive parenting experience. You can also meet other expectant parents and get free stuff for your baby. Each class runs for five weeks on the same night and costs $45 for the mother-to-be and her support person. Medicaid covers the cost if you are eligible. Call [phone] to register. [facility] Childbirth Education:  [phone] or [phone] or  [email or web address]  Baby & Me Class: Discuss newborn & infant parenting and family adjustment issues with other new mothers in a relaxed environment. Each week brings a new speaker or baby-centered activity. We encourage new mothers to join us every Thursday at 11:00am. Babies birth until crawling. No registration or fee. Daddy Boot Camp: This course offers Dads-to-be the tools and knowledge needed to feel confident on their journey to becoming new fathers. Experienced dads, who have been trained as coaches, teach dads-to-be how to hold, comfort, diaper, swaddle and play with their infant while being able to support the new mom as well. A class for men taught by men. $25/dad Big Brother/Big Sister: Let your children share in the joy of a new brother or sister in this special class designed just for them. Class includes discussion about how families care for babies: swaddling, holding, diapering, safety as well as how they can be helpful in their new role. This class is designed for children ages 2 to 6, but any age is welcome. Please register each child individually. $5/child  Mom Talk: This mom-led group offers support and connection to mothers as they journey through the adjustments and struggles of that sometimes overwhelming first year after the birth of a child. Tuesdays at 10:00am and Thursdays at 6:00pm. Babies welcome. No registration or fee. Breastfeeding Support Group: This group is a mother-to-mother support circle where moms have the opportunity to share their breastfeeding experiences. A Lactation Consultant is present for questions and concerns. Meets each Tuesday at 11:00am. No fee or registration. Breastfeeding Your Baby: Learn what to expect in the first days of breastfeeding your newborn.  This class will help you feel more confident with the skills needed to begin your breastfeeding experience. Many new mothers are concerned about breastfeeding after leaving the hospital. This class  will also address the most common fears and challenges about breastfeeding during the first few weeks, months and beyond. (call for fee) Comfort Techniques and Tour: This 2 hour interactive class will provide you the opportunity to learn & practice hands-on techniques that can help relieve some of the discomfort of labor and encourage your baby to rotate toward the best position for birth. You and your partner will be able to try a variety of labor positions with birth balls and rebozos as well as practice breathing, relaxation, and visualization techniques. A tour of the [facility] is included with this class. $20 per registrant and support person Childbirth Class- Weekend Option: This class is a Weekend version of our Birth & Baby series. It is designed for parents who have a difficult time fitting several weeks of classes into their schedule. It covers the care of your newborn and the basics of labor and childbirth. It also includes a Maternity Care Center Tour of [facility] and lunch. The class is held two consecutive days: beginning on Friday evening from 6:30 - 8:30 p.m. and the next day, Saturday from 9 a.m. - 4 p.m. (call for fee) Waterbirth Class: Interested in a waterbirth?  This informational class will help you discover whether waterbirth is the right fit for you.   Education about waterbirth itself, supplies you would need and how to assemble your support team is what you can expect from this class. Some obstetrical practices require this class in order to pursue a waterbirth. (Not all obstetrical practices offer waterbirth-check with your healthcare provider.) Register only the expectant mom, but you are encouraged to bring your partner to class! Required if planning waterbirth, no fee. Infant/Child CPR: Parents, grandparents, babysitters, and friends learn Cardio-Pulmonary Resuscitation skills for infants and children. You will also learn how to treat both conscious  and unconscious choking in infants and children. This Family & Friends program does not offer certification. Register each participant individually to ensure that enough mannequins are available. (Call for fee) Grandparent Love: Expecting a grandbaby? This class is for you! Learn about the latest infant care and safety recommendations and ways to support your own child as he or she transitions into the parenting role. Taught by Registered Nurses who are childbirth instructors, but most importantly...they are grandmothers too! $10/person. Childbirth Class- Natural Childbirth: This series of 5 weekly classes is for expectant parents who want to learn and practice natural methods of coping with the process of labor and childbirth. Relaxation, breathing, massage, visualization, role of the partner, and helpful positioning are highlighted. Participants learn how to be confident in their body's ability to give birth. This class will empower and help parents make informed decisions about their own care. Includes discussion that will help new parents transition into the immediate postpartum period. [facility] is included. We suggest taking this class between 25-32 weeks, but it's only a recommendation. $75 per registrant and one support person or $30 Medicaid. Childbirth Class- 3 week Series: This option of 3 weekly classes helps you and your labor partner prepare for childbirth. Newborn care, labor & birth, cesarean birth, pain management, and comfort techniques are discussed and a [place] [facility] is included. The class meets at the same time, on the same day of the week for 3 consecutive weeks beginning with the starting date you choose. $60 for registrant and one support person.  Marvelous Multiples: Expecting twins, triplets, or more? This class covers the differences in labor, birth, parenting, and breastfeeding issues that face multiples' parents.  NICU tour is included. Led by a Certified Childbirth Educator who is the mother of twins. No fee. Caring for Baby: This class is for expectant and adoptive parents who want to learn and practice the most up-to-date newborn care for their babies. Focus is on birth through the first six weeks of life. Topics include feeding, bathing, diapering, crying, umbilical cord care, circumcision care and safe sleep. Parents learn to recognize symptoms of illness and when to call the pediatrician. Register only the mom-to-be and your partner or support person can plan to come with you! $10 per registrant and support person Childbirth Class- online option: This online class offers you the freedom to complete a Birth and Baby series in the comfort of your own home. The flexibility of this option allows you to review sections at your own pace, at times convenient to you and your support people. It includes additional video information, animations, quizzes, and extended activities. Get organized with helpful eClass tools, checklists, and trackers. Once you register online for the class, you will receive an email within a few days to accept the invitation and begin the class when the time is right for you. The content will be available to you for 60 days. $  60 for 60 days of online access for you and your support people.  Local Doulas: Natural Baby Doulas [email or web address] Tel: [phone] [email or web address] [phone] [email or web address] The Labor Merla Riches  (also do waterbirth tub rental) [phone] [email or web address] [email or web address] Doula [phone] [email or web address] [facility] Rhythms  [phone] [email or web address] [employer] (PADA) [email or web address] [place] Birth [facility] Baby   [email or web address] Considering Waterbirth? Guide for patients at [facility] [employer]  Why consider waterbirth?  . Gentle birth for babies . Less pain medicine used in labor . May allow for passive descent/less pushing . May reduce perineal tears  . More mobility and instinctive maternal position changes . Increased maternal relaxation . Reduced blood pressure in labor  Is waterbirth safe? What are the risks of infection, drowning or other complications?  . Infection: o Very low risk (3.7 % for tub vs 4.8% for bed) o 7 in 8000 waterbirths with documented infection o Poorly cleaned equipment most common cause o Slightly lower group B strep transmission rate  . Drowning o Maternal:  - Very low risk   - Related to seizures or fainting o Newborn:  - Very low risk. No evidence of increased risk of respiratory problems in multiple large studies - Physiological protection from breathing under water - Avoid underwater birth if there are any fetal complications - Once baby's head is out of the water, keep it out.  . Birth complication o Some reports of cord trauma, but risk decreased by bringing baby to surface gradually o No evidence of increased risk of shoulder dystocia. Mothers can usually change positions faster in water than in a bed, possibly aiding the maneuvers to free the shoulder.   You must attend a [place] Dolin class at [facility]  3rd Wednesday of every month from 7-9pm  [employer] by calling [phone] or online at [email or web address]  Bring [place] the certificate from the class to your prenatal appointment  Meet with a midwife at 36 weeks to see if you can still plan a waterbirth and to sign the consent.   Purchase or rent the following supplies:   Fish net  Bathing suit top (optional)  Long-handled mirror (optional)  Things that would prevent you from having a waterbirth:  Premature, <37wks  Previous cesarean  birth  Presence of thick meconium-stained fluid  Multiple gestation (Twins, triplets, etc.)  Uncontrolled diabetes or gestational diabetes requiring medication  Hypertension requiring medication or diagnosis of pre-eclampsia  Heavy vaginal bleeding  Non-reassuring fetal heart rate  Active infection (MRSA, etc.). Group B Strep is NOT a contraindication for  waterbirth.  If your labor has to be induced and induction method requires continuous  monitoring of the baby's heart rate  Other risks/issues identified by your obstetrical provider  Please remember that birth is unpredictable. Under certain unforeseeable circumstances your provider may advise against giving birth in the tub. These decisions will be made on a case-by-case basis and with the safety of you and your baby as our highest priority.

## 2018-09-30 LAB — AFP, SERUM, OPEN SPINA BIFIDA
AFP MoM: 0.87
AFP Value: 56.1 ng/mL
Gest. Age on Collection Date: 19.1 weeks
Maternal Age At EDD: 23 yr
OSBR RISK 1 IN: 10000
Test Results:: NEGATIVE
Weight: 105 [lb_av]

## 2018-10-02 ENCOUNTER — Other Ambulatory Visit: Payer: Self-pay | Admitting: Advanced Practice Midwife

## 2018-10-27 ENCOUNTER — Encounter: Payer: Self-pay | Admitting: Student

## 2018-10-27 ENCOUNTER — Ambulatory Visit (HOSPITAL_COMMUNITY): Payer: Medicaid Other

## 2018-10-28 ENCOUNTER — Ambulatory Visit (HOSPITAL_COMMUNITY)
Admission: RE | Admit: 2018-10-28 | Discharge: 2018-10-28 | Disposition: A | Discharge status: Home / Self Care | Payer: Medicaid Other | Source: Ambulatory Visit | Attending: Advanced Practice Midwife | Admitting: Advanced Practice Midwife

## 2018-10-28 ENCOUNTER — Other Ambulatory Visit (HOSPITAL_COMMUNITY): Payer: Self-pay | Admitting: *Deleted

## 2018-10-28 ENCOUNTER — Encounter: Payer: Self-pay | Admitting: Medical

## 2018-10-28 ENCOUNTER — Other Ambulatory Visit: Payer: Self-pay

## 2018-10-28 ENCOUNTER — Ambulatory Visit: Payer: Medicaid Other | Admitting: Medical

## 2018-10-28 DIAGNOSIS — Z362 Encounter for other antenatal screening follow-up: Secondary | ICD-10-CM | POA: Insufficient documentation

## 2018-10-28 DIAGNOSIS — Z3A23 23 weeks gestation of pregnancy: Secondary | ICD-10-CM | POA: Diagnosis not present

## 2018-10-28 DIAGNOSIS — Z3492 Encounter for supervision of normal pregnancy, unspecified, second trimester: Secondary | ICD-10-CM | POA: Diagnosis not present

## 2018-10-28 NOTE — Progress Notes (Signed)
Medicaid Home Form Completed-10/28/18 

## 2018-10-28 NOTE — Progress Notes (Signed)
   PRENATAL VISIT NOTE  Subjective:  Kaitlyn Whitaker is a 23 y.o. G4P1021 at [redacted]w[redacted]d being seen today for ongoing prenatal care.  She is currently monitored for the following issues for this low-risk pregnancy and has Nausea and vomiting in pregnancy prior to [redacted] weeks gestation and Supervision of low-risk pregnancy on their problem list.  Patient reports no complaints.  Contractions: Not present. Vag. Bleeding: None.  Movement: Present. Denies leaking of fluid.   The following portions of the patient's history were reviewed and updated as appropriate: allergies, current medications, past family history, past medical history, past social history, past surgical history and problem list. Problem list updated.  Objective:   Vitals:   10/28/18 1629  BP: 97/70  Pulse: (!) 108  Weight: 113 lb (51.3 kg)    Fetal Status: Fetal Heart Rate (bpm): 159 Fundal Height: 21 cm Movement: Present     General:  Alert, oriented and cooperative. Patient is in no acute distress.  Skin: Skin is warm and dry. No rash noted.   Cardiovascular: Normal heart rate noted  Respiratory: Normal respiratory effort, no problems with respiration noted  Abdomen: Soft, gravid, appropriate for gestational age.  Pain/Pressure: Present     Pelvic: Cervical exam deferred        Extremities: Normal range of motion.  Edema: None  Mental Status: Normal mood and affect. Normal behavior. Normal judgment and thought content.   Assessment and Plan:  Pregnancy: G4P1021 at [redacted]w[redacted]d  1. Encounter for supervision of low-risk pregnancy in second trimester - N/V has resolved - [place] today still with incomplete views of spine, repeat scheduled in 4 weeks by MFM  Preterm labor symptoms and general obstetric precautions including but not limited to vaginal bleeding, contractions, leaking of fluid and fetal movement were reviewed in detail with the patient. Please refer to After Visit Summary for other counseling recommendations.  Return in  about 4 weeks (around 11/25/2018) for LOB, 28 week labs (fasting).  Future Appointments  Date Time Provider Department Center  11/25/2018  3:15 PM WH-MFC [place] 4 WH-MFCUS M[facility]-US    Vonzella Nipple, PA-C

## 2018-10-28 NOTE — Patient Instructions (Signed)

## 2018-11-03 DIAGNOSIS — Z029 Encounter for administrative examinations, unspecified: Secondary | ICD-10-CM

## 2018-11-09 ENCOUNTER — Encounter: Payer: Self-pay | Admitting: Family Medicine

## 2018-11-19 ENCOUNTER — Telehealth: Payer: Self-pay

## 2018-11-19 NOTE — Telephone Encounter (Signed)
Called pt and advised of appointment and precautions. Pt verbalized understanding.

## 2018-11-23 ENCOUNTER — Other Ambulatory Visit: Payer: Self-pay

## 2018-11-23 ENCOUNTER — Other Ambulatory Visit: Payer: Self-pay | Admitting: *Deleted

## 2018-11-23 ENCOUNTER — Encounter: Payer: Self-pay | Admitting: Advanced Practice Midwife

## 2018-11-23 ENCOUNTER — Other Ambulatory Visit: Payer: Medicaid Other

## 2018-11-23 ENCOUNTER — Ambulatory Visit (INDEPENDENT_AMBULATORY_CARE_PROVIDER_SITE_OTHER): Payer: Medicaid Other | Admitting: Advanced Practice Midwife

## 2018-11-23 VITALS — BP 102/62 | HR 81 | Temp 97.9°F | Wt 121.1 lb

## 2018-11-23 DIAGNOSIS — Z23 Encounter for immunization: Secondary | ICD-10-CM | POA: Diagnosis present

## 2018-11-23 DIAGNOSIS — Z3A27 27 weeks gestation of pregnancy: Secondary | ICD-10-CM

## 2018-11-23 DIAGNOSIS — Z3403 Encounter for supervision of normal first pregnancy, third trimester: Secondary | ICD-10-CM

## 2018-11-23 DIAGNOSIS — Z3492 Encounter for supervision of normal pregnancy, unspecified, second trimester: Secondary | ICD-10-CM

## 2018-11-23 DIAGNOSIS — Z3480 Encounter for supervision of other normal pregnancy, unspecified trimester: Secondary | ICD-10-CM

## 2018-11-23 DIAGNOSIS — Z3482 Encounter for supervision of other normal pregnancy, second trimester: Secondary | ICD-10-CM

## 2018-11-23 NOTE — Patient Instructions (Signed)
Coronavirus (COVID-19) Are you at risk?  Are you at risk for the Coronavirus (COVID-19)?  To be considered HIGH RISK for Coronavirus (COVID-19), you have to meet the following criteria:  . Traveled to [place], [place], [place], [place] or [place]; or in the [place] to [place], [place], [place], or [place]; and have fever, cough, and shortness of breath within the last 2 weeks of travel OR . Been in close contact with a person diagnosed with COVID-19 within the last 2 weeks and have fever, cough, and shortness of breath . IF YOU DO NOT MEET THESE CRITERIA, YOU ARE CONSIDERED LOW RISK FOR COVID-19.  What to do if you are HIGH RISK for COVID-19?  . If you are having a medical emergency, call 911. . Seek medical care right away. Before you go to a doctor's office, urgent care or emergency department, call ahead and tell them about your recent travel, contact with someone diagnosed with COVID-19, and your symptoms. You should receive instructions from your physician's office regarding next steps of care.  . When you arrive at healthcare provider, tell the healthcare staff immediately you have returned from visiting [place], [place], [place], [place] or [place]; or traveled in the [place] to [place], [place], [place], or [place]; in the last two weeks or you have been in close contact with a person diagnosed with COVID-19 in the last 2 weeks.   . Tell the health care staff about your symptoms: fever, cough and shortness of breath. . After you have been seen by a medical provider, you will be either: o Tested for (COVID-19) and discharged home on quarantine except to seek medical care if symptoms worsen, and asked to  - Stay home and avoid contact with others until you get your results (4-5 days)  - Avoid travel on public transportation if possible (such as bus, train, or airplane) or o Sent to the Emergency Department by EMS for evaluation, COVID-19 testing, and possible  admission depending on your condition and test results.  What to do if you are LOW RISK for COVID-19?  Reduce your risk of any infection by using the same precautions used for avoiding the common cold or flu:  . Wash your hands often with soap and warm water for at least 20 seconds.  If soap and water are not readily available, use an alcohol-based hand sanitizer with at least 60% alcohol.  . If coughing or sneezing, cover your mouth and nose by coughing or sneezing into the elbow areas of your shirt or coat, into a tissue or into your sleeve (not your hands). . Avoid shaking hands with others and consider head nods or verbal greetings only. . Avoid touching your eyes, nose, or mouth with unwashed hands.  . Avoid close contact with people who are sick. . Avoid places or events with large numbers of people in one location, like concerts or sporting events. . Carefully consider travel plans you have or are making. . If you are planning any travel outside or inside the [place], visit the CDC's Travelers' Health webpage for the latest health notices. . If you have some symptoms but not all symptoms, continue to monitor at home and seek medical attention if your symptoms worsen. . If you are having a medical emergency, call 911.   ADDITIONAL HEALTHCARE OPTIONS FOR PATIENTS  [facility] / e-Visit: [email or web address]         [facility]: [phone]  [facility]: [phone]                   [facility]: [phone]   

## 2018-11-23 NOTE — Progress Notes (Signed)
   PRENATAL VISIT NOTE  Subjective:  Kaitlyn Whitaker is a 23 y.o. G4P1021 at [redacted]w[redacted]d being seen today for ongoing prenatal care.  She is currently monitored for the following issues for this low-risk pregnancy and has Nausea and vomiting in pregnancy prior to [redacted] weeks gestation; Supervision of low-risk pregnancy; and Late Entry Baby Scripts March 2020-social distancing on their problem list.  Patient reports no complaints.  Contractions: Not present. Vag. Bleeding: None.  Movement: Present. Denies leaking of fluid.   The following portions of the patient's history were reviewed and updated as appropriate: allergies, current medications, past family history, past medical history, past social history, past surgical history and problem list.   Objective:   Vitals:   11/23/18 0830  BP: 102/62  Pulse: 81  Temp: 97.9 F (36.6 C)  Weight: 121 lb 1.6 oz (54.9 kg)    Fetal Status: Fetal Heart Rate (bpm): 150 Fundal Height: 26 cm Movement: Present     General:  Alert, oriented and cooperative. Patient is in no acute distress.  Skin: Skin is warm and dry. No rash noted.   Cardiovascular: Normal heart rate noted  Respiratory: Normal respiratory effort, no problems with respiration noted  Abdomen: Soft, gravid, appropriate for gestational age.  Pain/Pressure: Present     Pelvic: Cervical exam deferred        Extremities: Normal range of motion.  Edema: None  Mental Status: Normal mood and affect. Normal behavior. Normal judgment and thought content.   Assessment and Plan:  Pregnancy: G4P1021 at [redacted]w[redacted]d  1. Encounter for supervision of normal first pregnancy in third trimester - Babyscripts Schedule Optimization - Patient wants to wean from SSRIs, see weaning schedule to discuss at next visit   2. Late Entry Baby Scripts March 2020-social distancing - Will put on optimized babyscripts schedule - Next visit can be telehealth visit at 32 weeks - Then will return at 36 weeks for cultures -  GTT today  SSRI weaning guidance: Lexapro, take 1/2 a tablet x 1 week Then take 1/2 tablet every other day for 1 week Then take 1/2 tablet every 3 days for 1 week Ok to stop after that.  Cymbalta, take 1 capsule every other day for 1 week Then take 1 capsule every 3 days for 1 week Then take 1 capsule as needed if experiencing symptoms (such as fuzzy head etc)   Preterm labor symptoms and general obstetric precautions including but not limited to vaginal bleeding, contractions, leaking of fluid and fetal movement were reviewed in detail with the patient. Please refer to After Visit Summary for other counseling recommendations.   Return in about 5 weeks (around 12/28/2018) for tele-health visit, babyscripts optimized schedule .  Future Appointments  Date Time Provider Department Center  11/26/2018  4:00 PM WH-MFC [place] 3 WH-MFCUS MFC-US   Thressa Sheller DNP, CNM  11/23/18  9:10 AM

## 2018-11-24 ENCOUNTER — Encounter: Payer: Self-pay | Admitting: *Deleted

## 2018-11-24 LAB — CBC
HEMATOCRIT: 31.2 % — AB (ref 34.0–46.6)
HEMOGLOBIN: 10.9 g/dL — AB (ref 11.1–15.9)
MCH: 30.2 pg (ref 26.6–33.0)
MCHC: 34.9 g/dL (ref 31.5–35.7)
MCV: 86 fL (ref 79–97)
PLATELETS: 286 10*3/uL (ref 150–450)
RBC: 3.61 x10E6/uL — AB (ref 3.77–5.28)
RDW: 11.9 % (ref 11.7–15.4)
WBC: 8.2 10*3/uL (ref 3.4–10.8)

## 2018-11-24 LAB — GLUCOSE TOLERANCE, 2 HOURS W/ 1HR
Glucose, 1 hour: 97 mg/dL (ref 65–179)
Glucose, 2 hour: 70 mg/dL (ref 65–152)
Glucose, Fasting: 76 mg/dL (ref 65–91)

## 2018-11-24 LAB — HIV ANTIBODY (ROUTINE TESTING W REFLEX): HIV Screen 4th Generation wRfx: NONREACTIVE

## 2018-11-24 LAB — RPR: RPR Ser Ql: NONREACTIVE

## 2018-11-24 MED ORDER — DOCUSATE SODIUM 100 MG PO CAPS: 100.0000 mg | ORAL_CAPSULE | Freq: Two times a day (BID) | ORAL | 1 refills | Status: DC | PRN

## 2018-11-24 MED ORDER — FERROUS SULFATE 325 (65 FE) MG PO TABS: 325.0000 mg | ORAL_TABLET | Freq: Every day | ORAL | 3 refills | Status: DC

## 2018-11-25 ENCOUNTER — Ambulatory Visit (HOSPITAL_COMMUNITY): Payer: Medicaid Other

## 2018-11-26 ENCOUNTER — Other Ambulatory Visit: Payer: Self-pay

## 2018-11-26 ENCOUNTER — Ambulatory Visit (HOSPITAL_COMMUNITY)
Admission: RE | Admit: 2018-11-26 | Discharge: 2018-11-26 | Disposition: A | Discharge status: Home / Self Care | Payer: Medicaid Other | Source: Ambulatory Visit | Attending: Maternal & Fetal Medicine | Admitting: Maternal & Fetal Medicine

## 2018-11-26 DIAGNOSIS — Z362 Encounter for other antenatal screening follow-up: Secondary | ICD-10-CM | POA: Diagnosis present

## 2018-11-26 DIAGNOSIS — Z3A27 27 weeks gestation of pregnancy: Secondary | ICD-10-CM | POA: Diagnosis not present

## 2018-12-22 ENCOUNTER — Encounter: Payer: Self-pay | Admitting: General Practice

## 2018-12-28 ENCOUNTER — Encounter: Payer: Self-pay | Admitting: Advanced Practice Midwife

## 2018-12-28 ENCOUNTER — Ambulatory Visit (INDEPENDENT_AMBULATORY_CARE_PROVIDER_SITE_OTHER): Payer: Medicaid Other | Admitting: Advanced Practice Midwife

## 2018-12-28 ENCOUNTER — Other Ambulatory Visit: Payer: Self-pay

## 2018-12-28 ENCOUNTER — Telehealth: Payer: Self-pay | Admitting: Advanced Practice Midwife

## 2018-12-28 DIAGNOSIS — Z3A32 32 weeks gestation of pregnancy: Secondary | ICD-10-CM

## 2018-12-28 DIAGNOSIS — Z3403 Encounter for supervision of normal first pregnancy, third trimester: Secondary | ICD-10-CM

## 2018-12-28 NOTE — Progress Notes (Signed)
   TELEHEALTH VIRTUAL OBSTETRICS VISIT ENCOUNTER NOTE  I connected with Kaitlyn Whitaker on 12/28/18 at  1:15 PM EDT by telephone at home and verified that I am speaking with the correct person using two identifiers.   I discussed the limitations, risks, security and privacy concerns of performing an evaluation and management service by telephone and the availability of in person appointments. I also discussed with the patient that there may be a patient responsible charge related to this service. The patient expressed understanding and agreed to proceed.  Subjective:  Kaitlyn Whitaker is a 23 y.o. G4P1021 at [redacted]w[redacted]d being followed for ongoing prenatal care.  She is currently monitored for the following issues for this low-risk pregnancy and has Nausea and vomiting in pregnancy prior to [redacted] weeks gestation; Supervision of low-risk pregnancy; and Late Entry Baby Scripts March 2020-social distancing on their problem list.  Patient reports no complaints. Reports fetal movement. Denies any contractions, bleeding or leaking of fluid.   The following portions of the patient's history were reviewed and updated as appropriate: allergies, current medications, past family history, past medical history, past social history, past surgical history and problem list.   Objective:   General:  Alert, oriented and cooperative.   Mental Status: Normal mood and affect perceived. Normal judgment and thought content.  Rest of physical exam deferred due to type of encounter  Assessment and Plan:  Pregnancy: G4P1021 at [redacted]w[redacted]d 1. Encounter for supervision of normal first pregnancy in third trimester - Patient has exams this, and then would like to start weaning from SSRI - Discussed weaning schedule. Patient verbalizes understanding - Baby scripts data reviewed   Preterm labor symptoms and general obstetric precautions including but not limited to vaginal bleeding, contractions, leaking of fluid and fetal movement  were reviewed in detail with the patient.  I discussed the assessment and treatment plan with the patient. The patient was provided an opportunity to ask questions and all were answered. The patient agreed with the plan and demonstrated an understanding of the instructions. The patient was advised to call back or seek an in-person office evaluation/go to MAU at [facility] for any urgent or concerning symptoms. Please refer to After Visit Summary for other counseling recommendations.   I provided 15 minutes of non-face-to-face time during this encounter.  Return in about 4 weeks (around 01/25/2019) for in person visit for 36 week labs .  No future appointments.  Thressa Sheller DNP, CNM  12/28/18  1:46 PM  Center for [employer], [facility]

## 2018-12-28 NOTE — Progress Notes (Signed)
I connected with  [name] on 12/28/18 at  1:15 PM EDT by telephone and verified that I am speaking with the correct person using two identifiers.   I discussed the limitations, risks, security and privacy concerns of performing an evaluation and management service by telephone and the availability of in person appointments. I also discussed with the patient that there may be a patient responsible charge related to this service. The patient expressed understanding and agreed to proceed.  Janene Madeira Batul Diego, CMA 12/28/2018  1:40 PM

## 2018-12-28 NOTE — Telephone Encounter (Signed)
Called the patient to inform of upcoming virtual appointment. The patient verbalized understanding.

## 2019-01-26 ENCOUNTER — Other Ambulatory Visit: Payer: Self-pay

## 2019-01-26 ENCOUNTER — Ambulatory Visit (INDEPENDENT_AMBULATORY_CARE_PROVIDER_SITE_OTHER): Payer: Medicaid Other | Admitting: Student

## 2019-01-26 ENCOUNTER — Other Ambulatory Visit (HOSPITAL_COMMUNITY)
Admission: RE | Admit: 2019-01-26 | Discharge: 2019-01-26 | Disposition: A | Discharge status: Home / Self Care | Payer: Medicaid Other | Source: Ambulatory Visit | Attending: Student | Admitting: Student

## 2019-01-26 ENCOUNTER — Telehealth: Payer: Self-pay | Admitting: Student

## 2019-01-26 VITALS — BP 125/85 | HR 117 | Temp 98.4°F | Wt 135.9 lb

## 2019-01-26 DIAGNOSIS — Z3492 Encounter for supervision of normal pregnancy, unspecified, second trimester: Secondary | ICD-10-CM

## 2019-01-26 DIAGNOSIS — O26843 Uterine size-date discrepancy, third trimester: Secondary | ICD-10-CM

## 2019-01-26 DIAGNOSIS — Z3A36 36 weeks gestation of pregnancy: Secondary | ICD-10-CM

## 2019-01-26 NOTE — Telephone Encounter (Signed)
Called the patient to confirm upcoming appointment. The patient verbalized understanding. °

## 2019-01-26 NOTE — Patient Instructions (Signed)

## 2019-01-26 NOTE — Progress Notes (Signed)
Ultrasound for growth scheduled for 1430 on Friday 01/29/19.

## 2019-01-26 NOTE — Progress Notes (Signed)
   PRENATAL VISIT NOTE  Subjective:  Kaitlyn Whitaker is a 23 y.o. G4P1021 at [redacted]w[redacted]d being seen today for ongoing prenatal care.  She is currently monitored for the following issues for this low-risk pregnancy and has Nausea and vomiting in pregnancy prior to [redacted] weeks gestation; Supervision of low-risk pregnancy; and Late Entry Baby Scripts March 2020-social distancing on their problem list.  Patient reports feeling good. She is staying busy with work. She was on cymbalta and lexapro. .  Contractions: Not present. Vag. Bleeding: None.  Movement: Present. Denies leaking of fluid.   The following portions of the patient's history were reviewed and updated as appropriate: allergies, current medications, past family history, past medical history, past social history, past surgical history and problem list.   Objective:   Vitals:   01/26/19 1337  BP: 125/85  Pulse: (!) 117  Temp: 98.4 F (36.9 C)  Weight: 135 lb 14.4 oz (61.6 kg)    Fetal Status: Fetal Heart Rate (bpm): 136 Fundal Height: 30 cm Movement: Present  Presentation: Vertex  General:  Alert, oriented and cooperative. Patient is in no acute distress.  Skin: Skin is warm and dry. No rash noted.   Cardiovascular: Normal heart rate noted  Respiratory: Normal respiratory effort, no problems with respiration noted  Abdomen: Soft, gravid, appropriate for gestational age.  Pain/Pressure: Absent     Pelvic: Cervical exam deferred Dilation: 1 Effacement (%): Thick Station: Ballotable  Extremities: Normal range of motion.  Edema: None  Mental Status: Normal mood and affect. Normal behavior. Normal judgment and thought content.   Assessment and Plan:  Pregnancy: G4P1021 at [redacted]w[redacted]d 1. Encounter for supervision of low-risk pregnancy in second trimester -Reviewed BP in her BabyRx but patient missed last; she plans to continue checking weekly.  - GC/Chlamydia probe amp ([place])not at [facility] - Culture, beta strep (group b only) -My Chart  appt for next 3 weeks, then inperson visit with provider and NST/BPP 2. Uterine size-date discrepancy in third trimester  - [place] MFM OB FOLLOW UP; Future  Term labor symptoms and general obstetric precautions including but not limited to vaginal bleeding, contractions, leaking of fluid and fetal movement were reviewed in detail with the patient. Please refer to After Visit Summary for other counseling recommendations.   Return weekly mychart visits for 3 weeks; then NST/BPP and provider after 6-21.  Future Appointments  Date Time Provider Department Center  01/29/2019  2:30 PM [facility] [place] 1 [facility] M[facility]-US    Marylene Land, CNM

## 2019-01-27 LAB — GC/CHLAMYDIA PROBE AMP (~~LOC~~) NOT AT ARMC
Chlamydia: NEGATIVE
Neisseria Gonorrhea: NEGATIVE

## 2019-01-29 ENCOUNTER — Ambulatory Visit (HOSPITAL_COMMUNITY)
Admission: RE | Admit: 2019-01-29 | Discharge: 2019-01-29 | Disposition: A | Discharge status: Home / Self Care | Payer: Medicaid Other | Source: Ambulatory Visit | Attending: Obstetrics and Gynecology | Admitting: Obstetrics and Gynecology

## 2019-01-29 ENCOUNTER — Other Ambulatory Visit: Payer: Self-pay

## 2019-01-29 DIAGNOSIS — Z3A36 36 weeks gestation of pregnancy: Secondary | ICD-10-CM | POA: Diagnosis not present

## 2019-01-29 DIAGNOSIS — Z362 Encounter for other antenatal screening follow-up: Secondary | ICD-10-CM

## 2019-01-29 DIAGNOSIS — O26843 Uterine size-date discrepancy, third trimester: Secondary | ICD-10-CM | POA: Insufficient documentation

## 2019-01-30 LAB — CULTURE, BETA STREP (GROUP B ONLY): Strep Gp B Culture: NEGATIVE

## 2019-02-02 ENCOUNTER — Other Ambulatory Visit: Payer: Self-pay

## 2019-02-02 ENCOUNTER — Ambulatory Visit (INDEPENDENT_AMBULATORY_CARE_PROVIDER_SITE_OTHER): Payer: Medicaid Other

## 2019-02-02 VITALS — BP 128/86 | HR 93

## 2019-02-02 DIAGNOSIS — Z3403 Encounter for supervision of normal first pregnancy, third trimester: Secondary | ICD-10-CM | POA: Diagnosis not present

## 2019-02-02 DIAGNOSIS — Z3A37 37 weeks gestation of pregnancy: Secondary | ICD-10-CM | POA: Diagnosis not present

## 2019-02-02 NOTE — Progress Notes (Signed)
   TELEHEALTH VIRTUAL OBSTETRICS VISIT ENCOUNTER NOTE  I connected with Kaitlyn Whitaker on 02/02/19 at  2:55 PM EDT by MyChart Virtual Visit at home and verified that I am speaking with the correct person using two identifiers.   I discussed the limitations, risks, security and privacy concerns of performing an evaluation and management service by telephone and the availability of in person appointments. I also discussed with the patient that there may be a patient responsible charge related to this service. The patient expressed understanding and agreed to proceed.  Subjective:  Kaitlyn Whitaker is a 23 y.o. G4P1021 at [redacted]w[redacted]d being followed for ongoing prenatal care.  She is currently monitored for the following issues for this low-risk pregnancy and has Nausea and vomiting in pregnancy prior to [redacted] weeks gestation; Supervision of low-risk pregnancy; and Late Entry Baby Scripts March 2020-social distancing on their problem list.  Patient reports no complaints. Reports fetal movement. Denies any contractions, bleeding or leaking of fluid.   The following portions of the patient's history were reviewed and updated as appropriate: allergies, current medications, past family history, past medical history, past social history, past surgical history and problem list.   Objective:   General:  Alert, oriented and cooperative.   Mental Status: Normal mood and affect perceived. Normal judgment and thought content.  Rest of physical exam deferred due to type of encounter  Assessment and Plan:  Pregnancy: G4P1021 at [redacted]w[redacted]d 1. Encounter for supervision of normal first pregnancy in third trimester -BP 128/86 -Doing well with weaning SSRIs -Desires minipill for contraception.  -Counseled patient on routine COVID-19 testing for all admission to inpatient units whether direct admit or from MAU/ED. Reviewed reasons for testing including identifying cases and protecting patients/staff from possible  infection. Reassured patient that separation from newborn is not required for COVID+ tests in asymptomatic patients and that the plan of care will be created with Peds through shared decision-making. One support person is allowed for all admitted patients regardless of COVID test results. All patient questions answered.     Preterm labor symptoms and general obstetric precautions including but not limited to vaginal bleeding, contractions, leaking of fluid and fetal movement were reviewed in detail with the patient.  I discussed the assessment and treatment plan with the patient. The patient was provided an opportunity to ask questions and all were answered. The patient agreed with the plan and demonstrated an understanding of the instructions. The patient was advised to call back or seek an in-person office evaluation/go to MAU at [facility] for any urgent or concerning symptoms. Please refer to After Visit Summary for other counseling recommendations.   I provided 10 minutes of non-face-to-face time during this encounter.  Return in about 1 week (around 02/09/2019) for Return OB visit.  Future Appointments  Date Time Provider Department Center  02/09/2019 10:55 AM Rolm Bookbinder, CNM [facility]  02/16/2019  2:35 PM Rolm Bookbinder, CNM [facility]  02/22/2019 10:55 AM [place] Bing, MD [facility] WOC    Rolm Bookbinder, CNM Center for [employer], [facility]

## 2019-02-02 NOTE — Progress Notes (Signed)
I connected with  Kaitlyn Whitaker on 02/02/19 at  2:55 PM EDT by telephone and verified that I am speaking with the correct person using two identifiers.   I discussed the limitations, risks, security and privacy concerns of performing an evaluation and management service by telephone and the availability of in person appointments. I also discussed with the patient that there may be a patient responsible charge related to this service. The patient expressed understanding and agreed to proceed.  Osvaldo Human, RN 02/02/2019  2:51 PM

## 2019-02-08 ENCOUNTER — Telehealth: Payer: Self-pay

## 2019-02-08 NOTE — Telephone Encounter (Signed)
Called the patient to confirm the mychart visit for tomorrow. The patient verbalized understanding and stated she is aware of how to access.

## 2019-02-09 ENCOUNTER — Encounter (HOSPITAL_COMMUNITY): Payer: Self-pay

## 2019-02-09 ENCOUNTER — Inpatient Hospital Stay (EMERGENCY_DEPARTMENT_HOSPITAL)
Admission: AD | Admit: 2019-02-09 | Discharge: 2019-02-09 | Disposition: A | Discharge status: Home / Self Care | Payer: Medicaid Other | Source: Home / Self Care | Attending: Obstetrics & Gynecology | Admitting: Obstetrics & Gynecology

## 2019-02-09 ENCOUNTER — Other Ambulatory Visit: Payer: Self-pay

## 2019-02-09 ENCOUNTER — Telehealth (INDEPENDENT_AMBULATORY_CARE_PROVIDER_SITE_OTHER): Payer: Medicaid Other

## 2019-02-09 VITALS — BP 126/107 | HR 130

## 2019-02-09 DIAGNOSIS — Z87891 Personal history of nicotine dependence: Secondary | ICD-10-CM | POA: Insufficient documentation

## 2019-02-09 DIAGNOSIS — Z3A38 38 weeks gestation of pregnancy: Secondary | ICD-10-CM

## 2019-02-09 DIAGNOSIS — Z3403 Encounter for supervision of normal first pregnancy, third trimester: Secondary | ICD-10-CM

## 2019-02-09 DIAGNOSIS — O133 Gestational [pregnancy-induced] hypertension without significant proteinuria, third trimester: Secondary | ICD-10-CM

## 2019-02-09 HISTORY — DX: Anemia, unspecified: D64.9

## 2019-02-09 LAB — CBC
HCT: 35.3 % — ABNORMAL LOW (ref 36.0–46.0)
Hemoglobin: 12.2 g/dL (ref 12.0–15.0)
MCH: 29.9 pg (ref 26.0–34.0)
MCHC: 34.6 g/dL (ref 30.0–36.0)
MCV: 86.5 fL (ref 80.0–100.0)
Platelets: 264 10*3/uL (ref 150–400)
RBC: 4.08 MIL/uL (ref 3.87–5.11)
RDW: 13.8 % (ref 11.5–15.5)
WBC: 9.4 10*3/uL (ref 4.0–10.5)
nRBC: 0 % (ref 0.0–0.2)

## 2019-02-09 LAB — COMPREHENSIVE METABOLIC PANEL
ALT: 13 U/L (ref 0–44)
AST: 26 U/L (ref 15–41)
Albumin: 2.7 g/dL — ABNORMAL LOW (ref 3.5–5.0)
Alkaline Phosphatase: 309 U/L — ABNORMAL HIGH (ref 38–126)
Anion gap: 11 (ref 5–15)
BUN: 8 mg/dL (ref 6–20)
CO2: 18 mmol/L — ABNORMAL LOW (ref 22–32)
Calcium: 9.1 mg/dL (ref 8.9–10.3)
Chloride: 106 mmol/L (ref 98–111)
Creatinine, Ser: 0.72 mg/dL (ref 0.44–1.00)
GFR calc Af Amer: >60 mL/min (ref >60)
GFR calc non Af Amer: >60 mL/min (ref >60)
Glucose, Bld: 116 mg/dL — ABNORMAL HIGH (ref 70–99)
Potassium: 3.9 mmol/L (ref 3.5–5.1)
Sodium: 135 mmol/L (ref 135–145)
Total Bilirubin: 0.4 mg/dL (ref 0.3–1.2)
Total Protein: 6.4 g/dL — ABNORMAL LOW (ref 6.5–8.1)

## 2019-02-09 LAB — PROTEIN / CREATININE RATIO, URINE
Creatinine, Urine: 125.98 mg/dL
Protein Creatinine Ratio: 0.17 mg/mg{Cre} — ABNORMAL HIGH (ref 0.00–0.15)
Total Protein, Urine: 22 mg/dL

## 2019-02-09 NOTE — Progress Notes (Signed)
I connected with  Kaitlyn Whitaker on 02/09/19 at 10:55 AM EDT by telephone and verified that I am speaking with the correct person using two identifiers.   I discussed the limitations, risks, security and privacy concerns of performing an evaluation and management service by telephone and the availability of in person appointments. I also discussed with the patient that there may be a patient responsible charge related to this service. The patient expressed understanding and agreed to proceed.  [place], CMA 02/09/2019  10:45 AM

## 2019-02-09 NOTE — Progress Notes (Signed)
   TELEHEALTH VIRTUAL OBSTETRICS VISIT ENCOUNTER NOTE  I connected with Kaitlyn Whitaker on 02/09/19 at 10:55 AM EDT by MyChart Virtual Visit at home and verified that I am speaking with the correct person using two identifiers.   I discussed the limitations, risks, security and privacy concerns of performing an evaluation and management service by telephone and the availability of in person appointments. I also discussed with the patient that there may be a patient responsible charge related to this service. The patient expressed understanding and agreed to proceed.  Subjective:  Kaitlyn Whitaker is a 23 y.o. G4P1021 at [redacted]w[redacted]d being followed for ongoing prenatal care.  She is currently monitored for the following issues for this low-risk pregnancy and has Nausea and vomiting in pregnancy prior to [redacted] weeks gestation; Supervision of low-risk pregnancy; and Late Entry Baby Scripts March 2020-social distancing on their problem list.  Patient reports no complaints. Reports fetal movement. Denies any contractions, bleeding or leaking of fluid.   The following portions of the patient's history were reviewed and updated as appropriate: allergies, current medications, past family history, past medical history, past social history, past surgical history and problem list.   Objective:   General:  Alert, oriented and cooperative.   Mental Status: Normal mood and affect perceived. Normal judgment and thought content.  Rest of physical exam deferred due to type of encounter  Assessment and Plan:  Pregnancy: G4P1021 at [redacted]w[redacted]d 1. Encounter for supervision of normal first pregnancy in third trimester -Initial BP 126/107, recheck 128/98. Denies HA, visual changes or epigastric pain. -Instructed patient to go to MAU for evaluation of new onset HTN. Report called to K. Kooistra CNM. Discussed with patient possible need for IOL  Term labor symptoms and general obstetric precautions including but not limited to  vaginal bleeding, contractions, leaking of fluid and fetal movement were reviewed in detail with the patient.  I discussed the assessment and treatment plan with the patient. The patient was provided an opportunity to ask questions and all were answered. The patient agreed with the plan and demonstrated an understanding of the instructions. The patient was advised to call back or seek an in-person office evaluation/go to MAU at [facility] for any urgent or concerning symptoms. Please refer to After Visit Summary for other counseling recommendations.   I provided 20 minutes of non-face-to-face time during this encounter.  Return in about 1 week (around 02/16/2019) for Return OB visit.  Future Appointments  Date Time Provider [place]  02/16/2019  2:35 PM Erick Colace [facility] [place]  02/22/2019  8:15 AM [facility]OC[facility] NST [facility] WOC  02/22/2019 10:55 AM Aletha Halim, MD [place], [place] for [employer], [place]

## 2019-02-09 NOTE — MAU Note (Signed)
?   Leaking when first got up when heading to bathroom, none since

## 2019-02-09 NOTE — MAU Note (Signed)
Had 38 wk tele appt. First reading  Was 126/107, p 130; 2nd was 128/98, p 128.  Told her to come in for further eval. Denies HA, every once in a while  Will have blurring , denies epigastric pain, some increase in swelling in hands and arms.

## 2019-02-09 NOTE — Discharge Instructions (Signed)
-CHECK BP TOMORROW AT HOME DAILY; ANY ELEVATED VALUES THAT ARE 140/90, COME TO MATERNITY ASSESSMENT  Preeclampsia and Eclampsia  Preeclampsia is a serious condition that may develop during pregnancy. It is also called toxemia of pregnancy. This condition causes high blood pressure along with other symptoms, such as swelling and headaches. These symptoms may develop as the condition gets worse. Preeclampsia may occur at 20 weeks of pregnancy or later. Diagnosing and treating preeclampsia early is very important. If not treated early, it can cause serious problems for you and your baby. One problem it can lead to is eclampsia. Eclampsia is a condition that causes muscle jerking or shaking (convulsions or seizures) and other serious problems for the mother. During pregnancy, delivering your baby may be the best treatment for preeclampsia or eclampsia. For most women, preeclampsia and eclampsia symptoms go away after giving birth. In rare cases, a woman may develop preeclampsia after giving birth (postpartum preeclampsia). This usually occurs within 48 hours after childbirth but may occur up to 6 weeks after giving birth. What are the causes? The cause of preeclampsia is not known. What increases the risk? The following risk factors make you more likely to develop preeclampsia:  Being pregnant for the first time.  Having had preeclampsia during a past pregnancy.  Having a family history of preeclampsia.  Having high blood pressure.  Being pregnant with more than one baby.  Being 22 or older.  Being African-American.  Having kidney disease or diabetes.  Having medical conditions such as lupus or blood diseases.  Being very overweight (obese). What are the signs or symptoms? The earliest signs of preeclampsia are:  High blood pressure.  Increased protein in your urine. Your health care provider will check for this at every visit before you give birth (prenatal visit). Other symptoms  that may develop as the condition gets worse include:  Severe headaches.  Sudden weight gain.  Swelling of the hands, face, legs, and feet.  Nausea and vomiting.  Vision problems, such as blurred or double vision.  Numbness in the face, arms, legs, and feet.  Urinating less than usual.  Dizziness.  Slurred speech.  Abdominal pain, especially upper abdominal pain.  Convulsions or seizures. How is this diagnosed? There are no screening tests for preeclampsia. Your health care provider will ask you about symptoms and check for signs of preeclampsia during your prenatal visits. You may also have tests that include:  Urine tests.  Blood tests.  Checking your blood pressure.  Monitoring your babys heart rate.  Ultrasound. How is this treated? You and your health care provider will determine the treatment approach that is best for you. Treatment may include:  Having more frequent prenatal exams to check for signs of preeclampsia, if you have an increased risk for preeclampsia.  Medicine to lower your blood pressure.  Staying in the hospital, if your condition is severe. There, treatment will focus on controlling your blood pressure and the amount of fluids in your body (fluid retention).  Taking medicine (magnesium sulfate) to prevent seizures. This may be given as an injection or through an IV.  Taking a low-dose aspirin during your pregnancy.  Delivering your baby early, if your condition gets worse. You may have your labor started with medicine (induced), or you may have a cesarean delivery. Follow these instructions at home: Eating and drinking   Drink enough fluid to keep your urine pale yellow.  Avoid caffeine. Lifestyle  Do not use any products that contain nicotine or  tobacco, such as cigarettes and e-cigarettes. If you need help quitting, ask your health care provider.  Do not use alcohol or drugs.  Avoid stress as much as possible. Rest and get plenty  of sleep. General instructions  Take over-the-counter and prescription medicines only as told by your health care provider.  When lying down, lie on your left side. This keeps pressure off your major blood vessels.  When sitting or lying down, raise (elevate) your feet. Try putting some pillows underneath your lower legs.  Exercise regularly. Ask your health care provider what kinds of exercise are best for you.  Keep all follow-up and prenatal visits as told by your health care provider. This is important. How is this prevented? There is no known way of preventing preeclampsia or eclampsia from developing. However, to lower your risk of complications and detect problems early:  Get regular prenatal care. Your health care provider may be able to diagnose and treat the condition early.  Maintain a healthy weight. Ask your health care provider for help managing weight gain during pregnancy.  Work with your health care provider to manage any long-term (chronic) health conditions you have, such as diabetes or kidney problems.  You may have tests of your blood pressure and kidney function after giving birth.  Your health care provider may have you take low-dose aspirin during your next pregnancy. Contact a health care provider if:  You have symptoms that your health care provider told you may require more treatment or monitoring, such as: ? Headaches. ? Nausea or vomiting. ? Abdominal pain. ? Dizziness. ? Light-headedness. Get help right away if:  You have severe: ? Abdominal pain. ? Headaches that do not get better. ? Dizziness. ? Vision problems. ? Confusion. ? Nausea or vomiting.  You have any of the following: ? A seizure. ? Sudden, rapid weight gain. ? Sudden swelling in your hands, ankles, or face. ? Trouble moving any part of your body. ? Numbness in any part of your body. ? Trouble speaking. ? Abnormal bleeding.  You faint. Summary  Preeclampsia is a serious  condition that may develop during pregnancy. It is also called toxemia of pregnancy.  This condition causes high blood pressure along with other symptoms, such as swelling and headaches.  Diagnosing and treating preeclampsia early is very important. If not treated early, it can cause serious problems for you and your baby.  Get help right away if you have symptoms that your health care provider told you to watch for. This information is not intended to replace advice given to you by your health care provider. Make sure you discuss any questions you have with your health care provider. Document Released: 08/16/2000 Document Revised: 08/05/2017 Document Reviewed: 03/25/2016 Elsevier Interactive Patient Education  2019 [employer]

## 2019-02-09 NOTE — MAU Provider Note (Signed)
Patient Kaitlyn Whitaker is a 23 y.o. G4P1021 At 4153w2d here after having an two elevated BPs this morning. She denies LOF, VB, decreased fetal movements. She has had an uncomplicated pregnancy so far. She denies dysuria, abdominal pain, HA, RUQ, blurry vision, or other signs and symptoms of pre-eclampsia.    History     CSN: [id number]  Arrival date and time: 02/09/19 1258   First Provider Initiated Contact with Patient 02/09/19 1335      Chief Complaint  Patient presents with  . Hypertension  . swollen hands   HPI Patient checked her blood pressure 20 minutes before her prenatal visit (on My Chart) video) and it was 126/107 (taken at 10:25 am). Pulse was 130.   She rechecked her blood pressure during her visit with Sharmon RevereNM Neill, and it was 128/98 (10:50 am).  CNM Druscilla Brownieeill recommended that patient come to MAU, MAU provider notified.   Patient's checked her BP again at 12 noon and it was 124/92.   Patient denies HA, RUQ pain, floating spots, blurry vision. She had some blurry vision earlier this morning from 11:30 to 12.    OB History    Gravida  4   Para  1   Term  1   Preterm      AB  2   Living  1     SAB  2   TAB      Ectopic      Multiple  0   Live Births  1           Past Medical History:  Diagnosis Date  . Anemia   . Back pain    Pt states chronic back pain after fall in 2016  . Depression    feeling balanced, no problems  . GERD (gastroesophageal reflux disease)     Past Surgical History:  Procedure Laterality Date  . NASAL SINUS SURGERY    . TONSILLECTOMY      Family History  Problem Relation Age of Onset  . Diabetes Maternal Uncle   . Atrial fibrillation Mother   . Pulmonary embolism Mother   . Anemia Mother   . Hypertension Father   . Hypothyroidism Father     Social History   Tobacco Use  . Smoking status: Former Smoker    Types: Cigarettes    Last attempt to quit: 01/13/2016    Years since quitting: 3.0  . Smokeless  tobacco: Never Used  Substance Use Topics  . Alcohol use: Not Currently    Frequency: Never  . Drug use: Not Currently    Types: Marijuana    Comment: 4 years ago    Allergies: No Known Allergies  Medications Prior to Admission  Medication Sig Dispense Refill Last Dose  . calcium carbonate (TUMS - DOSED IN MG ELEMENTAL CALCIUM) 500 MG chewable tablet Chew 1 tablet by mouth daily.   Taking  . diphenhydrAMINE (BENADRYL) 25 MG tablet Take 25 mg by mouth at bedtime as needed.   Taking  . docusate sodium (COLACE) 100 MG capsule Take 1 capsule (100 mg total) by mouth 2 (two) times daily as needed for mild constipation or moderate constipation (The iron supplement may cause constipation.). 30 capsule 1 Taking  . ferrous sulfate 325 (65 FE) MG tablet Take 1 tablet (325 mg total) by mouth daily with breakfast. 30 tablet 3 Taking  . Prenatal Vit-Fe Fumarate-FA (PRENATAL VITAMIN) 27-0.8 MG TABS Take 1 tablet by mouth daily.   Taking    Review  of Systems  Constitutional: Negative.   HENT: Negative.   Respiratory: Negative.   Cardiovascular: Negative.   Gastrointestinal: Negative.   Neurological: Negative.    Physical Exam   Blood pressure 120/78, pulse (!) 103, temperature 98.6 F (37 C), temperature source Oral, resp. rate 16, height 5' 4.5" (1.638 m), weight 63.5 kg, last menstrual period 05/17/2018, SpO2 99 %, unknown if currently breastfeeding.  Physical Exam  Constitutional: She is oriented to person, place, and time. She appears well-developed.  HENT:  Head: Normocephalic.  Neck: Normal range of motion.  Respiratory: Effort normal.  GI: Soft. Bowel sounds are normal.  Musculoskeletal: Normal range of motion.  Neurological: She is alert and oriented to person, place, and time.  Skin: Skin is warm and dry.    MAU Course  Procedures -NST: 140 bpm, mod var, present acels, negative decels, no contractions. Patient stated that between 2 and 3 she started to have back pain and was  constantly readjusting herself in bed and that she noticed that whenever she moved the baby's heartbeat would drop as she moved around or her belly would lose contact with the monitor. She reports strong fetal movements while in MAU.   -cervical exam deferred per patient request  CBC: normal CMP: normal PCR: pending at time of discharge; resulted 0.17 at 1556.   MDM Patient Vitals for the past 24 hrs:  BP Temp Temp src Pulse Resp SpO2 Height Weight  02/09/19 1515 120/78 - - (!) 103 - - - -  02/09/19 1514 - - - - - 99 % - -  02/09/19 1509 - - - - - 98 % - -  02/09/19 1500 116/85 - - (!) 106 - - - -  02/09/19 1445 122/79 - - 98 - - - -  02/09/19 1430 126/85 - - (!) 108 - - - -  02/09/19 1415 107/89 - - (!) 114 - - - -  02/09/19 1409 - - - - - 98 % - -  02/09/19 1404 - - - - - 98 % - -  02/09/19 1400 (!) 130/94 - - (!) 111 - - - -  02/09/19 1359 - - - - - 98 % - -  02/09/19 1354 - - - - - 98 % - -  02/09/19 1349 - - - - - 98 % - -  02/09/19 1345 (!) 134/95 - - (!) 113 - - - -  02/09/19 1344 - - - - - 99 % - -  02/09/19 1339 - - - - - 98 % - -  02/09/19 1330 (!) 130/92 - - (!) 129 - - - -  02/09/19 1329 (!) 141/86 - - (!) 125 - 98 % - -  02/09/19 1312 134/86 98.6 F (37 C) Oral (!) 124 16 100 % 5' 4.5" (1.638 m) 63.5 kg     Assessment and Plan   1. Transient hypertension of pregnancy in third trimester    2. Discussed with Dr. Macon LargeAnyanwu that patient has had normal BP for over an hour and was asymptomatic. Reported that patient's NST was reassuring, and that patient is reliable about taking her BP at home.  Dr. Macon LargeAnyanwu agrees that patient is safe for discharge with daily BP checks and strict return precautions. Message sent to clinic with high importance for patient to have phone call tomorrow for virtual BP check.   3. Patient agrees with plan; due to COVID-19 concerns, will take her BP daily at home as well as have  virtual BP visit tomorrow. Reviewed warning signs and when to  return to MAU; knows to return to MAU if she has pressures 140/90, or any symptoms of pre-e. Patient is reliable and committed to monitoring her BP at home.   4. Patient to keep follow up appt next week and wait for [facility] staff to call her tomorrow for BP check.    Mervyn Skeeters Nayelli Inglis 02/09/2019, 3:46 PM

## 2019-02-10 ENCOUNTER — Inpatient Hospital Stay (HOSPITAL_COMMUNITY): Payer: Medicaid Other | Admitting: Anesthesiology

## 2019-02-10 ENCOUNTER — Other Ambulatory Visit: Payer: Self-pay

## 2019-02-10 ENCOUNTER — Telehealth: Payer: Medicaid Other

## 2019-02-10 ENCOUNTER — Encounter (HOSPITAL_COMMUNITY): Payer: Self-pay | Admitting: *Deleted

## 2019-02-10 ENCOUNTER — Inpatient Hospital Stay (HOSPITAL_COMMUNITY)
Admission: AD | Admit: 2019-02-10 | Discharge: 2019-02-12 | DRG: 807 | Disposition: A | Discharge status: Home / Self Care | Payer: Medicaid Other | Attending: Obstetrics and Gynecology | Admitting: Obstetrics and Gynecology

## 2019-02-10 DIAGNOSIS — O134 Gestational [pregnancy-induced] hypertension without significant proteinuria, complicating childbirth: Principal | ICD-10-CM | POA: Diagnosis present

## 2019-02-10 DIAGNOSIS — O139 Gestational [pregnancy-induced] hypertension without significant proteinuria, unspecified trimester: Secondary | ICD-10-CM | POA: Diagnosis present

## 2019-02-10 DIAGNOSIS — Z3492 Encounter for supervision of normal pregnancy, unspecified, second trimester: Secondary | ICD-10-CM

## 2019-02-10 DIAGNOSIS — Z3A38 38 weeks gestation of pregnancy: Secondary | ICD-10-CM

## 2019-02-10 DIAGNOSIS — O133 Gestational [pregnancy-induced] hypertension without significant proteinuria, third trimester: Secondary | ICD-10-CM | POA: Diagnosis not present

## 2019-02-10 DIAGNOSIS — Z1159 Encounter for screening for other viral diseases: Secondary | ICD-10-CM

## 2019-02-10 DIAGNOSIS — O1493 Unspecified pre-eclampsia, third trimester: Secondary | ICD-10-CM

## 2019-02-10 DIAGNOSIS — Z87891 Personal history of nicotine dependence: Secondary | ICD-10-CM

## 2019-02-10 DIAGNOSIS — Z3480 Encounter for supervision of other normal pregnancy, unspecified trimester: Secondary | ICD-10-CM

## 2019-02-10 DIAGNOSIS — Z832 Family history of diseases of the blood and blood-forming organs and certain disorders involving the immune mechanism: Secondary | ICD-10-CM

## 2019-02-10 LAB — COMPREHENSIVE METABOLIC PANEL
ALT: 11 U/L (ref 0–44)
AST: 25 U/L (ref 15–41)
Albumin: 2.7 g/dL — ABNORMAL LOW (ref 3.5–5.0)
Alkaline Phosphatase: 320 U/L — ABNORMAL HIGH (ref 38–126)
Anion gap: 14 (ref 5–15)
BUN: 9 mg/dL (ref 6–20)
CO2: 19 mmol/L — ABNORMAL LOW (ref 22–32)
Calcium: 9.6 mg/dL (ref 8.9–10.3)
Chloride: 104 mmol/L (ref 98–111)
Creatinine, Ser: 0.62 mg/dL (ref 0.44–1.00)
GFR calc Af Amer: >60 mL/min (ref >60)
GFR calc non Af Amer: >60 mL/min (ref >60)
Glucose, Bld: 94 mg/dL (ref 70–99)
Potassium: 4.2 mmol/L (ref 3.5–5.1)
Sodium: 137 mmol/L (ref 135–145)
Total Bilirubin: 0.5 mg/dL (ref 0.3–1.2)
Total Protein: 6.3 g/dL — ABNORMAL LOW (ref 6.5–8.1)

## 2019-02-10 LAB — CBC
HCT: 36.8 % (ref 36.0–46.0)
HCT: 30.9 % — ABNORMAL LOW (ref 36.0–46.0)
Hemoglobin: 12.8 g/dL (ref 12.0–15.0)
Hemoglobin: 10.7 g/dL — ABNORMAL LOW (ref 12.0–15.0)
MCH: 30.2 pg (ref 26.0–34.0)
MCH: 30.1 pg (ref 26.0–34.0)
MCHC: 34.8 g/dL (ref 30.0–36.0)
MCHC: 34.6 g/dL (ref 30.0–36.0)
MCV: 86.8 fL (ref 80.0–100.0)
MCV: 87 fL (ref 80.0–100.0)
Platelets: 274 10*3/uL (ref 150–400)
Platelets: 212 10*3/uL (ref 150–400)
RBC: 4.24 MIL/uL (ref 3.87–5.11)
RBC: 3.55 MIL/uL — ABNORMAL LOW (ref 3.87–5.11)
RDW: 13.7 % (ref 11.5–15.5)
RDW: 13.8 % (ref 11.5–15.5)
WBC: 10.5 10*3/uL (ref 4.0–10.5)
WBC: 10.4 10*3/uL (ref 4.0–10.5)
nRBC: 0 % (ref 0.0–0.2)
nRBC: 0 % (ref 0.0–0.2)

## 2019-02-10 LAB — PROTEIN / CREATININE RATIO, URINE
Creatinine, Urine: 137.56 mg/dL
Protein Creatinine Ratio: 0.14 mg/mg{Cre} (ref 0.00–0.15)
Total Protein, Urine: 19 mg/dL

## 2019-02-10 LAB — SARS CORONAVIRUS 2: SARS Coronavirus 2: NOT DETECTED

## 2019-02-10 LAB — TYPE AND SCREEN
ABO/RH(D): B POS
Antibody Screen: NEGATIVE

## 2019-02-10 LAB — ABO/RH: ABO/RH(D): B POS

## 2019-02-10 LAB — RPR: RPR Ser Ql: NONREACTIVE

## 2019-02-10 MED ORDER — MISOPROSTOL 25 MCG QUARTER TABLET
25.0000 ug | ORAL_TABLET | ORAL | Status: DC | PRN
  Administered 2019-02-10: 25 ug via VAGINAL
  Filled 2019-02-10: qty 1

## 2019-02-10 MED ORDER — DIPHENHYDRAMINE HCL 50 MG/ML IJ SOLN: 12.5000 mg | INTRAMUSCULAR | Status: DC | PRN

## 2019-02-10 MED ORDER — LIDOCAINE HCL (PF) 1 % IJ SOLN: 30.0000 mL | INTRAMUSCULAR | Status: DC | PRN

## 2019-02-10 MED ORDER — EPHEDRINE 5 MG/ML INJ: 10.0000 mg | INTRAVENOUS | Status: DC | PRN

## 2019-02-10 MED ORDER — IBUPROFEN 600 MG PO TABS
600.0000 mg | ORAL_TABLET | Freq: Four times a day (QID) | ORAL | Status: DC
  Administered 2019-02-11 – 2019-02-12 (×5): 600 mg via ORAL
  Filled 2019-02-10 (×6): qty 1

## 2019-02-10 MED ORDER — PHENYLEPHRINE 40 MCG/ML (10ML) SYRINGE FOR IV PUSH (FOR BLOOD PRESSURE SUPPORT): 80.0000 ug | PREFILLED_SYRINGE | INTRAVENOUS | Status: DC | PRN

## 2019-02-10 MED ORDER — OXYTOCIN 40 UNITS IN NORMAL SALINE INFUSION - SIMPLE MED
1.0000 m[IU]/min | INTRAVENOUS | Status: DC
  Administered 2019-02-10: 2 m[IU]/min via INTRAVENOUS
  Filled 2019-02-10: qty 1000

## 2019-02-10 MED ORDER — FENTANYL-BUPIVACAINE-NACL 0.5-0.125-0.9 MG/250ML-% EP SOLN
12.0000 mL/h | EPIDURAL | Status: DC | PRN
  Filled 2019-02-10: qty 250

## 2019-02-10 MED ORDER — SOD CITRATE-CITRIC ACID 500-334 MG/5ML PO SOLN
30.0000 mL | ORAL | Status: DC | PRN
  Administered 2019-02-10: 30 mL via ORAL
  Filled 2019-02-10: qty 30

## 2019-02-10 MED ORDER — WITCH HAZEL-GLYCERIN EX PADS: 1.0000 "application " | MEDICATED_PAD | CUTANEOUS | Status: DC | PRN

## 2019-02-10 MED ORDER — TETANUS-DIPHTH-ACELL PERTUSSIS 5-2.5-18.5 LF-MCG/0.5 IM SUSP: 0.5000 mL | Freq: Once | INTRAMUSCULAR | Status: DC

## 2019-02-10 MED ORDER — COCONUT OIL OIL: 1.0000 "application " | TOPICAL_OIL | Status: DC | PRN

## 2019-02-10 MED ORDER — SIMETHICONE 80 MG PO CHEW: 80.0000 mg | CHEWABLE_TABLET | ORAL | Status: DC | PRN

## 2019-02-10 MED ORDER — TERBUTALINE SULFATE 1 MG/ML IJ SOLN: 0.2500 mg | Freq: Once | INTRAMUSCULAR | Status: DC | PRN

## 2019-02-10 MED ORDER — DIPHENHYDRAMINE HCL 25 MG PO CAPS: 25.0000 mg | ORAL_CAPSULE | Freq: Four times a day (QID) | ORAL | Status: DC | PRN

## 2019-02-10 MED ORDER — ACETAMINOPHEN 325 MG PO TABS
650.0000 mg | ORAL_TABLET | ORAL | Status: DC | PRN
  Administered 2019-02-11: 650 mg via ORAL
  Filled 2019-02-10: qty 2

## 2019-02-10 MED ORDER — LACTATED RINGERS IV SOLN
INTRAVENOUS | Status: DC
  Administered 2019-02-10 (×3): via INTRAVENOUS

## 2019-02-10 MED ORDER — ONDANSETRON HCL 4 MG PO TABS: 4.0000 mg | ORAL_TABLET | ORAL | Status: DC | PRN

## 2019-02-10 MED ORDER — SENNOSIDES-DOCUSATE SODIUM 8.6-50 MG PO TABS
2.0000 | ORAL_TABLET | ORAL | Status: DC
  Administered 2019-02-11 (×2): 2 via ORAL
  Filled 2019-02-10 (×2): qty 2

## 2019-02-10 MED ORDER — PROMETHAZINE HCL 25 MG/ML IJ SOLN
12.5000 mg | Freq: Once | INTRAMUSCULAR | Status: AC
  Administered 2019-02-10: 12.5 mg via INTRAVENOUS
  Filled 2019-02-10: qty 1

## 2019-02-10 MED ORDER — ONDANSETRON HCL 4 MG/2ML IJ SOLN: 4.0000 mg | INTRAMUSCULAR | Status: DC | PRN

## 2019-02-10 MED ORDER — ONDANSETRON HCL 4 MG/2ML IJ SOLN
4.0000 mg | Freq: Four times a day (QID) | INTRAMUSCULAR | Status: DC | PRN
  Administered 2019-02-10 (×2): 4 mg via INTRAVENOUS
  Filled 2019-02-10 (×2): qty 2

## 2019-02-10 MED ORDER — FLEET ENEMA 7-19 GM/118ML RE ENEM: 1.0000 | ENEMA | RECTAL | Status: DC | PRN

## 2019-02-10 MED ORDER — ZOLPIDEM TARTRATE 5 MG PO TABS: 5.0000 mg | ORAL_TABLET | Freq: Every evening | ORAL | Status: DC | PRN

## 2019-02-10 MED ORDER — LIDOCAINE HCL (PF) 1 % IJ SOLN
INTRAMUSCULAR | Status: DC | PRN
  Administered 2019-02-10 (×2): 5 mL via EPIDURAL

## 2019-02-10 MED ORDER — OXYTOCIN 40 UNITS IN NORMAL SALINE INFUSION - SIMPLE MED: 2.5000 [IU]/h | INTRAVENOUS | Status: DC

## 2019-02-10 MED ORDER — MEASLES, MUMPS & RUBELLA VAC IJ SOLR: 0.5000 mL | Freq: Once | INTRAMUSCULAR | Status: DC

## 2019-02-10 MED ORDER — SODIUM CHLORIDE (PF) 0.9 % IJ SOLN
INTRAMUSCULAR | Status: DC | PRN
  Administered 2019-02-10: 14 mL/h via EPIDURAL

## 2019-02-10 MED ORDER — PRENATAL MULTIVITAMIN CH
1.0000 | ORAL_TABLET | Freq: Every day | ORAL | Status: DC
  Administered 2019-02-11: 11:00:00 1 via ORAL
  Filled 2019-02-10: qty 1

## 2019-02-10 MED ORDER — LACTATED RINGERS IV SOLN: 500.0000 mL | Freq: Once | INTRAVENOUS | Status: DC

## 2019-02-10 MED ORDER — FENTANYL CITRATE (PF) 100 MCG/2ML IJ SOLN
100.0000 ug | INTRAMUSCULAR | Status: DC | PRN
  Administered 2019-02-10 (×6): 100 ug via INTRAVENOUS
  Filled 2019-02-10 (×6): qty 2

## 2019-02-10 MED ORDER — BENZOCAINE-MENTHOL 20-0.5 % EX AERO: 1.0000 "application " | INHALATION_SPRAY | CUTANEOUS | Status: DC | PRN

## 2019-02-10 MED ORDER — OXYTOCIN BOLUS FROM INFUSION
500.0000 mL | Freq: Once | INTRAVENOUS | Status: AC
  Administered 2019-02-10: 500 mL via INTRAVENOUS

## 2019-02-10 MED ORDER — LACTATED RINGERS IV SOLN
500.0000 mL | INTRAVENOUS | Status: DC | PRN
  Administered 2019-02-10: 500 mL via INTRAVENOUS

## 2019-02-10 MED ORDER — OXYCODONE HCL 5 MG PO TABS
10.0000 mg | ORAL_TABLET | Freq: Once | ORAL | Status: AC
  Administered 2019-02-10: 10 mg via ORAL
  Filled 2019-02-10: qty 2

## 2019-02-10 MED ORDER — DIBUCAINE (PERIANAL) 1 % EX OINT: 1.0000 "application " | TOPICAL_OINTMENT | CUTANEOUS | Status: DC | PRN

## 2019-02-10 NOTE — Anesthesia Procedure Notes (Signed)
Epidural Patient location during procedure: OB Start time: 02/10/2019 4:23 PM End time: 02/10/2019 4:33 PM  Staffing Anesthesiologist: Murvin Natal, MD Performed: anesthesiologist   Preanesthetic Checklist Completed: patient identified, site marked, pre-op evaluation, timeout performed, IV checked, risks and benefits discussed and monitors and equipment checked  Epidural Patient position: sitting Prep: DuraPrep Patient monitoring: heart rate, cardiac monitor, continuous pulse ox and blood pressure Approach: midline Location: L4-L5 Injection technique: LOR air  Needle:  Needle type: Tuohy  Needle gauge: 17 G Needle length: 9 cm Needle insertion depth: 6 cm Catheter type: closed end flexible Catheter size: 19 Gauge Catheter at skin depth: 11 cm Test dose: negative and Other  Assessment Events: blood not aspirated, injection not painful, no injection resistance and negative IV test  Additional Notes Informed consent obtained prior to proceeding including risk of failure, 1% risk of PDPH, risk of minor discomfort and bruising. Discussed alternatives to epidural analgesia and patient desires to proceed.  Timeout performed pre-procedure verifying patient name, procedure, and platelet count.  Patient tolerated procedure well. Reason for block:procedure for pain

## 2019-02-10 NOTE — Plan of Care (Signed)
Provided report to Carleene Overlie, RN  Delorise Shiner, RN

## 2019-02-10 NOTE — H&P (Addendum)
OBSTETRIC ADMISSION HISTORY AND PHYSICAL  Subective Kaitlyn Whitaker is a [age] y.o. female [phone] with IUP at 5186w3d by L/15 presenting for IOL for new onset gHTN. Seen in MAU yesterday with elevated blood pressures and now with headache that did not improve with Tylenol. Improved with oxycodone. Reports fetal movement. Denies vaginal bleeding and ROM.  [facility] Support person in labor: Harriett Sineancy (mother)  Ultrasounds . 19 w 1 : Anatomy scan -within normal limits, some suboptimal views of fetal anatomy . 23w3 : normal interval growth with suboptimal views on spine  . 17w4 : complete anatomy scan. Wnl.  . 36w5 : EFW 3276g, 7lb, 4oz 83%ile  Prenatal History/Complications: . Anxiety/Depression: weaned off of lexapro and cymbalta in 3rd trimester . Family history of genetic hypercoagulable disorder (mom and maternal GM)  Past Medical History: Past Medical History:  Diagnosis Date  . Anemia   . Back pain    Pt states chronic back pain after fall in 2016  . Depression    feeling balanced, no problems  . GERD (gastroesophageal reflux disease)    Past Surgical History: Past Surgical History:  Procedure Laterality Date  . NASAL SINUS SURGERY    . TONSILLECTOMY     Obstetrical History: OB History    Gravida  4   Para  1   Term  1   Preterm      AB  2   Living  1     SAB  2   TAB      Ectopic      Multiple  0   Live Births  1          Social History: Social History   Socioeconomic History  . Marital status: Single    Spouse name: Not on file  . Number of children: 1  . Years of education: 2812  . Highest education level: 12th grade  Occupational History  . Not on file  Social Needs  . Financial resource strain: Not on file  . Food insecurity:    Worry: Sometimes true    Inability: Sometimes true  . Transportation needs:    Medical: No    Non-medical: No  Tobacco Use  . Smoking status: Former Smoker    Types: Cigarettes     Last attempt to quit: 01/13/2016    Years since quitting: 3.0  . Smokeless tobacco: Never Used  Substance and Sexual Activity  . Alcohol use: Not Currently    Frequency: Never  . Drug use: Not Currently    Types: Marijuana    Comment: 4 years ago  . Sexual activity: Yes    Birth control/protection: None  Lifestyle  . Physical activity:    Days per week: Not on file    Minutes per session: Not on file  . Stress: Not on file  Relationships  . Social connections:    Talks on phone: Not on file    Gets together: Not on file    Attends religious service: Not on file    Active member of club or organization: Not on file    Attends meetings of clubs or organizations: Not on file    Relationship status: Not on file  Other Topics Concern  . Not on file  Social History Narrative  . Not on file   Family History: Family History  Problem Relation Age of Onset  . Diabetes Maternal Uncle   . Atrial fibrillation Mother   . Pulmonary embolism  Mother   . Anemia Mother   . Hypertension Father   . Hypothyroidism Father    Allergies: No Known Allergies Medications:  Current Meds  Medication Sig  . calcium carbonate (TUMS - DOSED IN MG ELEMENTAL CALCIUM) 500 MG chewable tablet Chew 1 tablet by mouth daily.  . diphenhydrAMINE (BENADRYL) 25 MG tablet Take 25 mg by mouth at bedtime as needed.  . ferrous sulfate 325 (65 FE) MG tablet Take 1 tablet (325 mg total) by mouth daily with breakfast.  . Prenatal Vit-Fe Fumarate-FA (PRENATAL VITAMIN) 27-0.8 MG TABS Take 1 tablet by mouth daily.    Review of Systems  All systems reviewed and negative except as stated in HPI  Objective Physical Exam:  Blood pressure 136/78, pulse 89, temperature 97.7 F (36.5 C), temperature source Oral, height 5' 4.5" (1.638 m), weight 64.1 kg, last menstrual period 05/17/2018, SpO2 99 %, unknown if currently breastfeeding. General appearance: alert, cooperative, appears stated age and no distress Lungs: no  respiratory distress Heart: RRR. No BLEE.  Abdomen: soft, non-tender; gravid  Pelvic: deferred Extremities: Moving spontaneously, warm, well perfused. 2+ DP. Uterine activity: irritable  Cervical Exam:  Dilation: 1.5 Effacement (%): 50 Station: -3 Exam by:: Bari Mantis Lytle RN  Presentation: cephalic Fetal monitoring: baseline 140 / mod variability/ +a / +d  Prenatal labs: ABO, Rh: B/Positive/-- (12/17 1640) Antibody: Negative (12/17 1640) Rubella: 3.44 (12/17 1640) RPR: Non Reactive (03/23 0849)  HBsAg: Negative (12/17 1640)  HIV: Non Reactive (03/23 0849)  GBS: negative (01/26/19) Glucola: 3/23 2 hour: 97/70 Genetic screening:  NIPs low risk, F. AFP screen negative.   Prenatal Transfer Tool  Maternal Diabetes: No Genetic Screening: Normal Maternal Ultrasounds/Referrals: Normal Fetal Ultrasounds or other Referrals:  None Maternal Substance Abuse:  No Significant Maternal Medications:  None Significant Maternal Lab Results: None  Results for orders placed or performed during the hospital encounter of 02/09/19 (from the past 24 hour(s))  CBC   Collection Time: 02/09/19  1:34 PM  Result Value Ref Range   WBC 9.4 4.0 - 10.5 K/uL   RBC 4.08 3.87 - 5.11 MIL/uL   Hemoglobin 12.2 12.0 - 15.0 g/dL   HCT 13.035.3 (L) 86.536.0 - 78.446.0 %   MCV 86.5 80.0 - 100.0 fL   MCH 29.9 26.0 - 34.0 pg   MCHC 34.6 30.0 - 36.0 g/dL   RDW 69.613.8 29.511.5 - 28.415.5 %   Platelets 264 150 - 400 K/uL   nRBC 0.0 0.0 - 0.2 %  Comprehensive metabolic panel   Collection Time: 02/09/19  1:34 PM  Result Value Ref Range   Sodium 135 135 - 145 mmol/L   Potassium 3.9 3.5 - 5.1 mmol/L   Chloride 106 98 - 111 mmol/L   CO2 18 (L) 22 - 32 mmol/L   Glucose, Bld 116 (H) 70 - 99 mg/dL   BUN 8 6 - 20 mg/dL   Creatinine, Ser 1.320.72 0.44 - 1.00 mg/dL   Calcium 9.1 8.9 - 44.010.3 mg/dL   Total Protein 6.4 (L) 6.5 - 8.1 g/dL   Albumin 2.7 (L) 3.5 - 5.0 g/dL   AST 26 15 - 41 U/L   ALT 13 0 - 44 U/L   Alkaline Phosphatase 309 (H) 38 - 126  U/L   Total Bilirubin 0.4 0.3 - 1.2 mg/dL   GFR calc non Af Amer >60 >60 mL/min   GFR calc Af Amer >60 >60 mL/min   Anion gap 11 5 - 15  Protein / creatinine ratio, urine  Collection Time: 02/09/19  2:36 PM  Result Value Ref Range   Creatinine, Urine 125.98 mg/dL   Total Protein, Urine 22 mg/dL   Protein Creatinine Ratio 0.17 (H) 0.00 - 0.15 mg/mg[Cre]    Patient Active Problem List   Diagnosis Date Noted  . Gestational hypertension 02/10/2019  . Late Entry Baby Scripts March 2020-social distancing 11/23/2018  . Supervision of low-risk pregnancy 08/18/2018  . Nausea and vomiting in pregnancy prior to [redacted] weeks gestation 07/06/2018    Assessment & Plan:  Donnalee Cellucci is a 23 y.o. S0F0932 at [redacted]w[redacted]d here for IOL for new onset gHTN  Labor: Contractions irregular. Augmentation with cytotec. Last CE 1.5/50/-3 and vertex per RN exam. . Pain control: IV fentanyl q1hr PRN  . SCDs given family hx of hypercoagulable d/o. No hx of DVT/clots.   GHTN: Elevated BP values since yesterday. Patient also experiencing blurry vision oxycodone x 1 for headache. Pre-E labs s/f slightly elevated UPC.  Platelets within normal limits, no RUQ pain or evidence of transaminitis.  Creatinine is 0.72 and there is no evidence of crackles on exam.  Patient does report headache which improved with oxycodone.  Monitor Bps (goal<140/90)  Monitor symptoms  Fetal Wellbeing: EFW 3276g at 36w5 u/s. Cephalic by RN exam. Baseline 140, mod variability, + a, -d . GBS negative . Category I tracing, continuous fetal monitoring  Postpartum Planning . girl/ Breast / Contraception: minipill  . [x] Tdap   Zettie Cooley, M.D.  Family Medicine  PGY-1 02/10/2019 5:09 AM  Attestation: I agree with the resident's documentation, and we have discussed the plan of care. Category I tracing. If headache returns, would consider sign of severe preE and start Mg++. Will place FB when able.   Lambert Mody. Juleen China, DO OB/GYN  Fellow

## 2019-02-10 NOTE — Progress Notes (Signed)
OB/GYN Faculty Practice: Labor Progress Note  Subjective:  Doing well. Pain well controlled. Headache improved.   Objective:  BP 124/82 (BP Location: Left Arm)   Pulse (!) 124   Temp 98.1 F (36.7 C) (Oral)   Resp 18   Ht 5' 4.5" (1.638 m)   Wt 64.1 kg   LMP 05/17/2018 (Approximate)   SpO2 99%   BMI 23.88 kg/m  Gen: Lying in bed comfortably. NAD. Extremities: SCDs in place   CE: Dilation: 1.5 Effacement (%): Thick Cervical Position: Posterior Station: -2 Presentation: Vertex Exam by:: Cassie Freer, RN  Assessment and Plan:  Kaitlyn Whitaker is a 23 y.o. [id number] at [redacted]w[redacted]d IOL of new onset gHTN.  Labor: cytotec x1 and FB placed at 0900. CE at that time was 1.5/thick/-2 and posterior. -- pain control: IV fentanyl  -- PPH Risk: Low  Fetal Well-Being: EFW 7-8lb per U/S. Cephalic by ultrasound. Baseline 145, mod variability, +a, -d -- Category I tracing - Continuous fetal monitoring  -- GBS negative  Zettie Cooley, M.D.  Family Medicine  PGY-1 02/10/2019 9:05 AM

## 2019-02-10 NOTE — MAU Note (Signed)
PT SAYS SHE WAS HERE TODAY-  FOR HIGH- BP WENT T DOWN - SENT HOME.   AT 10PM-  STARTED H/A -  TOOK  TYLENOL AT 1030 AND 0245-  NO RELIEF- CHECKED BP AT HOME- 138/91- AT 0109.    AT 0120- 128/93.    CONTINUED TO CHECK-  BP WENT  DOWN.  BUT WHEN UP - BP INCREASES. Marland Kitchen

## 2019-02-10 NOTE — Progress Notes (Signed)
LABOR PROGRESS NOTE  Kaitlyn Whitaker is a 23 y.o. [id number] at [redacted]w[redacted]d  admitted for IOL for gHTN.   Subjective: Strip note. Discussed plan of care with RN. Headache has resolved.   Objective: BP 116/69 (BP Location: Left Arm)   Pulse 97   Temp 97.8 F (36.6 C) (Oral)   Resp 18   Ht 5' 4.5" (1.638 m)   Wt 64.1 kg   LMP 05/17/2018 (Approximate)   SpO2 99%   BMI 23.88 kg/m  or  Vitals:   02/10/19 0749 02/10/19 0841 02/10/19 1038 02/10/19 1248  BP: 124/82 (!) 126/94 117/65 116/69  Pulse: (!) 124 (!) 116 91 97  Resp: 18 17 16 18   Temp:   97.8 F (36.6 C)   TempSrc:   Oral   SpO2:      Weight:      Height:        Dilation: 5.5 Effacement (%): 50 Cervical Position: Middle Station: -1 Presentation: Vertex Exam by:: Carleene Overlie RNC  FHT: baseline rate 130, moderate varibility, +acel, no decel Toco: q2-6   Labs: Lab Results  Component Value Date   WBC 10.5 02/10/2019   HGB 12.8 02/10/2019   HCT 36.8 02/10/2019   MCV 86.8 02/10/2019   PLT 274 02/10/2019    Patient Active Problem List   Diagnosis Date Noted  . Gestational hypertension 02/10/2019  . Family history of hypercoagulability 02/10/2019  . Late Entry Baby Scripts March 2020-social distancing 11/23/2018  . Supervision of low-risk pregnancy 08/18/2018  . Nausea and vomiting in pregnancy prior to [redacted] weeks gestation 07/06/2018    Assessment / Plan: 23 y.o. G4P1021 at [redacted]w[redacted]d here for IOL for gHTN. BPs normotensive. Patient asymptomatic.   Labor: Patient s/p cytotec and FB. Will start Pit 2x2.  Fetal Wellbeing:  Cat I  Pain Control:  Plans for epidural, IV fentanyl prn prior to epidural placement  Anticipated MOD:  NSVD   Phill Myron, D.O. OB Fellow  02/10/2019, 1:29 PM

## 2019-02-10 NOTE — Discharge Summary (Addendum)
OB Discharge Summary     Patient Name: Kaitlyn Whitaker DOB: [birth year] MRN: [id number]  Date of admission: 02/10/2019 Delivering MD: Nicolette Bang   Date of discharge: 02/12/2019  Admitting diagnosis: 54WKS HEADACHE, HBP Intrauterine pregnancy: [redacted]w[redacted]d     Secondary diagnosis:  Principal Problem:   Gestational hypertension Active Problems:   Family history of hypercoagulability   SVD (spontaneous vaginal delivery)  Additional problems: None     Discharge diagnosis: Term Pregnancy Delivered and Gestational Hypertension                                                                                                Post partum procedures:none  Augmentation: AROM, Pitocin and Foley Balloon  Complications: None  Hospital course:  Induction of Labor With Vaginal Delivery   23 y.o. yo [id number] at [redacted]w[redacted]d was admitted to the hospital 02/10/2019 for induction of labor.  Indication for induction: Gestational hypertension.  Patient had an uncomplicated labor course as follows: Membrane Rupture Time/Date: 5:37 PM ,02/10/2019   Intrapartum Procedures: Episiotomy: None [1]                                         Lacerations:  None [1]  Patient had delivery of a Viable infant.  Information for the patient's newborn:  Shellby, Schlink [[id number]]  Delivery Method: Vaginal, Spontaneous(Filed from Delivery Summary)    02/10/2019  Details of delivery can be found in separate delivery note.  Patient had a routine postpartum course. Patient is discharged home 02/12/19.  Physical exam  Vitals:   02/11/19 0550 02/11/19 0821 02/11/19 1530 02/11/19 2140  BP: 102/65 107/72 109/65 111/75  Pulse: 64 70 68 78  Resp: 18 17 16 18   Temp: 97.9 F (36.6 C) 97.8 F (36.6 C) 98.4 F (36.9 C) 98 F (36.7 C)  TempSrc: Oral Oral Oral Oral  SpO2: 100%     Weight:      Height:       General: alert, cooperative and no distress Lochia: appropriate Uterine Fundus: firm Incision: N/A DVT  Evaluation: No evidence of DVT seen on physical exam. Labs: Lab Results  Component Value Date   WBC 10.4 02/10/2019   HGB 10.7 (L) 02/10/2019   HCT 30.9 (L) 02/10/2019   MCV 87.0 02/10/2019   PLT 212 02/10/2019   CMP Latest Ref Rng & Units 02/10/2019  Glucose 70 - 99 mg/dL 94  BUN 6 - 20 mg/dL 9  Creatinine 0.44 - 1.00 mg/dL 0.62  Sodium 135 - 145 mmol/L 137  Potassium 3.5 - 5.1 mmol/L 4.2  Chloride 98 - 111 mmol/L 104  CO2 22 - 32 mmol/L 19(L)  Calcium 8.9 - 10.3 mg/dL 9.6  Total Protein 6.5 - 8.1 g/dL 6.3(L)  Total Bilirubin 0.3 - 1.2 mg/dL 0.5  Alkaline Phos 38 - 126 U/L 320(H)  AST 15 - 41 U/L 25  ALT 0 - 44 U/L 11    Discharge instruction: per After Visit Summary and "Baby and Me Booklet".  After visit meds:  Allergies as of 02/12/2019   No Known Allergies     Medication List    TAKE these medications   calcium carbonate 500 MG chewable tablet Commonly known as: TUMS - dosed in mg elemental calcium Chew 1 tablet by mouth daily.   diphenhydrAMINE 25 MG tablet Commonly known as: BENADRYL Take 25 mg by mouth at bedtime as needed.   docusate sodium 100 MG capsule Commonly known as: Colace Take 1 capsule (100 mg total) by mouth 2 (two) times daily as needed for mild constipation or moderate constipation (The iron supplement may cause constipation.).   ferrous sulfate 325 (65 FE) MG tablet Take 1 tablet (325 mg total) by mouth daily with breakfast.   ibuprofen 600 MG tablet Commonly known as: ADVIL Take 1 tablet (600 mg total) by mouth every 6 (six) hours.   norethindrone 0.35 MG tablet Commonly known as: MICRONOR Start on the Sunday after baby turns 403 weeks old   Prenatal Vitamin 27-0.8 MG Tabs Take 1 tablet by mouth daily.       Diet: low salt diet  Activity: Advance as tolerated. Pelvic rest for 6 weeks.   Outpatient follow up:4 weeks Follow up Appt: Future Appointments  Date Time Provider Department Center  02/18/2019 10:20 AM [facility] NURSE  [facility]  03/10/2019  9:15 AM Burleson, Brand Maleserri L, NP [facility]   Follow up Visit:No follow-ups on file.  Postpartum contraception: Progesterone only pills  Newborn Data: Live born female  Birth Weight:   APGAR: 6, 8  Newborn Delivery   Birth date/time:  02/10/2019 18:53:00 Delivery type:  Vaginal, Spontaneous     Baby Feeding: Breast Disposition: Home   02/12/2019 Melene Planachel E Kim, MD  OB FELLOW DISCHARGE ATTESTATION  I have seen and examined this patient and agree with above documentation in the resident's note.   Marcy Sirenatherine Orman Matsumura, D.O. OB Fellow  02/12/2019, 7:57 AM

## 2019-02-10 NOTE — Progress Notes (Signed)
LABOR PROGRESS NOTE  Izabela Ow is a 23 y.o. T3S2876 at [redacted]w[redacted]d  admitted for IOL for gHTN.   Subjective: Patient comfortable with epidural. No concerns or questions.   Objective: BP 130/79   Pulse (!) 118   Temp 98.6 F (37 C) (Oral)   Resp 16   Ht 5' 4.5" (1.638 m)   Wt 64.1 kg   LMP 05/17/2018 (Approximate)   SpO2 99%   BMI 23.88 kg/m  or  Vitals:   02/10/19 1646 02/10/19 1648 02/10/19 1651 02/10/19 1706  BP:  (!) 138/95 (!) 124/95 130/79  Pulse:  (!) 121 (!) 124 (!) 118  Resp:    16  Temp:      TempSrc:      SpO2: 96%  97% 99%  Weight:      Height:        Dilation: 6.5 Effacement (%): 80 Cervical Position: Middle Station: 0 Presentation: Vertex Exam by:: Delorise Shiner, RN FHT: baseline rate 125, moderate varibility, +acel, no decel Toco: q2-3 min   Labs: Lab Results  Component Value Date   WBC 10.4 02/10/2019   HGB 10.7 (L) 02/10/2019   HCT 30.9 (L) 02/10/2019   MCV 87.0 02/10/2019   PLT 212 02/10/2019    Patient Active Problem List   Diagnosis Date Noted  . Gestational hypertension 02/10/2019  . Family history of hypercoagulability 02/10/2019  . Late Entry Baby Scripts March 2020-social distancing 11/23/2018  . Supervision of low-risk pregnancy 08/18/2018  . Nausea and vomiting in pregnancy prior to [redacted] weeks gestation 07/06/2018    Assessment / Plan: 23 y.o. G4P1021 at [redacted]w[redacted]d here for IOL for gHTN. BPs normotensive. Patient asymptomatic.   Labor: Currently on Pitocin at 4 mu/min and progressing well with adequate contraction pattern. AROM performed with clear fluid.  Fetal Wellbeing:  Cat I  Pain Control: Epidural in place  Anticipated MOD:  NSVD   Phill Myron, D.O. OB Fellow  02/10/2019, 6:09 PM

## 2019-02-10 NOTE — Anesthesia Preprocedure Evaluation (Signed)
Anesthesia Evaluation  Patient identified by MRN, date of birth, ID band Patient awake    Reviewed: Allergy & Precautions, H&P , NPO status , Patient's Chart, lab work & pertinent test results  History of Anesthesia Complications Negative for: history of anesthetic complications  Airway Mallampati: II  TM Distance: >3 FB Neck ROM: full    Dental no notable dental hx. (+) Teeth Intact   Pulmonary neg pulmonary ROS, former smoker,    Pulmonary exam normal breath sounds clear to auscultation       Cardiovascular negative cardio ROS Normal cardiovascular exam Rhythm:regular Rate:Normal     Neuro/Psych PSYCHIATRIC DISORDERS Depression negative neurological ROS     GI/Hepatic Neg liver ROS, GERD  ,  Endo/Other  negative endocrine ROS  Renal/GU negative Renal ROS  negative genitourinary   Musculoskeletal Back pain   Abdominal   Peds  Hematology  (+) Blood dyscrasia, anemia ,   Anesthesia Other Findings   Reproductive/Obstetrics (+) Pregnancy                             Anesthesia Physical Anesthesia Plan  ASA: II  Anesthesia Plan: Epidural   Post-op Pain Management:    Induction:   PONV Risk Score and Plan:   Airway Management Planned:   Additional Equipment:   Intra-op Plan:   Post-operative Plan:   Informed Consent: I have reviewed the patients History and Physical, chart, labs and discussed the procedure including the risks, benefits and alternatives for the proposed anesthesia with the patient or authorized representative who has indicated his/her understanding and acceptance.       Plan Discussed with:   Anesthesia Plan Comments:         Anesthesia Quick Evaluation

## 2019-02-10 NOTE — MAU Provider Note (Signed)
Chief Complaint:  No chief complaint on file.   First Provider Initiated Contact with Patient 02/10/19 0424      HPI: Kaitlyn Whitaker is a 23 y.o. G4P1021 at 3438w3dwho presents to maternity admissions reporting elevated blood pressures at home as well as a headache, unrelieved by Tylenol.  She was evaluated earlier today and labs were normal   Decision was made that if her BPs became elevated again today, we would induce labor. She reports good fetal movement, denies LOF, vaginal bleeding, vaginal itching/burning, urinary symptoms, dizziness, n/v, diarrhea, constipation or fever/chills.  She denies visual changes or RUQ abdominal pain.  RN Note: PT SAYS SHE WAS HERE TODAY-  FOR HIGH- BP WENT T DOWN - SENT HOME.   AT 10PM-  STARTED H/A -  TOOK  TYLENOL AT 1030 AND 0245-  NO RELIEF- CHECKED BP AT HOME- 138/91- AT 0109.    AT 0120- 128/93.    CONTINUED TO CHECK-  BP WENT  DOWN.  BUT WHEN UP - BP INCREASES. .   Past Medical History: Past Medical History:  Diagnosis Date  . Anemia   . Back pain    Pt states chronic back pain after fall in 2016  . Depression    feeling balanced, no problems  . GERD (gastroesophageal reflux disease)     Past obstetric history: OB History  Gravida Para Term Preterm AB Living  4 1 1   2 1   SAB TAB Ectopic Multiple Live Births  2     0 1    # Outcome Date GA Lbr Len/2nd Weight Sex Delivery Anes PTL Lv  4 Current           3 Term 10/15/16 9122w6d 17:23 / 00:56 3368 g M Vag-Spont EPI  LIV     Birth Comments: wnl  2 SAB           1 SAB             Past Surgical History: Past Surgical History:  Procedure Laterality Date  . NASAL SINUS SURGERY    . TONSILLECTOMY      Family History: Family History  Problem Relation Age of Onset  . Diabetes Maternal Uncle   . Atrial fibrillation Mother   . Pulmonary embolism Mother   . Anemia Mother   . Hypertension Father   . Hypothyroidism Father     Social History: Social History   Tobacco Use  . Smoking  status: Former Smoker    Types: Cigarettes    Last attempt to quit: 01/13/2016    Years since quitting: 3.0  . Smokeless tobacco: Never Used  Substance Use Topics  . Alcohol use: Not Currently    Frequency: Never  . Drug use: Not Currently    Types: Marijuana    Comment: 4 years ago    Allergies: No Known Allergies  Meds:  Medications Prior to Admission  Medication Sig Dispense Refill Last Dose  . calcium carbonate (TUMS - DOSED IN MG ELEMENTAL CALCIUM) 500 MG chewable tablet Chew 1 tablet by mouth daily.   02/09/2019 at Unknown time  . diphenhydrAMINE (BENADRYL) 25 MG tablet Take 25 mg by mouth at bedtime as needed.   02/09/2019 at Unknown time  . ferrous sulfate 325 (65 FE) MG tablet Take 1 tablet (325 mg total) by mouth daily with breakfast. 30 tablet 3 02/09/2019 at Unknown time  . Prenatal Vit-Fe Fumarate-FA (PRENATAL VITAMIN) 27-0.8 MG TABS Take 1 tablet by mouth daily.   02/09/2019 at  Unknown time  . docusate sodium (COLACE) 100 MG capsule Take 1 capsule (100 mg total) by mouth 2 (two) times daily as needed for mild constipation or moderate constipation (The iron supplement may cause constipation.). 30 capsule 1 More than a month at Unknown time    I have reviewed patient's Past Medical Hx, Surgical Hx, Family Hx, Social Hx, medications and allergies.   ROS:  Review of Systems  Constitutional: Negative for chills and fever.  Eyes: Negative for visual disturbance.  Respiratory: Negative for shortness of breath.   Cardiovascular: Negative for leg swelling.  Gastrointestinal: Positive for abdominal pain. Negative for constipation, diarrhea and nausea.  Genitourinary: Negative for vaginal bleeding.  Neurological: Positive for headaches. Negative for weakness.   Other systems negative  Physical Exam   Patient Vitals for the past 24 hrs:  BP Temp Temp src Pulse SpO2 Height Weight  02/10/19 0431 136/78 - - 89 - - -  02/10/19 0430 (!) 130/92 - - (!) 107 - - -  02/10/19 0405 129/84  - - 93 99 % - -  02/10/19 0346 (!) 129/94 97.7 F (36.5 C) Oral (!) 106 - 5' 4.5" (1.638 m) 64.1 kg   Constitutional: Well-developed, well-nourished female in no acute distress.  Cardiovascular: normal rate and rhythm Respiratory: normal effort, clear to auscultation bilaterally GI: Abd soft, non-tender, gravid appropriate for gestational age.   No rebound or guarding. MS: Extremities nontender, no edema, normal ROM Neurologic: Alert and oriented x 4.  GU: Neg CVAT.  PELVIC EXAM:    FHT:  Baseline 140 , moderate variability, accelerations present, no decelerations Contractions:  Irregular    Labs: Results for orders placed or performed during the hospital encounter of 02/09/19 (from the past 24 hour(s))  CBC     Status: Abnormal   Collection Time: 02/09/19  1:34 PM  Result Value Ref Range   WBC 9.4 4.0 - 10.5 K/uL   RBC 4.08 3.87 - 5.11 MIL/uL   Hemoglobin 12.2 12.0 - 15.0 g/dL   HCT 16.135.3 (L) 09.636.0 - 04.546.0 %   MCV 86.5 80.0 - 100.0 fL   MCH 29.9 26.0 - 34.0 pg   MCHC 34.6 30.0 - 36.0 g/dL   RDW 40.913.8 81.111.5 - 91.415.5 %   Platelets 264 150 - 400 K/uL   nRBC 0.0 0.0 - 0.2 %  Comprehensive metabolic panel     Status: Abnormal   Collection Time: 02/09/19  1:34 PM  Result Value Ref Range   Sodium 135 135 - 145 mmol/L   Potassium 3.9 3.5 - 5.1 mmol/L   Chloride 106 98 - 111 mmol/L   CO2 18 (L) 22 - 32 mmol/L   Glucose, Bld 116 (H) 70 - 99 mg/dL   BUN 8 6 - 20 mg/dL   Creatinine, Ser 7.820.72 0.44 - 1.00 mg/dL   Calcium 9.1 8.9 - 95.610.3 mg/dL   Total Protein 6.4 (L) 6.5 - 8.1 g/dL   Albumin 2.7 (L) 3.5 - 5.0 g/dL   AST 26 15 - 41 U/L   ALT 13 0 - 44 U/L   Alkaline Phosphatase 309 (H) 38 - 126 U/L   Total Bilirubin 0.4 0.3 - 1.2 mg/dL   GFR calc non Af Amer >60 >60 mL/min   GFR calc Af Amer >60 >60 mL/min   Anion gap 11 5 - 15  Protein / creatinine ratio, urine     Status: Abnormal   Collection Time: 02/09/19  2:36 PM  Result Value Ref Range  Creatinine, Urine 125.98 mg/dL   Total  Protein, Urine 22 mg/dL   Protein Creatinine Ratio 0.17 (H) 0.00 - 0.15 mg/mg[Cre]   B/Positive/-- (12/17 1640)  Imaging:    MAU Course/MDM: I have reviewed results from earlier today NST reviewed, reactive Consult Dr Juleen China with presentation, exam findings and test results.  Treatments in MAU included EFM.    Assessment: SIUP at [redacted]w[redacted]d Gestational Hypertension with preeclampsia, severe features Headache  Plan: Admit to Labor and Delivery Routine orders Oxy IR for headache MD to follow   Hansel Feinstein CNM, MSN Certified Nurse-Midwife 02/10/2019 4:41 AM

## 2019-02-11 MED ORDER — NORETHINDRONE 0.35 MG PO TABS: ORAL_TABLET | ORAL | 11 refills | Status: DC

## 2019-02-11 NOTE — Progress Notes (Signed)
Post Partum Day 1 Subjective: no complaints, up ad lib, voiding and tolerating PO  Very drowsy.  Seen by Lactation an hour ago or so.    Objective: Blood pressure 102/65, pulse 64, temperature 97.9 F (36.6 C), temperature source Oral, resp. rate 18, height 5' 4.5" (1.638 m), weight 64.1 kg, last menstrual period 05/17/2018, SpO2 100 %, unknown if currently breastfeeding.  Physical Exam:  General: cooperative and no distress Lochia: appropriate Uterine Fundus: firm Incision: n/a DVT Evaluation: No evidence of DVT seen on physical exam.  Recent Labs    02/10/19 0457 02/10/19 1341  HGB 12.8 10.7*  HCT 36.8 30.9*    Assessment/Plan: Plan for discharge tomorrow and Breastfeeding   LOS: 1 day   Hansel Feinstein 02/11/2019, 6:22 AM
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
## 2019-02-11 NOTE — Discharge Instructions (Signed)

## 2019-02-11 NOTE — Anesthesia Postprocedure Evaluation (Signed)
Anesthesia Post Note  Patient: Kaitlyn Whitaker  Procedure(s) Performed: AN AD [place]     Patient location during evaluation: Mother Baby Anesthesia Type: Epidural Level of consciousness: awake, awake and alert and oriented Pain management: pain level controlled Vital Signs Assessment: post-procedure vital signs reviewed and stable Respiratory status: spontaneous breathing Cardiovascular status: blood pressure returned to baseline Postop Assessment: no headache, no backache, adequate PO intake, able to ambulate and no apparent nausea or vomiting Anesthetic complications: no    Last Vitals:  Vitals:   02/11/19 0550 02/11/19 0821  BP: 102/65 107/72  Pulse: 64 70  Resp: 18 17  Temp: 36.6 C 36.6 C  SpO2: 100%     Last Pain:  Vitals:   02/11/19 0825  TempSrc:   PainSc: 0-No pain   Pain Goal: Patients Stated Pain Goal: 4 (02/10/19 0956)              Epidural/Spinal Function Cutaneous sensation: Normal sensation (02/11/19 0825)  Kathie Rhodes

## 2019-02-11 NOTE — Lactation Note (Signed)
This note was copied from a baby's chart. Lactation Consultation Note Attempted to see mom and was sleeping.  Patient Name: Kaitlyn Whitaker [id number] Date: 02/11/2019     Maternal Data    Feeding Feeding Type: Breast Fed  LATCH Score Latch: Repeated attempts needed to sustain latch, nipple held in mouth throughout feeding, stimulation needed to elicit sucking reflex.  Audible Swallowing: None  Type of Nipple: Everted at rest and after stimulation  Comfort (Breast/Nipple): Soft / non-tender  Hold (Positioning): Full assist, staff holds infant at breast  [facility] Score: 5  Interventions    Lactation Tools Discussed/Used     Consult Status      [name] G 02/11/2019, 4:22 AM

## 2019-02-11 NOTE — Lactation Note (Signed)
This note was copied from a baby's chart. Lactation Consultation Note  Patient Name: Kaitlyn Whitaker STMHD'Q Date: 02/11/2019 Reason for consult: Initial assessment;Early term 69-38.6wks  Baby is 66 hours old  [place] reported to this [facility] mom attempted to breast feed only for few days.  As [name] entered per mom was just finishing feeding the baby at the breast For 20 mins with swallows and comfort.  LC reviewed basics , importance of skin to skin feedings until baby is back  To birth weight and gaining steadily, and can stay awake for majority of feeding.  Prior  To latching apply warm wash cloth,  breast massage, hand express to  Prime milk ducts to enhance let down.  Since baby just fed for 20 mins , LC unable to see a latch at this  consult. LC  Encouraged mom to call with  Feeding cues for RN or LC.    Mom aware of the [place] Lactation services after D/C.     Maternal Data    Feeding Feeding Type: (per mom baby just recently fed for 20 mins- best feeding yet)  LATCH Score -  ( Latch score by the [facility] )  Latch: Grasps breast easily, tongue down, lips flanged, rhythmical sucking.  Audible Swallowing: A few with stimulation  Type of Nipple: Everted at rest and after stimulation  Comfort (Breast/Nipple): Soft / non-tender  Hold (Positioning): Assistance needed to correctly position infant at breast and maintain latch.  LATCH Score: 8  Interventions Interventions: Breast feeding basics reviewed  Lactation Tools Discussed/Used     Consult Status Consult Status: Follow-up Date: 02/12/19 Follow-up type: In-patient    [name] 02/11/2019, 5:17 PM

## 2019-02-12 MED ORDER — IBUPROFEN 600 MG PO TABS: 600.0000 mg | ORAL_TABLET | Freq: Four times a day (QID) | ORAL | 0 refills | Status: DC

## 2019-02-12 NOTE — Lactation Note (Signed)
This note was copied from a baby's chart. Lactation Consultation Note  Patient Name: Kaitlyn Whitaker BXIDH'[id number] Date: 02/12/2019 Reason for consult: Infant weight loss;Early term 37-38.6wks(8 % weight loss /F/U at [place] pedis tomorrow)  Baby is 44 hours old / for D/C per Dr. Janne Lab - cross cradle / depth / swallows / baby ate for 10 mins.  Mom had mentioned right sore nipple , no breakdown.  LC instructed mom on the use hand pump in the DEBP kit, shells, and comfort gels x 6 days.  Per [place] [facility] called her this am and signed up the baby.  [place] asked mom to call [place] back and check to see if she could obtain a DEBP today.  In the mean time [facility] faxed a referral for a DEBP to [place] / [place] .  [place] asked mom to call [place] if WIC was unable to provide a DEBP for her today.  [place] Tish Men informed the [facility] mom wanted to have her 2 meds checked to see if they are  [facility] with breast feeding prior to calling [place]. If not ok she was going to formula feed her baby.  [place] informed the RN the 2 meds - Lexpro L2 ok , and Cymbalta L 3 ok per Rodell Perna - Mother's milk and drugs  Handbook used as a reference.  Mom was discharged and did not call LC to let her know she obtained the [place] today or what her  Feeding preference was.  LC had shown mom how to use the hand pump from the DEBP and informed mom prior to feeding after breast massage, hand express, pre-pump to prime the milk ducts to help the weight gain.  Sore nipple and engorgement prevention and tx reviewed.  See doc flow sheets for feeding assessment with LC this am. Per mom was comfortable.   LC informed mom of the [place] resources after D/C. And reminded mom the WIC/ [place] is also has BF support.    Maternal Data Has patient been taught Hand Expression?: Yes Does the patient have breastfeeding experience prior to this delivery?: Yes  Feeding Feeding Type: Breast Fed  LATCH Score Latch: Grasps breast easily, tongue down, lips flanged,  rhythmical sucking.  Audible Swallowing: A few with stimulation  Type of Nipple: Everted at rest and after stimulation  Comfort (Breast/Nipple): Soft / non-tender  Hold (Positioning): Assistance needed to correctly position infant at breast and maintain latch.  LATCH Score: 8  Interventions Interventions: Breast feeding basics reviewed;Assisted with latch;Skin to skin;Breast massage;Hand express;Breast compression;Adjust position;Support pillows;Position options;Shells;Coconut oil;Comfort gels;Hand pump;DEBP  Lactation Tools Discussed/Used WIC Program: Yes Pump Review: Setup, frequency, and cleaning;Milk Storage Initiated by:: MAI Date initiated:: 02/12/19   Consult Status Consult Status: Follow-up Date: (LC recommended if weight is now coming up steadily to call [facility] at [facility] Monday for [facility]) Follow-up type: Out-patient(mom receptive to F/U)    Jerlyn Ly Arena Lind[place] 02/12/2019, 9:50 AM

## 2019-02-17 ENCOUNTER — Telehealth: Payer: Self-pay | Admitting: Family Medicine

## 2019-02-17 NOTE — Telephone Encounter (Signed)
Attempted to call patient about her appointment on 6/18 @ 10:20. No answer left detailed voicemail instructing the patient to wear a face mask for the entire appointment and no visitors are allowed. Patient instructed that if she has any symptoms to not come to the appointment and give the office a call to be rescheduled. Office number and list of symptoms were left.

## 2019-02-18 ENCOUNTER — Ambulatory Visit (INDEPENDENT_AMBULATORY_CARE_PROVIDER_SITE_OTHER): Payer: Medicaid Other | Admitting: General Practice

## 2019-02-18 ENCOUNTER — Other Ambulatory Visit: Payer: Self-pay

## 2019-02-18 VITALS — BP 114/77 | HR 90

## 2019-02-18 DIAGNOSIS — Z0130 Encounter for examination of blood pressure without abnormal findings: Secondary | ICD-10-CM

## 2019-02-18 NOTE — Progress Notes (Signed)
Chart reviewed - agree with RN documentation.   

## 2019-02-18 NOTE — Progress Notes (Signed)
Patient presents to office today for blood pressure check following vaginal delivery on 6/10. Patient was induced for gestational HTN and was not sent home on any medication. Patient reports occasional headaches but denies dizziness or blurry vision. BP is WNL today. Patient will follow up at scheduled pp visit on 7/8.  Koren Bound RN BSN 02/18/19

## 2019-02-22 ENCOUNTER — Other Ambulatory Visit: Payer: Medicaid Other

## 2019-02-22 ENCOUNTER — Encounter: Payer: Medicaid Other | Admitting: Obstetrics and Gynecology

## 2019-03-09 ENCOUNTER — Telehealth: Payer: Self-pay | Admitting: Nurse Practitioner

## 2019-03-09 ENCOUNTER — Telehealth: Payer: Self-pay | Admitting: Student

## 2019-03-09 NOTE — Telephone Encounter (Signed)
The patient called in to reschedule the appointment. She stated she has an interview at the appointment time. Stated she could attend the appointment later in the afternoon. Informed the patient of the log in method as the patient has Sport and exercise psychologist. The patient verbalized understanding.

## 2019-03-09 NOTE — Telephone Encounter (Signed)
Attempted to call patient about her appointment on 7/8 @ 3:15. No answer, left voicemail instructing patient this is a Advertising account planner appointment. Patient instructed to make sure she has the [employer] app downloaded prior to her appointment. Patient advised to give the office a call with any questions.

## 2019-03-10 ENCOUNTER — Encounter: Payer: Self-pay | Admitting: Nurse Practitioner

## 2019-03-10 ENCOUNTER — Other Ambulatory Visit: Payer: Self-pay

## 2019-03-10 ENCOUNTER — Telehealth: Payer: Medicaid Other | Admitting: Nurse Practitioner

## 2019-03-10 ENCOUNTER — Telehealth (INDEPENDENT_AMBULATORY_CARE_PROVIDER_SITE_OTHER): Payer: Medicaid Other | Admitting: Nurse Practitioner

## 2019-03-10 DIAGNOSIS — Z1389 Encounter for screening for other disorder: Secondary | ICD-10-CM

## 2019-03-10 DIAGNOSIS — Z3041 Encounter for surveillance of contraceptive pills: Secondary | ICD-10-CM

## 2019-03-10 NOTE — Progress Notes (Signed)
2:51pm I called Mikiah to see if she can do her virtual visit early; but she is unable to and I will call her back closer to her appointment time. Linda,RN I connected with  Kaitlyn Whitaker on 03/10/19 at  3:15 PM EDT by telephone and verified that I am speaking with the correct person using two identifiers.   MyChart video visit I discussed the limitations, risks, security and privacy concerns of performing an evaluation and management service by telephone and virtual and the availability of in person appointments. I also discussed with the patient that there may be a patient responsible charge related to this service. The patient expressed understanding and agreed to proceed.  Vaughan Basta, RN Subjective:     Kaitlyn Whitaker is a 23 y.o. female who presents for a postpartum visit. She is 4 weeks postpartum following a spontaneous vaginal delivery. I have fully reviewed the prenatal and intrapartum course. Labor was induced due to gestational hypertension.  The delivery was at 65 gestational weeks. Outcome: spontaneous vaginal delivery. Anesthesia: epidural. Postpartum course has been good. Baby's course has been good. Baby is feeding by bottle - Enfamil Nutramigen. Bleeding no bleeding. Bowel function is normal. Bladder function is normal. Patient is not sexually active. Contraception method is micronor due to her family history of clots. Postpartum depression screening: negative.  The following portions of the patient's history were reviewed and updated as appropriate: allergies, current medications, past family history, past medical history, past social history, past surgical history and problem list.  Review of Systems Pertinent items noted in HPI and remainder of comprehensive ROS otherwise negative.   Objective:    There were no vitals taken for this visit.   BP 110/86 per patient.                                       GENERAL: Well-developed, well-nourished female in no acute  distress.  HEENT: Normocephalic, atraumatic.  LUNGS: Effort normal  SKIN: Warm and without erythema  PSYCH: Normal mood and affect   Assessment:    Video postpartum exam. Pap smear not done at today's visit.   Plan:    1. Contraception: oral progesterone-only contraceptive 2. Continues to check BP and has not had elevated BPs postpartum 3. Follow up in: 1 year or as needed.   4.  Continue to see PCP for management of cymbalta and lexapro. 03/10/2019  3:13 PM

## 2019-06-28 ENCOUNTER — Other Ambulatory Visit: Payer: Self-pay

## 2019-06-28 ENCOUNTER — Encounter: Payer: Self-pay | Admitting: Psychiatry

## 2019-06-28 ENCOUNTER — Ambulatory Visit (INDEPENDENT_AMBULATORY_CARE_PROVIDER_SITE_OTHER): Payer: Medicaid Other | Admitting: Psychiatry

## 2019-06-28 DIAGNOSIS — F3181 Bipolar II disorder: Secondary | ICD-10-CM | POA: Diagnosis not present

## 2019-06-28 MED ORDER — DULOXETINE HCL 60 MG PO CPEP: 60.0000 mg | ORAL_CAPSULE | Freq: Every day | ORAL | 1 refills | Status: DC

## 2019-06-28 MED ORDER — LAMOTRIGINE 25 MG PO TABS: ORAL_TABLET | ORAL | 0 refills | Status: DC

## 2019-06-28 NOTE — Progress Notes (Signed)
Psychiatric Initial Adult Assessment  I connected with  Kaitlyn Whitaker on 06/28/19 by a video enabled telemedicine application and verified that I am speaking with the correct person using two identifiers.   I discussed the limitations of evaluation and management by telemedicine. The patient expressed understanding and agreed to proceed.    Patient Identification: Kaitlyn Whitaker MRN:  [id number] Date of Evaluation:  06/28/2019   Referral Source: Olena LeatherwoodEugene D Farrug, NP   Chief Complaint:   " I believe I have Bipolar disorder."  Visit Diagnosis:    ICD-10-CM   1. Bipolar 2 disorder (HCC)  F31.81 DULoxetine (CYMBALTA) 60 MG capsule    lamoTRIgine (LAMICTAL) 25 MG tablet    History of Present Illness:  Pt reported that she has noticed that she has episodes of depression cycling with highs. She stated that she noticed feeling very depressed when she was in middle school. She recalls having no energy to do anything and having passive suicidal ideations. She also tried cutting herself as a way of non suicidal self injurious behavior to make herself feel better. As she grew older, she started to have more marked episodes of depression with hypomania. Lately, she has noted having 2-3 weeks of depressed phase when she feels like she has no energy to get out of bed to take care of her 2 young children. She has crying spells. She reported anhedonia, fair sleep, poor concentration and poor appetite during these depressive phases. During her hypomanic phases, she feels she has too much energy. She makes impulsive and reckless decisions like going on shopping sprees. She will stay up almost whole night. These hypomanic episodes don't last for more than 2-3 days at a time. She also reported feeling irritable and snapping easily at people. She has not had suicidal ideations recently. She denied any prior suicide attempts. She also reported feeling anxious at times. She worried about trivial issues and  worries that she may lose her job as a result. She also has a hx of ADD since young age.  She was started on Cymbalta 60 mg more than a year ago and lexapro was added later. She feels the combination has helped her but is not completely satisfied.  Associated Signs/Symptoms: Depression Symptoms:  See HPI (Hypo) Manic Symptoms:  See HPI Anxiety Symptoms:  See HPI Psychotic Symptoms:  denied PTSD Symptoms: Negative  Past Psychiatric History: Depression, anxiety, suspected bipolar d/o  Previous Psychotropic Medications: Yes   Substance Abuse History in the last 12 months:  No.  Consequences of Substance Abuse: Negative  Past Medical History:  Past Medical History:  Diagnosis Date  . Anemia   . Back pain    Pt states chronic back pain after fall in 2016  . Depression    feeling balanced, no problems  . GERD (gastroesophageal reflux disease)     Past Surgical History:  Procedure Laterality Date  . NASAL SINUS SURGERY    . TONSILLECTOMY      Family Psychiatric History: father-suspected bipolar d/o  Family History:  Family History  Problem Relation Age of Onset  . Diabetes Maternal Uncle   . Atrial fibrillation Mother   . Pulmonary embolism Mother   . Anemia Mother   . Hypertension Father   . Hypothyroidism Father     Social History:   Social History   Socioeconomic History  . Marital status: Single    Spouse name: Not on file  . Number of children: 1  . Years of education: 4912  .  Highest education level: 12th grade  Occupational History  . Not on file  Social Needs  . Financial resource strain: Not on file  . Food insecurity    Worry: Sometimes true    Inability: Sometimes true  . Transportation needs    Medical: No    Non-medical: No  Tobacco Use  . Smoking status: Former Smoker    Types: Cigarettes    Quit date: 01/13/2016    Years since quitting: 3.4  . Smokeless tobacco: Never Used  Substance and Sexual Activity  . Alcohol use: Not Currently     Frequency: Never  . Drug use: Not Currently    Types: Marijuana    Comment: 4 years ago  . Sexual activity: Not Currently    Birth control/protection: Pill  Lifestyle  . Physical activity    Days per week: Not on file    Minutes per session: Not on file  . Stress: Not on file  Relationships  . Social Musician on phone: Not on file    Gets together: Not on file    Attends religious service: Not on file    Active member of club or organization: Not on file    Attends meetings of clubs or organizations: Not on file    Relationship status: Not on file  Other Topics Concern  . Not on file  Social History Narrative  . 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 Has 2 children-35 y/o and 73 month old. Is well-supported by parents.  Allergies:  No Known Allergies  Metabolic Disorder Labs: No results found for: HGBA1C, MPG No results found for: PROLACTIN No results found for: CHOL, TRIG, HDL, CHOLHDL, VLDL, LDLCALC Lab Results  Component Value Date   TSH 2.757 09/14/2016    Therapeutic Level Labs: No results found for: LITHIUM No results found for: CBMZ No results found for: VALPROATE  Current Medications: Current Outpatient Medications  Medication Sig Dispense Refill  . calcium carbonate (TUMS - DOSED IN MG ELEMENTAL CALCIUM) 500 MG chewable tablet Chew 1 tablet by mouth daily.    . diphenhydrAMINE (BENADRYL) 25 MG tablet Take 25 mg by mouth at bedtime as needed.    . docusate sodium (COLACE) 100 MG capsule Take 1 capsule (100 mg total) by mouth 2 (two) times daily as needed for mild constipation or moderate constipation (The iron supplement may cause constipation.). 30 capsule 1  . DULoxetine (CYMBALTA) 60 MG capsule Take 1 capsule (60 mg total) by mouth daily. 30 capsule 1  . ferrous sulfate 325 (65 FE) MG tablet Take 1 tablet (325 mg  total) by mouth daily with breakfast. 30 tablet 3  . ibuprofen (ADVIL) 600 MG tablet Take 1 tablet (600 mg total) by mouth every 6 (six) hours. 30 tablet 0  . lamoTRIgine (LAMICTAL) 25 MG tablet Take 1 tablet daily for 2 weeks then take 2 tablets daily 66 tablet 0  . norethindrone (MICRONOR) 0.35 MG tablet Start on the Sunday after baby turns 52 weeks old 1 Package 11  . Prenatal Vit-Fe Fumarate-FA (PRENATAL VITAMIN) 27-0.8 MG TABS Take 1 tablet by mouth daily.     No current facility-administered medications for this visit.     Musculoskeletal: Strength & Muscle Tone: unable to assess due to telemed visit Gait & Station: unable to assess due to telemed visit Patient leans: unable to  assess due to telemed visit  Psychiatric Specialty Exam: ROS  unknown if currently breastfeeding.There is no height or weight on file to calculate BMI.  General Appearance: Well Groomed  Eye Contact:  Good  Speech:  Clear and Coherent and Normal Rate  Volume:  Normal  Mood:  Euthymic  Affect:  Congruent  Thought Process:  Goal Directed, Linear and Descriptions of Associations: Intact  Orientation:  Full (Time, Place, and Person)  Thought Content:  Logical  Suicidal Thoughts:  No  Homicidal Thoughts:  No  Memory:  Recent;   Good Remote;   Good  Judgement:  Fair  Insight:  Good and Fair  Psychomotor Activity:  Normal  Concentration:  Concentration: Good and Attention Span: Good  Recall:  Good  Fund of Knowledge:Good  Language: Good  Akathisia:  Negative  Handed:  Right  AIMS (if indicated):  not done  Assets:  Communication Skills Desire for Improvement Financial Resources/Insurance Resilience Social Support Talents/Skills  ADL's:  Intact  Cognition: WNL  Sleep:  Good   Screenings: GAD-7     Video Visit from 02/09/2019 in [place] for [facility] Routine Prenatal from 02/02/2019 in [place] for [facility] Routine Prenatal from 10/28/2018 in [place] for [facility] Routine Prenatal from 09/28/2018 in [place] for [facility] Initial Prenatal from 08/31/2018 in [place] for [facility]  Total GAD-7 Score  0  0  0  0  0    PHQ2-9     Video Visit from 02/09/2019 in [place] for [facility] Routine Prenatal from 02/02/2019 in [place] for [facility] Routine Prenatal from 10/28/2018 in [place] for [facility] Routine Prenatal from 09/28/2018 in [place] for [facility] Initial Prenatal from 08/31/2018 in [place] for [place]  PHQ-2 Total Score  0  0  0  0  0  PHQ-9 Total Score  0  0  0  0  1      Assessment and Plan: 23 y/o female with hx of depression now evaluated and meets criteria for bipolar 2 disorder. Lamictal was offered in conjunction with Cymbalta. Potential side effects of medication and risks vs benefits of treatment vs non-treatment were explained and discussed. All questions were answered. Lexapro was discontinued due to drug interaction with Cymbalta and risk for serotonin syndrome.  1. Bipolar 2 disorder (HCC)  - Continue DULoxetine (CYMBALTA) 60 MG capsule; Take 1 capsule (60 mg total) by mouth daily.  Dispense: 30 capsule; Refill: 1 - Start lamoTRIgine (LAMICTAL) 25 MG tablet; Take 1 tablet daily for 2 weeks then take 2 tablets daily  Dispense: 66 tablet; Refill: 0  F/up in 4 weeks.  [place] Crane, MD 10/26/20202:17 PM

## 2019-07-27 ENCOUNTER — Encounter: Payer: Self-pay | Admitting: Psychiatry

## 2019-07-27 ENCOUNTER — Ambulatory Visit (INDEPENDENT_AMBULATORY_CARE_PROVIDER_SITE_OTHER): Payer: Medicaid Other | Admitting: Psychiatry

## 2019-07-27 ENCOUNTER — Other Ambulatory Visit: Payer: Self-pay

## 2019-07-27 DIAGNOSIS — F3181 Bipolar II disorder: Secondary | ICD-10-CM | POA: Diagnosis not present

## 2019-07-27 MED ORDER — LAMOTRIGINE 25 MG PO TABS: ORAL_TABLET | ORAL | 2 refills | Status: DC

## 2019-07-27 MED ORDER — DULOXETINE HCL 60 MG PO CPEP: 60.0000 mg | ORAL_CAPSULE | Freq: Every day | ORAL | 2 refills | Status: DC

## 2019-07-27 NOTE — Progress Notes (Signed)
Psychiatric OP Progress Note   I connected with  Kaitlyn Whitaker on 07/27/19 by a video enabled telemedicine application and verified that I am speaking with the correct person using two identifiers.   I discussed the limitations of evaluation and management by telemedicine. The patient expressed understanding and agreed to proceed.    Patient Identification: Kaitlyn Whitaker MRN:  [id number] Date of Evaluation:  07/27/2019    Chief Complaint:   " I am doing better."  Visit Diagnosis:    ICD-10-CM   1. Bipolar 2 disorder (HCC)  F31.81     History of Present Illness:  Pt reported that she has done well with the combination of Cymbalta and Lamictal. She feels her mood has been more stable. She is not as irritable as she was in the past. She informed she is sleeping well. She informed that over the last month her 43 year old son was diagnosed with leukemia. She has been busy with his care and follow appointments. He is connected with hemato-oncologists who she trusts. She stated that the next few months will be busy for her as so requested f/up in 3 months. She does not think her current medication doses need any adjustment at this point.   Past Psychiatric History: Depression, anxiety, suspected bipolar d/o  Previous Psychotropic Medications: Yes   Substance Abuse History in the last 12 months:  No.  Consequences of Substance Abuse: Negative  Past Medical History:  Past Medical History:  Diagnosis Date  . Anemia   . Back pain    Pt states chronic back pain after fall in 2016  . Depression    feeling balanced, no problems  . GERD (gastroesophageal reflux disease)     Past Surgical History:  Procedure Laterality Date  . NASAL SINUS SURGERY    . TONSILLECTOMY      Family Psychiatric History: father-suspected bipolar d/o  Family History:  Family History  Problem Relation Age of Onset  . Diabetes Maternal Uncle   . Atrial fibrillation Mother   . Pulmonary embolism  Mother   . Anemia Mother   . Hypertension Father   . Hypothyroidism Father     Social History:   Social History   Socioeconomic History  . Marital status: Single    Spouse name: Not on file  . Number of children: 1  . Years of education: 54  . Highest education level: 12th grade  Occupational History  . Not on file  Social Needs  . Financial resource strain: Not on file  . Food insecurity    Worry: Sometimes true    Inability: Sometimes true  . Transportation needs    Medical: No    Non-medical: No  Tobacco Use  . Smoking status: Former Smoker    Types: Cigarettes    Quit date: 01/13/2016    Years since quitting: 3.5  . Smokeless tobacco: Never Used  Substance and Sexual Activity  . Alcohol use: Not Currently    Frequency: Never  . Drug use: Not Currently    Types: Marijuana    Comment: 4 years ago  . Sexual activity: Not Currently    Birth control/protection: Pill  Lifestyle  . Physical activity    Days per week: Not on file    Minutes per session: Not on file  . Stress: Not on file  Relationships  . Social Musician on phone: Not on file    Gets together: Not on file    Attends religious  service: Not on file    Active member of club or organization: Not on file    Attends meetings of clubs or organizations: Not on file    Relationship status: Not on file  Other Topics Concern  . Not on file  Social History Narrative  . 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 Has 2 children-3 y/o and 77 month old. Is well-supported by parents.  Allergies:  No Known Allergies  Metabolic Disorder Labs: No results found for: HGBA1C, MPG No results found for: PROLACTIN No results found for: CHOL, TRIG, HDL, CHOLHDL, VLDL, LDLCALC Lab Results  Component Value Date   TSH 2.757 09/14/2016    Therapeutic Level Labs: No results found  for: LITHIUM No results found for: CBMZ No results found for: VALPROATE  Current Medications: Current Outpatient Medications  Medication Sig Dispense Refill  . calcium carbonate (TUMS - DOSED IN MG ELEMENTAL CALCIUM) 500 MG chewable tablet Chew 1 tablet by mouth daily.    . diphenhydrAMINE (BENADRYL) 25 MG tablet Take 25 mg by mouth at bedtime as needed.    . docusate sodium (COLACE) 100 MG capsule Take 1 capsule (100 mg total) by mouth 2 (two) times daily as needed for mild constipation or moderate constipation (The iron supplement may cause constipation.). 30 capsule 1  . DULoxetine (CYMBALTA) 60 MG capsule Take 1 capsule (60 mg total) by mouth daily. 30 capsule 1  . ferrous sulfate 325 (65 FE) MG tablet Take 1 tablet (325 mg total) by mouth daily with breakfast. 30 tablet 3  . ibuprofen (ADVIL) 600 MG tablet Take 1 tablet (600 mg total) by mouth every 6 (six) hours. 30 tablet 0  . lamoTRIgine (LAMICTAL) 25 MG tablet Take 1 tablet daily for 2 weeks then take 2 tablets daily 66 tablet 0  . norethindrone (MICRONOR) 0.35 MG tablet Start on the Sunday after baby turns 62 weeks old 1 Package 11  . Prenatal Vit-Fe Fumarate-FA (PRENATAL VITAMIN) 27-0.8 MG TABS Take 1 tablet by mouth daily.     No current facility-administered medications for this visit.     Musculoskeletal: Strength & Muscle Tone: unable to assess due to telemed visit Gait & Station: unable to assess due to telemed visit Patient leans: unable to assess due to telemed visit  Psychiatric Specialty Exam: ROS  unknown if currently breastfeeding.There is no height or weight on file to calculate BMI.  General Appearance: Well Groomed  Eye Contact:  Good  Speech:  Clear and Coherent and Normal Rate  Volume:  Normal  Mood:  Euthymic  Affect:  Congruent  Thought Process:  Goal Directed, Linear and Descriptions of Associations: Intact  Orientation:  Full (Time, Place, and Person)  Thought Content:  Logical  Suicidal Thoughts:   No  Homicidal Thoughts:  No  Memory:  Recent;   Good Remote;   Good  Judgement:  Fair  Insight:  Good and Fair  Psychomotor Activity:  Normal  Concentration:  Concentration: Good and Attention Span: Good  Recall:  Good  Fund of Knowledge:Good  Language: Good  Akathisia:  Negative  Handed:  Right  AIMS (if indicated):  not done  Assets:  Communication Skills Desire for Improvement Financial Resources/Insurance Resilience Social Support Talents/Skills  ADL's:  Intact  Cognition: WNL  Sleep:  Good   Screenings: GAD-7     [facility] [place]  [facility] Rout[facility] P[facility] from [facility] [place] [facility] Rout[facility] from [facility]ine [facility] from 09/[facility]/2018 in [place] [facility] Initial Prenatal [facility]  Total GAD-7 Score  0  0  0  0  0    PHQ2-9     [facility] [place] [facility] Routine Prenatal from 02/02/2019 in [place] [facility] Routine Prenatal from [facility] Routine Prenatal from 09/28/2018 in [facility] Initial Prenatal from 08/31/2018 in [place] [facility]  PHQ-2 Total Score  0  0  0  0  0  PHQ-9 Total Score  0  0  0  0  1      Assessment and Plan: 23 y/o female with hx of Bipolar 2 d/o seen for follow up. She appears to be doing well on her current regimen. Her son was recently diagnosed with leukemia and she is feeling optimistic about his care and prognosis.  1. Bipolar 2 disorder (HCC)  - Continue DULoxetine (CYMBALTA) 60 MG capsule; Take 1 capsule (60 mg total) by mouth daily.  Dispense: 30 capsule; Refill: 1 - Continue lamoTRIgine (LAMICTAL) 25 MG tablet; Take 1 tablet daily for 2 weeks then take 2 tablets daily  Dispense: 66  tablet; Refill: 0  F/up in 3 months.  Zena AmosMandeep Teondra [place], MD 11/24/20208:44 AM

## 2019-08-06 IMAGING — US US MFM OB FOLLOW UP
1 series · 12 of 28 positions shown · non-contrast
Comparison: none

[Series 1: us mfm ob follow up · 56 acquisitions, 12 frames shown]
[im 3/56]
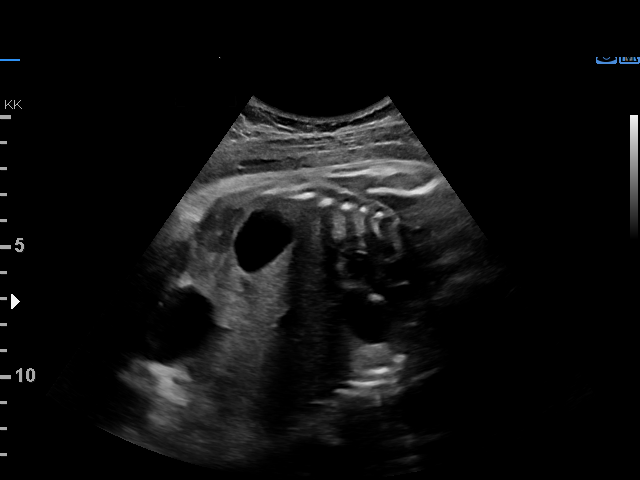
[im 7/56]
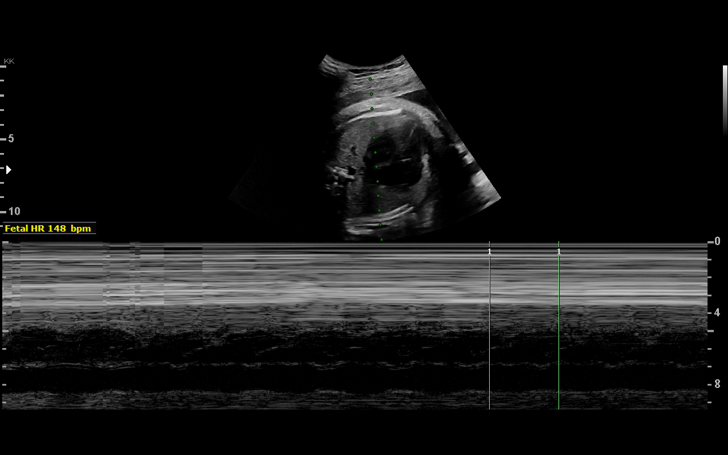
[im 11/56]
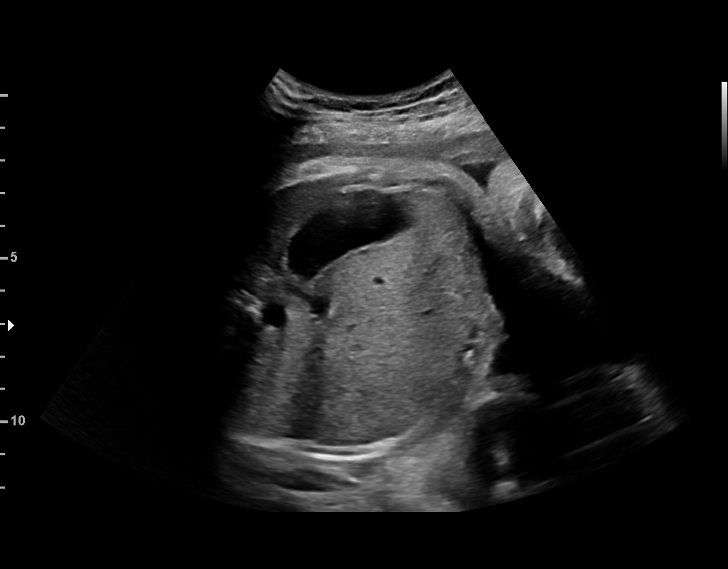
[im 17/56]
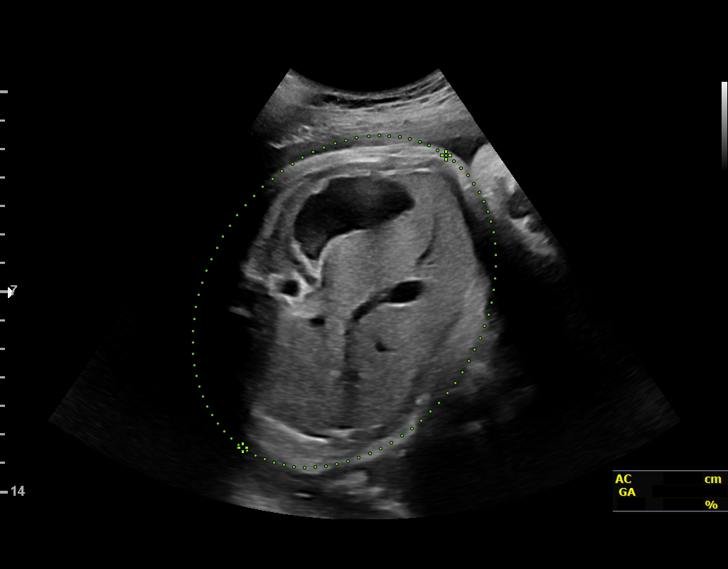
[im 21/56]
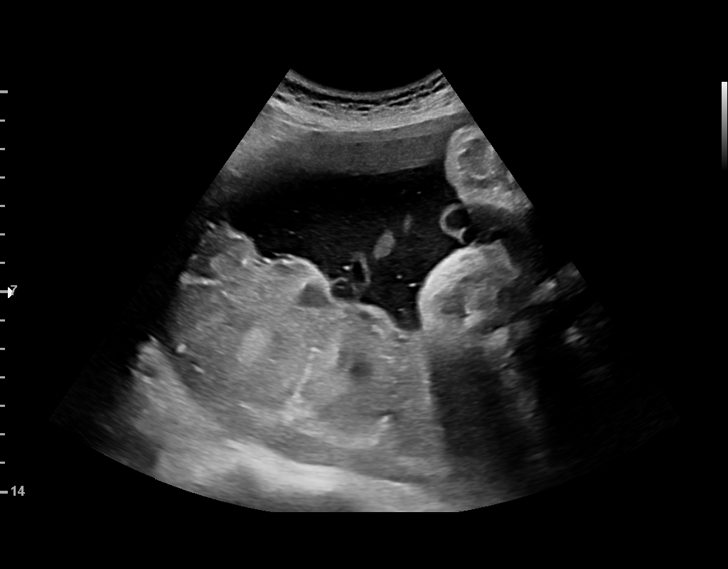
[im 25/56]
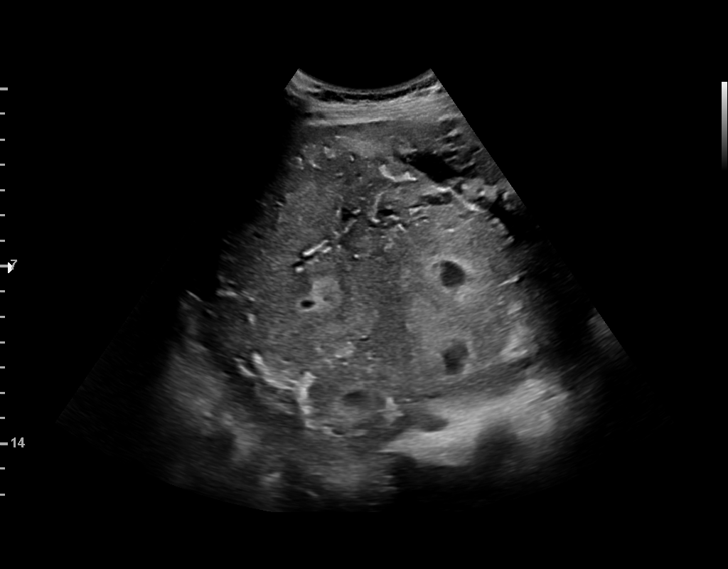
[im 31/56]
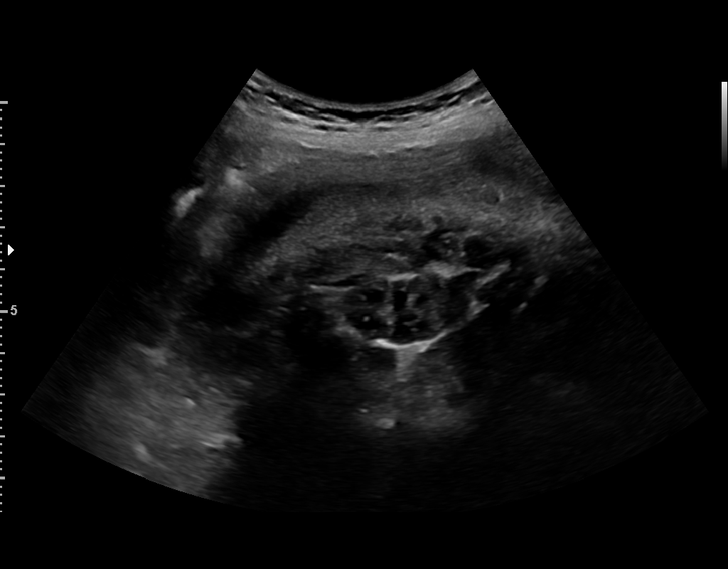
[im 35/56]
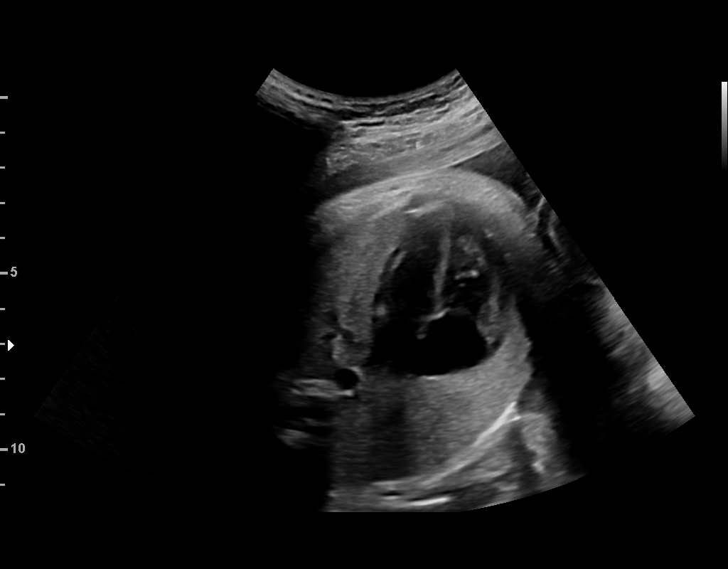
[im 39/56]
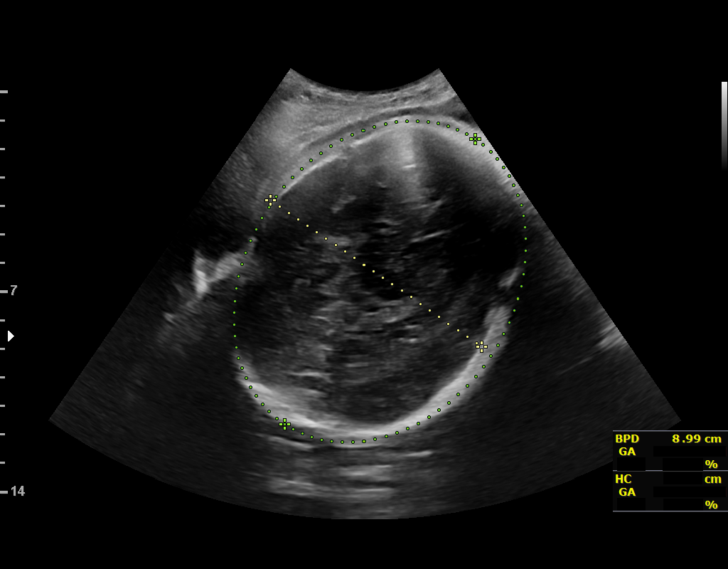
[im 45/56]
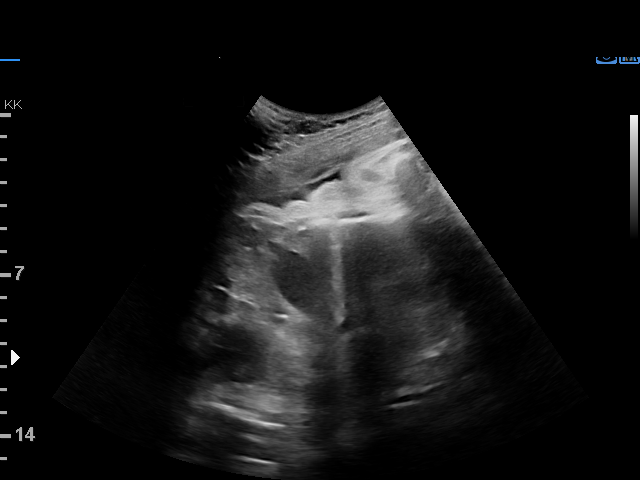
[im 49/56]
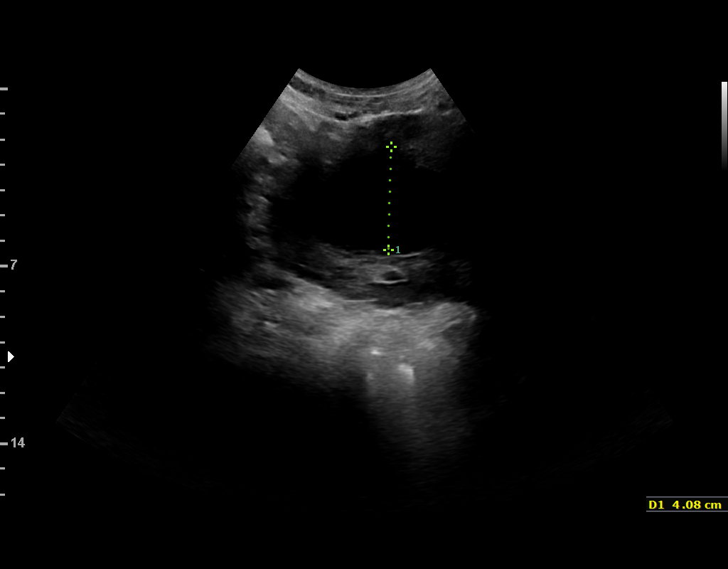
[im 53/56]
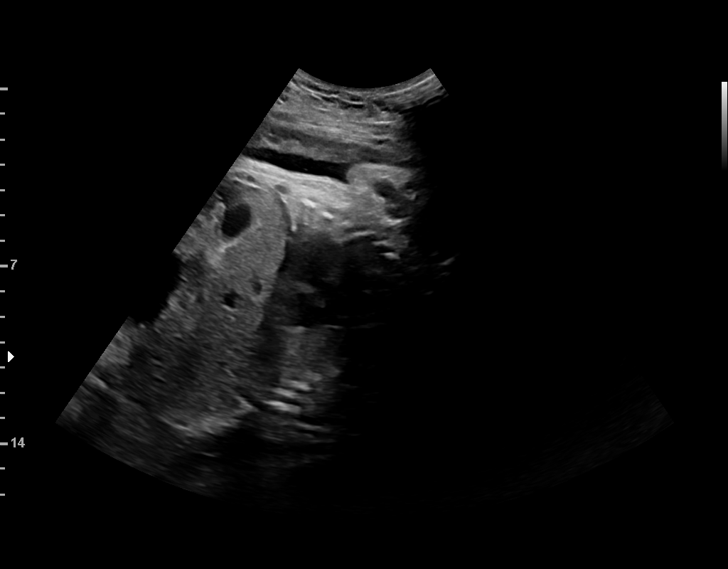

[12 of 28 positions shown; findings below may reference images not displayed]

[address]

 ----------------------------------------------------------------------

 ----------------------------------------------------------------------
Indications

  36 weeks gestation of pregnancy
  Uterine size-date discrepancy, third trimester
  Encounter for other antenatal screening
  follow-up
 ----------------------------------------------------------------------
Vital Signs

                                                Height:        5'5"
Fetal Evaluation

 Num Of Fetuses:          1
 Fetal Heart Rate(bpm):   148
 Cardiac Activity:        Observed
 Presentation:            Cephalic
 Placenta:                Posterior
 P. Cord Insertion:       Previously Visualized

 Amniotic Fluid
 AFI FV:      Within normal limits

 AFI Sum(cm)     %Tile       Largest Pocket(cm)
 7.89            7

 RUQ(cm)                     LUQ(cm)        LLQ(cm)

Biometry

 BPD:        90  mm     G. Age:  36w 4d         56  %    CI:        71.95   %    70 - 86
                                                         FL/HC:       20.8  %    20.8 -
 HC:      337.7  mm     G. Age:  38w 5d         72  %    HC/AC:       0.98       0.92 -
 AC:      345.4  mm     G. Age:  38w 3d         94  %    FL/BPD:      78.0  %    71 - 87
 FL:       70.2  mm     G. Age:  36w 0d         30  %    FL/AC:       20.3  %    20 - 24
 Est. FW:    2961   gm     7 lb 4 oz     83  %
OB History

 Gravidity:    4         Term:   1         SAB:   2
 Living:       1
Gestational Age

 LMP:           36w 5d        Date:  05/17/18                 EDD:   02/21/19
 U/S Today:     37w 3d                                        EDD:   02/16/19
 Best:          36w 5d     Det. By:  LMP  (05/17/18)          EDD:   02/21/19
Anatomy

 Cranium:               Appears normal         Aortic Arch:            Previously seen
 Cavum:                 Previously seen        Ductal Arch:            Previously seen
 Ventricles:            Previously seen        Diaphragm:              Appears normal
 Choroid Plexus:        Previously seen        Stomach:                Appears normal, left
                                                                       sided
 Cerebellum:            Previously seen        Abdomen:                Appears normal
 Posterior Fossa:       Previously seen        Abdominal Wall:         Previously seen
 Nuchal Fold:           Previously seen        Cord Vessels:           Previously seen
 Face:                  Orbits and profile     Kidneys:                Appear normal
                        previously seen
 Lips:                  Previously seen        Bladder:                Appears normal
 Thoracic:              Appears normal         Spine:                  Previously seen
 Heart:                 Appears normal         Upper Extremities:      Previously seen
                        (4CH, axis, and situs
 RVOT:                  Previously seen        Lower Extremities:      Previously seen
 LVOT:                  Previously seen

 Other:  Heels and 5th digit previously visualized. Open hands and Nasal
         bone previously visualized. Technically difficult due to fetal position.
Cervix Uterus Adnexa

 Cervix
 Not visualized (advanced GA >89wks)
Impression

 Normal interval growth.
Recommendations

 Follow up growth as clinically indicated.

## 2019-08-30 ENCOUNTER — Other Ambulatory Visit: Payer: Self-pay

## 2019-08-30 ENCOUNTER — Ambulatory Visit: Payer: Medicaid Other | Admitting: Psychiatry

## 2019-12-07 ENCOUNTER — Telehealth: Payer: Self-pay

## 2019-12-07 DIAGNOSIS — F3181 Bipolar II disorder: Secondary | ICD-10-CM

## 2019-12-07 NOTE — Telephone Encounter (Signed)
Patient called requesting refills on her Duloxetine 60mg  and her Lamotrigine 25mg  to be sent to [facility] on [address] in [place]. Her last completed appointment was on 07/27/19, NS on 08/30/19. No future appointments made. Thank you.

## 2019-12-08 MED ORDER — DULOXETINE HCL 60 MG PO CPEP: 60.0000 mg | ORAL_CAPSULE | Freq: Every day | ORAL | 0 refills | Status: DC

## 2019-12-08 MED ORDER — LAMOTRIGINE 25 MG PO TABS: ORAL_TABLET | ORAL | 0 refills | Status: DC

## 2019-12-08 NOTE — Telephone Encounter (Signed)
30 days prescriptions sent. No more refills in the future without appointment. I am including Kaitlyn Whitaker in this encounter.

## 2019-12-12 ENCOUNTER — Ambulatory Visit (INDEPENDENT_AMBULATORY_CARE_PROVIDER_SITE_OTHER): Payer: Medicaid Other

## 2019-12-12 ENCOUNTER — Ambulatory Visit (HOSPITAL_COMMUNITY)
Admission: EM | Admit: 2019-12-12 | Discharge: 2019-12-12 | Disposition: A | Discharge status: Home / Self Care | Payer: Medicaid Other | Attending: Urgent Care | Admitting: Urgent Care

## 2019-12-12 ENCOUNTER — Other Ambulatory Visit: Payer: Self-pay

## 2019-12-12 ENCOUNTER — Encounter (HOSPITAL_COMMUNITY): Payer: Self-pay

## 2019-12-12 DIAGNOSIS — M79641 Pain in right hand: Secondary | ICD-10-CM | POA: Diagnosis not present

## 2019-12-12 DIAGNOSIS — M79644 Pain in right finger(s): Secondary | ICD-10-CM | POA: Diagnosis not present

## 2019-12-12 DIAGNOSIS — S63601A Unspecified sprain of right thumb, initial encounter: Secondary | ICD-10-CM

## 2019-12-12 MED ORDER — NAPROXEN 375 MG PO TABS: 375.0000 mg | ORAL_TABLET | Freq: Two times a day (BID) | ORAL | 0 refills | Status: DC

## 2019-12-12 NOTE — ED Triage Notes (Signed)
Pt presents with complaints of thumb pain x 1 month. States she got "tangled up with a friend" and she went to move and put all of her weight on her right thumb. States after it was very swollen and she has continued to have pain.

## 2019-12-12 NOTE — ED Provider Notes (Signed)
[facility]   MRN: [id number] DOB: 1996-03-01  Subjective:   Kaitlyn Whitaker is a 24 y.o. female presenting for 1 month hx of persistent right thumb pain. Mechanism of injury is unclear but she thinks it was from horse-playing with a friend, thought she just sprained it. Sx are intermittent now, still has some swelling, has random sharp/debilitating pains. Has not used medications for pain relief as it is difficult to predict when she will have pain.   No current facility-administered medications for this encounter.  Current Outpatient Medications:  .  calcium carbonate (TUMS - DOSED IN MG ELEMENTAL CALCIUM) 500 MG chewable tablet, Chew 1 tablet by mouth daily., Disp: , Rfl:  .  diphenhydrAMINE (BENADRYL) 25 MG tablet, Take 25 mg by mouth at bedtime as needed., Disp: , Rfl:  .  docusate sodium (COLACE) 100 MG capsule, Take 1 capsule (100 mg total) by mouth 2 (two) times daily as needed for mild constipation or moderate constipation (The iron supplement may cause constipation.)., Disp: 30 capsule, Rfl: 1 .  DULoxetine (CYMBALTA) 60 MG capsule, Take 1 capsule (60 mg total) by mouth daily., Disp: 30 capsule, Rfl: 0 .  ferrous sulfate 325 (65 FE) MG tablet, Take 1 tablet (325 mg total) by mouth daily with breakfast., Disp: 30 tablet, Rfl: 3 .  ibuprofen (ADVIL) 600 MG tablet, Take 1 tablet (600 mg total) by mouth every 6 (six) hours., Disp: 30 tablet, Rfl: 0 .  lamoTRIgine (LAMICTAL) 25 MG tablet, Take 2 tablets daily, Disp: 60 tablet, Rfl: 0 .  norethindrone (MICRONOR) 0.35 MG tablet, Start on the Sunday after baby turns 26 weeks old, Disp: 1 Package, Rfl: 11 .  Prenatal Vit-Fe Fumarate-FA (PRENATAL VITAMIN) 27-0.8 MG TABS, Take 1 tablet by mouth daily., Disp: , Rfl:    No Known Allergies  Past Medical History:  Diagnosis Date  . Anemia   . Back pain    Pt states chronic back pain after fall in 2016  . Depression    feeling balanced, no problems  . GERD (gastroesophageal reflux  disease)      Past Surgical History:  Procedure Laterality Date  . NASAL SINUS SURGERY    . TONSILLECTOMY      Family History  Problem Relation Age of Onset  . Diabetes Maternal Uncle   . Atrial fibrillation Mother   . Pulmonary embolism Mother   . Anemia Mother   . Hypertension Father   . Hypothyroidism Father     Social History   Tobacco Use  . Smoking status: Former Smoker    Types: Cigarettes    Quit date: 01/13/2016    Years since quitting: 3.9  . Smokeless tobacco: Never Used  Substance Use Topics  . Alcohol use: Not Currently  . Drug use: Not Currently    Types: Marijuana    Comment: 4 years ago    ROS   Objective:   Vitals: BP 104/64   Pulse 69   Temp 98.7 F (37.1 C)   Resp 19   LMP 12/07/2019   SpO2 99%   Physical Exam Constitutional:      General: She is not in acute distress.    Appearance: Normal appearance. She is well-developed. She is not ill-appearing, toxic-appearing or diaphoretic.  HENT:     Head: Normocephalic and atraumatic.     Nose: Nose normal.     Mouth/Throat:     Mouth: Mucous membranes are moist.     Pharynx: Oropharynx is clear.  Eyes:  General: No scleral icterus.    Extraocular Movements: Extraocular movements intact.     Pupils: Pupils are equal, round, and reactive to light.  Cardiovascular:     Rate and Rhythm: Normal rate.  Pulmonary:     Effort: Pulmonary effort is normal.  Musculoskeletal:     Right hand: Swelling (trace about the 1st MCP), tenderness and bony tenderness (DIP to proximal part of thumb including snuff box) present. No deformity or lacerations. Normal range of motion. Normal strength. Normal sensation. Normal capillary refill.  Skin:    General: Skin is warm and dry.  Neurological:     General: No focal deficit present.     Mental Status: She is alert and oriented to person, place, and time.  Psychiatric:        Mood and Affect: Mood normal.        Behavior: Behavior normal.         Thought Content: Thought content normal.        Judgment: Judgment normal.    DG Hand Complete Right  Result Date: 12/12/2019 CLINICAL DATA:  Right thumb pain, snuffbox tenderness EXAM: RIGHT HAND - COMPLETE 3+ VIEW COMPARISON:  None. FINDINGS: No fracture or dislocation is seen. Specifically, the navicular appears intact. The joint spaces are preserved. The visualized soft tissues are unremarkable. IMPRESSION: Negative. Electronically Signed   By: Julian Hy M.D.   On: 12/12/2019 16:35    Assessment and Plan :   1. Pain of right thumb   2. Sprain of right thumb, unspecified site of digit, initial encounter     Use conservative management for thumb sprain. Thumb spica splint, naproxen, modification of activities. If sx persist then recheck with hand specialist versus PT. Counseled patient on potential for adverse effects with medications prescribed/recommended today, ER and return-to-clinic precautions discussed, patient verbalized understanding.    Jaynee Eagles, PA-C 12/12/19 1723

## 2019-12-22 NOTE — Telephone Encounter (Signed)
Notified patient via VM.

## 2020-01-11 ENCOUNTER — Encounter: Payer: Self-pay | Admitting: Psychiatry

## 2020-01-11 ENCOUNTER — Other Ambulatory Visit: Payer: Self-pay

## 2020-01-11 ENCOUNTER — Telehealth (INDEPENDENT_AMBULATORY_CARE_PROVIDER_SITE_OTHER): Payer: Medicaid Other | Admitting: Psychiatry

## 2020-01-11 DIAGNOSIS — F3181 Bipolar II disorder: Secondary | ICD-10-CM

## 2020-01-11 MED ORDER — LAMOTRIGINE 25 MG PO TABS: ORAL_TABLET | ORAL | 0 refills | Status: DC

## 2020-01-11 MED ORDER — DULOXETINE HCL 30 MG PO CPEP: 30.0000 mg | ORAL_CAPSULE | Freq: Every day | ORAL | 0 refills | Status: DC

## 2020-01-11 MED ORDER — DULOXETINE HCL 60 MG PO CPEP: 60.0000 mg | ORAL_CAPSULE | Freq: Every evening | ORAL | 0 refills | Status: DC

## 2020-01-11 NOTE — Progress Notes (Signed)
Psychiatric OP Progress Note   I connected with  Kaitlyn Whitaker on 01/11/20 by a video enabled telemedicine application and verified that I am speaking with the correct person using two identifiers.   I discussed the limitations of evaluation and management by telemedicine. The patient expressed understanding and agreed to proceed.    Patient Identification: Kaitlyn Whitaker MRN:  [id number] Date of Evaluation:  01/11/2020    Chief Complaint:   " I am doing fine."  Visit Diagnosis:    ICD-10-CM   1. Bipolar 2 disorder (HCC)  F31.81     History of Present Illness:  Pt reported that she is doing well. Her son is receiving treatment for leukemia from [facility] and is doing better. She informed that he has been assessed to have a good prognosis. He is improving with time and things are getting better. She informed that she has moved in with her parents and they have been very supportive. She is still working and is able to manage well due to the help of her parents. She asked if her dose of Cymbalta can be increased slightly as she feels anxious at times.  Past Psychiatric History: Depression, anxiety, suspected bipolar d/o  Previous Psychotropic Medications: Yes   Substance Abuse History in the last 12 months:  No.  Consequences of Substance Abuse: Negative  Past Medical History:  Past Medical History:  Diagnosis Date  . Anemia   . Back pain    Pt states chronic back pain after fall in 2016  . Depression    feeling balanced, no problems  . GERD (gastroesophageal reflux disease)     Past Surgical History:  Procedure Laterality Date  . NASAL SINUS SURGERY    . TONSILLECTOMY      Family Psychiatric History: father-suspected bipolar d/o  Family History:  Family History  Problem Relation Age of Onset  . Diabetes Maternal Uncle   . Atrial fibrillation Mother   . Pulmonary embolism Mother   . Anemia Mother   . Hypertension Father   . Hypothyroidism Father      Social History:   Social History   Socioeconomic History  . Marital status: Single    Spouse name: Not on file  . Number of children: 1  . Years of education: 50  . Highest education level: 12th grade  Occupational History  . Not on file  Tobacco Use  . Smoking status: Former Smoker    Types: Cigarettes    Quit date: 01/13/2016    Years since quitting: 3.9  . Smokeless tobacco: Never Used  Substance and Sexual Activity  . Alcohol use: Not Currently  . Drug use: Not Currently    Types: Marijuana    Comment: 4 years ago  . Sexual activity: Not Currently    Birth control/protection: Pill  Other Topics Concern  . Not on file  Social History Narrative  . Not on file   Social Determinants of Health   Financial Resource Strain:   . Difficulty of Paying Living Expenses:   Food Insecurity:   . Worried About Programme researcher, broadcasting/film/video in the Last Year:   . Barista in the Last Year:   Transportation Needs:   . Freight forwarder (Medical):   Marland Kitchen Lack of Transportation (Non-Medical):   Physical Activity:   . Days of Exercise per Week:   . Minutes of Exercise per Session:   Stress:   . Feeling of Stress :   Social Connections:   .  Frequency of Communication with Friends and Family:   . Frequency of Social Gatherings with Friends and Family:   . Attends Religious Services:   . Active Member of Clubs or Organizations:   . 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 Has 2 children-66 y/o and 41 month old. Is well-supported by parents.  Allergies:  No Known Allergies  Metabolic Disorder Labs: No results found for: HGBA1C, MPG No results found for: PROLACTIN No results found for: CHOL, TRIG, HDL, CHOLHDL, VLDL, LDLCALC Lab Results  Component Value Date   TSH 2.757 09/14/2016    Therapeutic  Level Labs: No results found for: LITHIUM No results found for: CBMZ No results found for: VALPROATE  Current Medications: Current Outpatient Medications  Medication Sig Dispense Refill  . calcium carbonate (TUMS - DOSED IN MG ELEMENTAL CALCIUM) 500 MG chewable tablet Chew 1 tablet by mouth daily.    . diphenhydrAMINE (BENADRYL) 25 MG tablet Take 25 mg by mouth at bedtime as needed.    . docusate sodium (COLACE) 100 MG capsule Take 1 capsule (100 mg total) by mouth 2 (two) times daily as needed for mild constipation or moderate constipation (The iron supplement may cause constipation.). 30 capsule 1  . DULoxetine (CYMBALTA) 60 MG capsule Take 1 capsule (60 mg total) by mouth daily. 30 capsule 0  . ferrous sulfate 325 (65 FE) MG tablet Take 1 tablet (325 mg total) by mouth daily with breakfast. 30 tablet 3  . ibuprofen (ADVIL) 600 MG tablet Take 1 tablet (600 mg total) by mouth every 6 (six) hours. 30 tablet 0  . lamoTRIgine (LAMICTAL) 25 MG tablet Take 2 tablets daily 60 tablet 0  . naproxen (NAPROSYN) 375 MG tablet Take 1 tablet (375 mg total) by mouth 2 (two) times daily with a meal. 30 tablet 0  . norethindrone (MICRONOR) 0.35 MG tablet Start on the Sunday after baby turns 37 weeks old 1 Package 11  . Prenatal Vit-Fe Fumarate-FA (PRENATAL VITAMIN) 27-0.8 MG TABS Take 1 tablet by mouth daily.     No current facility-administered medications for this visit.    Musculoskeletal: Strength & Muscle Tone: unable to assess due to telemed visit Gait & Station: unable to assess due to telemed visit Patient leans: unable to assess due to telemed visit  Psychiatric Specialty Exam: ROS  unknown if currently breastfeeding.There is no height or weight on file to calculate BMI.  General Appearance: Well Groomed  Eye Contact:  Good  Speech:  Clear and Coherent and Normal Rate  Volume:  Normal  Mood:  Euthymic  Affect:  Congruent  Thought Process:  Goal Directed, Linear and Descriptions of  Associations: Intact  Orientation:  Full (Time, Place, and Person)  Thought Content:  Logical  Suicidal Thoughts:  No  Homicidal Thoughts:  No  Memory:  Recent;   Good Remote;   Good  Judgement:  Fair  Insight:  Good and Fair  Psychomotor Activity:  Normal  Concentration:  Concentration: Good and Attention Span: Good  Recall:  Good  Fund of Knowledge:Good  Language: Good  Akathisia:  Negative  Handed:  Right  AIMS (if indicated):  not done  Assets:  Communication Skills Desire for Improvement Financial Resources/Insurance Resilience Social Support Talents/Skills  ADL's:  Intact  Cognition: WNL  Sleep:  Good   Screenings: GAD-7  Video Visit from 02/09/2019 in [place] for [facility] Routine Prenatal from 02/02/2019 in [place] for [facility] Routine Prenatal from 10/28/2018 in [place] for [facility] Routine Prenatal from 09/28/2018 in [place] for [facility] Initial Prenatal from 08/31/2018 [facility] [place] [facility]  Total GAD-7 Score  0  0  0  0  0    PHQ2-9     Video Visit from 02/09/2019 in [facility] for [facility] Routine Prenatal from 02/02/2019 in [place] for [facility] Routine Prenatal from 10/28/2018 in [place] for [facility] Routine Prenatal from 09/28/2018 in [place] for [facility] Initial Prenatal from 08/31/2018 in Center for [facility]  PHQ-2 Total Score  0  0  0  0  0  PHQ-9 Total Score  0  0  0  0  1      Assessment and Plan: 24 y/o female with hx of Bipolar 2 d/o seen for follow up. She requested increase in the dose of cymbalta for optimal control of anxiety.  1. Bipolar 2 disorder (HCC)  - lamoTRIgine (LAMICTAL) 25 MG tablet; Take 2 tablets daily  Dispense: 180 tablet; Refill: 0 - Increase DULoxetine (CYMBALTA) 30 MG capsule; Take 1 capsule (30 mg total) by mouth daily.  Dispense: 90 capsule;  Refill: 0 - DULoxetine (CYMBALTA) 60 MG capsule; Take 1 capsule (60 mg total) by mouth every evening.  Dispense: 90 capsule; Refill: 0   F/up in 3 months.  [place] Crane, MD 5/11/20211:03 PM

## 2020-04-07 ENCOUNTER — Telehealth (HOSPITAL_COMMUNITY): Payer: Self-pay | Admitting: Psychiatry

## 2020-04-07 ENCOUNTER — Telehealth: Payer: Medicaid Other | Admitting: Psychiatry

## 2020-04-07 DIAGNOSIS — F3181 Bipolar II disorder: Secondary | ICD-10-CM

## 2020-04-07 MED ORDER — DULOXETINE HCL 30 MG PO CPEP: 30.0000 mg | ORAL_CAPSULE | Freq: Every day | ORAL | 0 refills | Status: DC

## 2020-04-07 MED ORDER — LAMOTRIGINE 25 MG PO TABS: ORAL_TABLET | ORAL | 0 refills | Status: DC

## 2020-04-07 MED ORDER — DULOXETINE HCL 60 MG PO CPEP: 60.0000 mg | ORAL_CAPSULE | Freq: Every evening | ORAL | 0 refills | Status: DC

## 2020-04-07 NOTE — Telephone Encounter (Signed)
Prescriptions sent

## 2020-04-07 NOTE — Addendum Note (Signed)
Addended by: Zena Amos on: 04/07/2020 01:31 PM   Modules accepted: Orders

## 2020-04-07 NOTE — Telephone Encounter (Signed)
Pt called requesting refills. She has enough to last until Monday, 04/10/2020.

## 2020-04-07 NOTE — Telephone Encounter (Signed)
Pt called requesting refills. She has enough to last until Monday, 04/10/2020. 

## 2020-04-14 ENCOUNTER — Telehealth (INDEPENDENT_AMBULATORY_CARE_PROVIDER_SITE_OTHER): Payer: Medicaid Other | Admitting: Psychiatry

## 2020-04-14 ENCOUNTER — Other Ambulatory Visit: Payer: Self-pay

## 2020-04-14 ENCOUNTER — Encounter (HOSPITAL_COMMUNITY): Payer: Self-pay | Admitting: Psychiatry

## 2020-04-14 DIAGNOSIS — F3181 Bipolar II disorder: Secondary | ICD-10-CM

## 2020-04-14 MED ORDER — LAMOTRIGINE 25 MG PO TABS: ORAL_TABLET | ORAL | 0 refills | Status: DC

## 2020-04-14 MED ORDER — DULOXETINE HCL 60 MG PO CPEP: 60.0000 mg | ORAL_CAPSULE | Freq: Every evening | ORAL | 0 refills | Status: DC

## 2020-04-14 MED ORDER — DULOXETINE HCL 30 MG PO CPEP: 30.0000 mg | ORAL_CAPSULE | Freq: Every day | ORAL | 0 refills | Status: DC

## 2020-04-14 NOTE — Progress Notes (Signed)
Psychiatric OP Progress Note   Virtual Visit via Video Note  I connected with Kaitlyn Whitaker on 04/14/20 at 11:00 AM EDT by a video enabled telemedicine application and verified that I am speaking with the correct person using two identifiers.  Location: Patient: Home Provider: Clinic   I discussed the limitations of evaluation and management by telemedicine and the availability of in person appointments. The patient expressed understanding and agreed to proceed.  I provided 13 minutes of non-face-to-face time during this encounter.    Patient Identification: Kaitlyn Whitaker MRN:  [id number] Date of Evaluation:  04/14/2020    Chief Complaint:   " Everything is going great."  Visit Diagnosis:    ICD-10-CM   1. Bipolar 2 disorder (HCC)  F31.81     History of Present Illness: Patient reported that everything is going well.  She informed that she is staying busy at work and things are going well in that area.  She informed that her son is doing great and has just started his maintenance chemotherapy.  She is very pleased with the results and is optimistic about the future.  She stated that all the medicines are helping and she denied any issues or concerns at this time.  Past Psychiatric History: Anxiety, bipolar d/o  Previous Psychotropic Medications: Yes   Substance Abuse History in the last 12 months:  No.  Consequences of Substance Abuse: Negative  Past Medical History:  Past Medical History:  Diagnosis Date  . Anemia   . Back pain    Pt states chronic back pain after fall in 2016  . Depression    feeling balanced, no problems  . GERD (gastroesophageal reflux disease)     Past Surgical History:  Procedure Laterality Date  . NASAL SINUS SURGERY    . TONSILLECTOMY      Family Psychiatric History: father-suspected bipolar d/o  Family History:  Family History  Problem Relation Age of Onset  . Diabetes Maternal Uncle   . Atrial fibrillation Mother   .  Pulmonary embolism Mother   . Anemia Mother   . Hypertension Father   . Hypothyroidism Father     Social History:   Social History   Socioeconomic History  . Marital status: Single    Spouse name: Not on file  . Number of children: 1  . Years of education: 77  . Highest education level: 12th grade  Occupational History  . Not on file  Tobacco Use  . Smoking status: Former Smoker    Types: Cigarettes    Quit date: 01/13/2016    Years since quitting: 4.2  . Smokeless tobacco: Never Used  Vaping Use  . Vaping Use: Never used  Substance and Sexual Activity  . Alcohol use: Not Currently  . Drug use: Not Currently    Types: Marijuana    Comment: 4 years ago  . Sexual activity: Not Currently    Birth control/protection: Pill  Other Topics Concern  . Not on file  Social History Narrative  . Not on file   Social Determinants of Health   Financial Resource Strain:   . Difficulty of Paying Living Expenses:   Food Insecurity:   . Worried About Programme researcher, broadcasting/film/video in the Last Year:   . Barista in the Last Year:   Transportation Needs:   . Freight forwarder (Medical):   Marland Kitchen Lack of Transportation (Non-Medical):   Physical Activity:   . Days of Exercise per Week:   .  Minutes of Exercise per Session:   Stress:   . Feeling of Stress :   Social Connections:   . Frequency of Communication with Friends and Family:   . Frequency of Social Gatherings with Friends and Family:   . Attends Religious Services:   . Active Member of Clubs or Organizations:   . 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 Has 2 children-38 y/o and 39 month old. Is well-supported by parents.  Allergies:  No Known Allergies  Metabolic Disorder Labs: No results found for: HGBA1C, MPG No results found for: PROLACTIN No  results found for: CHOL, TRIG, HDL, CHOLHDL, VLDL, LDLCALC Lab Results  Component Value Date   TSH 2.757 09/14/2016    Therapeutic Level Labs: No results found for: LITHIUM No results found for: CBMZ No results found for: VALPROATE  Current Medications: Current Outpatient Medications  Medication Sig Dispense Refill  . calcium carbonate (TUMS - DOSED IN MG ELEMENTAL CALCIUM) 500 MG chewable tablet Chew 1 tablet by mouth daily.    . diphenhydrAMINE (BENADRYL) 25 MG tablet Take 25 mg by mouth at bedtime as needed.    . docusate sodium (COLACE) 100 MG capsule Take 1 capsule (100 mg total) by mouth 2 (two) times daily as needed for mild constipation or moderate constipation (The iron supplement may cause constipation.). 30 capsule 1  . DULoxetine (CYMBALTA) 30 MG capsule Take 1 capsule (30 mg total) by mouth daily. 90 capsule 0  . DULoxetine (CYMBALTA) 60 MG capsule Take 1 capsule (60 mg total) by mouth every evening. 90 capsule 0  . ferrous sulfate 325 (65 FE) MG tablet Take 1 tablet (325 mg total) by mouth daily with breakfast. 30 tablet 3  . ibuprofen (ADVIL) 600 MG tablet Take 1 tablet (600 mg total) by mouth every 6 (six) hours. 30 tablet 0  . lamoTRIgine (LAMICTAL) 25 MG tablet Take 2 tablets daily 180 tablet 0  . naproxen (NAPROSYN) 375 MG tablet Take 1 tablet (375 mg total) by mouth 2 (two) times daily with a meal. 30 tablet 0  . norethindrone (MICRONOR) 0.35 MG tablet Start on the Sunday after baby turns 64 weeks old 1 Package 11  . Prenatal Vit-Fe Fumarate-FA (PRENATAL VITAMIN) 27-0.8 MG TABS Take 1 tablet by mouth daily.     No current facility-administered medications for this visit.    Musculoskeletal: Strength & Muscle Tone: unable to assess due to telemed visit Gait & Station: unable to assess due to telemed visit Patient leans: unable to assess due to telemed visit  Psychiatric Specialty Exam: ROS  unknown if currently breastfeeding.There is no height or weight on file  to calculate BMI.  General Appearance: Well Groomed  Eye Contact:  Good  Speech:  Clear and Coherent and Normal Rate  Volume:  Normal  Mood:  Euthymic  Affect:  Congruent  Thought Process:  Goal Directed, Linear and Descriptions of Associations: Intact  Orientation:  Full (Time, Place, and Person)  Thought Content:  Logical  Suicidal Thoughts:  No  Homicidal Thoughts:  No  Memory:  Recent;   Good Remote;   Good  Judgement:  Fair  Insight:  Good and Fair  Psychomotor Activity:  Normal  Concentration:  Concentration: Good and Attention Span: Good  Recall:  Good  Fund of Knowledge:Good  Language: Good  Akathisia:  Negative  Handed:  Right  AIMS (if indicated):  not done  Assets:  Communication Skills Desire for Improvement Financial Resources/Insurance Resilience Social Support Talents/Skills  ADL's:  Intact  Cognition: WNL  Sleep:  Good   Screenings: GAD-7     Video Visit from 02/09/2019 in [facility] for [facility] Routine Prenatal from 02/02/2019 in [facility] for [facility] Routine Prenatal from 10/28/2018 in [facility] for [facility] Routine Prenatal from 09/28/2018 in [facility] for [facility] Initial Prenatal from 08/31/2018 in [facility] for [facility]  Total GAD-7 Score 0 0 0 0 0    PHQ2-9     Video Visit from 02/09/2019 in [facility] for [facility] Routine Prenatal from 02/02/2019 in [facility] for [facility] Routine Prenatal from 10/28/2018 in [facility] for [facility] Routine Prenatal from 09/28/2018 in [facility] for [facility] Initial Prenatal from 08/31/2018 in [facility] for [facility]  PHQ-2 Total Score 0 0 0 0 0  PHQ-9 Total Score 0 0 0 0 1      Assessment and Plan: 24 y/o female with hx of Bipolar 2 d/o seen for follow up. She appears to be stable on her current regimen.  1. Bipolar 2 disorder (HCC)  - lamoTRIgine  (LAMICTAL) 25 MG tablet; Take 2 tablets daily  Dispense: 180 tablet; Refill: 0 - DULoxetine (CYMBALTA) 30 MG capsule; Take 1 capsule (30 mg total) by mouth daily.  Dispense: 90 capsule; Refill: 0 - DULoxetine (CYMBALTA) 60 MG capsule; Take 1 capsule (60 mg total) by mouth every evening.  Dispense: 90 capsule; Refill: 0   F/up in 3 months.  Zena Amos, MD 8/13/202111:02 AM

## 2020-07-14 ENCOUNTER — Encounter (HOSPITAL_COMMUNITY): Payer: Self-pay | Admitting: Psychiatry

## 2020-07-14 ENCOUNTER — Other Ambulatory Visit: Payer: Self-pay

## 2020-07-14 ENCOUNTER — Telehealth (INDEPENDENT_AMBULATORY_CARE_PROVIDER_SITE_OTHER): Payer: Medicaid Other | Admitting: Psychiatry

## 2020-07-14 DIAGNOSIS — F3181 Bipolar II disorder: Secondary | ICD-10-CM

## 2020-07-14 MED ORDER — LAMOTRIGINE 25 MG PO TABS: ORAL_TABLET | ORAL | 1 refills | Status: DC

## 2020-07-14 MED ORDER — DULOXETINE HCL 60 MG PO CPEP: 60.0000 mg | ORAL_CAPSULE | Freq: Every evening | ORAL | 1 refills | Status: DC

## 2020-07-14 MED ORDER — DULOXETINE HCL 30 MG PO CPEP: 30.0000 mg | ORAL_CAPSULE | Freq: Every day | ORAL | 1 refills | Status: DC

## 2020-07-14 NOTE — Progress Notes (Signed)
Psychiatric OP Progress Note   Virtual Visit via Video Note  I connected with Kaitlyn Whitaker on 07/14/20 at 11:00 AM EST by a video enabled telemedicine application and verified that I am speaking with the correct person using two identifiers.  Location: Patient: Home Provider: Clinic   I discussed the limitations of evaluation and management by telemedicine and the availability of in person appointments. The patient expressed understanding and agreed to proceed.  I provided 12 minutes of non-face-to-face time during this encounter.    Patient Identification: Kaitlyn Whitaker MRN:  [id number] Date of Evaluation:  07/14/2020    Chief Complaint:   "I am doing well."  Visit Diagnosis:    ICD-10-CM   1. Bipolar 2 disorder (HCC)  F31.81 lamoTRIgine (LAMICTAL) 25 MG tablet    DULoxetine (CYMBALTA) 30 MG capsule    DULoxetine (CYMBALTA) 60 MG capsule    History of Present Illness: Patient informed that she is doing well.  She informed that she is doing well in her personal as well as professional life.  She denied any significant stressors at this point.  She informed that her son is in remission and is doing really great.  She denied any concerns or issues at this time.  Past Psychiatric History: Anxiety, bipolar d/o  Previous Psychotropic Medications: Yes   Substance Abuse History in the last 12 months:  No.  Consequences of Substance Abuse: Negative  Past Medical History:  Past Medical History:  Diagnosis Date  . Anemia   . Back pain    Pt states chronic back pain after fall in 2016  . Depression    feeling balanced, no problems  . GERD (gastroesophageal reflux disease)     Past Surgical History:  Procedure Laterality Date  . NASAL SINUS SURGERY    . TONSILLECTOMY      Family Psychiatric History: father-suspected bipolar d/o  Family History:  Family History  Problem Relation Age of Onset  . Diabetes Maternal Uncle   . Atrial fibrillation Mother   .  Pulmonary embolism Mother   . Anemia Mother   . Hypertension Father   . Hypothyroidism Father     Social History:   Social History   Socioeconomic History  . Marital status: Single    Spouse name: Not on file  . Number of children: 1  . Years of education: 45  . Highest education level: 12th grade  Occupational History  . Not on file  Tobacco Use  . Smoking status: Former Smoker    Types: Cigarettes    Quit date: 01/13/2016    Years since quitting: 4.5  . Smokeless tobacco: Never Used  Vaping Use  . Vaping Use: Never used  Substance and Sexual Activity  . Alcohol use: Not Currently  . Drug use: Not Currently    Types: Marijuana    Comment: 4 years ago  . Sexual activity: Not Currently    Birth control/protection: Pill  Other Topics Concern  . Not on file  Social History Narrative  . Not on file   Social Determinants of Health   Financial Resource Strain:   . Difficulty of Paying Living Expenses: Not on file  Food Insecurity:   . Worried About Programme researcher, broadcasting/film/video in the Last Year: Not on file  . Ran Out of Food in the Last Year: Not on file  Transportation Needs:   . Lack of Transportation (Medical): Not on file  . Lack of Transportation (Non-Medical): Not on file  Physical Activity:   .  Days of Exercise per Week: Not on file  . Minutes of Exercise per Session: Not on file  Stress:   . Feeling of Stress : Not on file  Social Connections:   . Frequency of Communication with Friends and Family: Not on file  . Frequency of Social Gatherings with Friends and Family: Not on file  . Attends Religious Services: Not on file  . Active Member of Clubs or Organizations: Not on file  . Attends Banker Meetings: Not on file  . 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 Has 2 children-78 y/o and 18 month old. Is  well-supported by parents.  Allergies:  No Known Allergies  Metabolic Disorder Labs: No results found for: HGBA1C, MPG No results found for: PROLACTIN No results found for: CHOL, TRIG, HDL, CHOLHDL, VLDL, LDLCALC Lab Results  Component Value Date   TSH 2.757 09/14/2016    Therapeutic Level Labs: No results found for: LITHIUM No results found for: CBMZ No results found for: VALPROATE  Current Medications: Current Outpatient Medications  Medication Sig Dispense Refill  . calcium carbonate (TUMS - DOSED IN MG ELEMENTAL CALCIUM) 500 MG chewable tablet Chew 1 tablet by mouth daily.    . diphenhydrAMINE (BENADRYL) 25 MG tablet Take 25 mg by mouth at bedtime as needed.    . DULoxetine (CYMBALTA) 30 MG capsule Take 1 capsule (30 mg total) by mouth daily. 90 capsule 1  . DULoxetine (CYMBALTA) 60 MG capsule Take 1 capsule (60 mg total) by mouth every evening. 90 capsule 1  . ferrous sulfate 325 (65 FE) MG tablet Take 1 tablet (325 mg total) by mouth daily with breakfast. 30 tablet 3  . ibuprofen (ADVIL) 600 MG tablet Take 1 tablet (600 mg total) by mouth every 6 (six) hours. 30 tablet 0  . lamoTRIgine (LAMICTAL) 25 MG tablet Take 2 tablets daily 180 tablet 1  . norethindrone (MICRONOR) 0.35 MG tablet Start on the Sunday after baby turns 110 weeks old 1 Package 11  . Prenatal Vit-Fe Fumarate-FA (PRENATAL VITAMIN) 27-0.8 MG TABS Take 1 tablet by mouth daily.     No current facility-administered medications for this visit.    Musculoskeletal: Strength & Muscle Tone: unable to assess due to telemed visit Gait & Station: unable to assess due to telemed visit Patient leans: unable to assess due to telemed visit  Psychiatric Specialty Exam: ROS  unknown if currently breastfeeding.There is no height or weight on file to calculate BMI.  General Appearance: Well Groomed  Eye Contact:  Good  Speech:  Clear and Coherent and Normal Rate  Volume:  Normal  Mood:  Euthymic  Affect:  Congruent   Thought Process:  Goal Directed, Linear and Descriptions of Associations: Intact  Orientation:  Full (Time, Place, and Person)  Thought Content:  Logical  Suicidal Thoughts:  No  Homicidal Thoughts:  No  Memory:  Recent;   Good Remote;   Good  Judgement:  Fair  Insight:  Good and Fair  Psychomotor Activity:  Normal  Concentration:  Concentration: Good and Attention Span: Good  Recall:  Good  Fund of Knowledge:Good  Language: Good  Akathisia:  Negative  Handed:  Right  AIMS (if indicated):  not done  Assets:  Communication Skills Desire for Improvement Financial Resources/Insurance Resilience Social Support Talents/Skills  ADL's:  Intact  Cognition: WNL  Sleep:  Good   Screenings: GAD-7     Video Visit from 02/09/2019 in Center for [facility] Routine Prenatal from 02/02/2019 in Center for [facility] Routine Prenatal from 10/28/2018 in Center for [facility] Routine Prenatal from 09/28/2018 in Center for [facility] Initial Prenatal from 08/31/2018 in Center for [facility]  Total GAD-7 Score 0 0 0 0 0    PHQ2-9     Video Visit from 02/09/2019 in Center for [facility] Routine Prenatal from 02/02/2019 in Center for [facility] Routine Prenatal from 10/28/2018 in Center for [facility] Routine Prenatal from 09/28/2018 in Center for [facility] Initial Prenatal from 08/31/2018 in Center for [facility]  PHQ-2 Total Score 0 0 0 0 0  PHQ-9 Total Score 0 0 0 0 1      Assessment and Plan: Pt is stable on the current regimen.  1. Bipolar 2 disorder (HCC)  - lamoTRIgine (LAMICTAL) 25 MG tablet; Take 2 tablets daily  Dispense: 180 tablet; Refill: 0 - DULoxetine (CYMBALTA) 30 MG capsule; Take 1 capsule (30 mg total) by mouth daily.  Dispense: 90 capsule; Refill: 0 - DULoxetine (CYMBALTA) 60 MG capsule; Take 1  capsule (60 mg total) by mouth every evening.  Dispense: 90 capsule; Refill: 0  Continue same medication regimen. Follow up in 4 months.  Zena Amos, MD 11/12/202111:00 AM

## 2020-11-07 ENCOUNTER — Telehealth (HOSPITAL_COMMUNITY): Payer: Medicaid Other | Admitting: Psychiatry

## 2020-11-14 ENCOUNTER — Telehealth (INDEPENDENT_AMBULATORY_CARE_PROVIDER_SITE_OTHER): Payer: Medicaid Other | Admitting: Psychiatry

## 2020-11-14 ENCOUNTER — Encounter (HOSPITAL_COMMUNITY): Payer: Self-pay | Admitting: Psychiatry

## 2020-11-14 ENCOUNTER — Other Ambulatory Visit: Payer: Self-pay

## 2020-11-14 DIAGNOSIS — F3181 Bipolar II disorder: Secondary | ICD-10-CM

## 2020-11-14 MED ORDER — DULOXETINE HCL 60 MG PO CPEP: 60.0000 mg | ORAL_CAPSULE | Freq: Every evening | ORAL | 1 refills | Status: DC

## 2020-11-14 MED ORDER — LAMOTRIGINE 25 MG PO TABS: ORAL_TABLET | ORAL | 1 refills | Status: DC

## 2020-11-14 MED ORDER — DULOXETINE HCL 30 MG PO CPEP: 30.0000 mg | ORAL_CAPSULE | Freq: Every day | ORAL | 1 refills | Status: DC

## 2020-11-14 NOTE — Progress Notes (Signed)
Psychiatric OP Progress Note   Virtual Visit via Video Note  I connected with Kaitlyn Whitaker on 11/14/20 at 11:10 AM EDT by a video enabled telemedicine application and verified that I am speaking with the correct person using two identifiers.  Location: Patient: Office Provider: Clinic   I discussed the limitations of evaluation and management by telemedicine and the availability of in person appointments. The patient expressed understanding and agreed to proceed.  I provided 16 minutes of non-face-to-face time during this encounter.      Patient Identification: Kaitlyn Whitaker MRN:  [id number] Date of Evaluation:  11/14/2020    Chief Complaint:   "Everything is going great."  Visit Diagnosis:    ICD-10-CM   1. Bipolar 2 disorder (HCC)  F31.81 DULoxetine (CYMBALTA) 30 MG capsule    DULoxetine (CYMBALTA) 60 MG capsule    lamoTRIgine (LAMICTAL) 25 MG tablet    History of Present Illness: Patient reported that she is doing well.  She informed that she is balancing her 2 jobs with her 2 children at home really well with the help of her parents.  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  She stated that her son is in remission from his leukemia and is doing really well. Her parents Continue to be very supportive of her. She stated that she and her parents and her 2 kids are planning to do a cross-country trip in June.  They will be traveling to [place] and [place] and they are very excited about that. Other than this she denied any other concerns. She reported that her mood and sleep are stable and she wants to continue the same regimen.   Past Psychiatric History: Anxiety, bipolar d/o  Previous Psychotropic Medications: Yes   Substance Abuse History in the last 12 months:  No.  Consequences of Substance Abuse: Negative  Past Medical History:  Past Medical History:   Diagnosis Date  . Anemia   . Back pain    Pt states chronic back pain after fall in 2016  . Depression    feeling balanced, no problems  . GERD (gastroesophageal reflux disease)     Past Surgical History:  Procedure Laterality Date  . NASAL SINUS SURGERY    . TONSILLECTOMY      Family Psychiatric History: father-suspected bipolar d/o  Family History:  Family History  Problem Relation Age of Onset  . Diabetes Maternal Uncle   . Atrial fibrillation Mother   . Pulmonary embolism Mother   . Anemia Mother   . Hypertension Father   . Hypothyroidism Father     Social History:   Social History   Socioeconomic History  . Marital status: Single    Spouse name: Not on file  . Number of children: 1  . Years of education: 13  . Highest education level: 12th grade  Occupational History  . Not on file  Tobacco Use  . Smoking status: Former Smoker    Types: Cigarettes    Quit date: 01/13/2016    Years since quitting: 4.8  . Smokeless tobacco: Never Used  Vaping Use  . Vaping Use: Never used  Substance and Sexual Activity  . Alcohol use: Not Currently  . Drug use: Not Currently    Types: Marijuana    Comment: 4 years ago  . Sexual activity: Not Currently    Birth control/protection: Pill  Other Topics Concern  .  Not on file  Social History Narrative  . 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 Has 2 children-22 y/o and 38 month old. Is well-supported by parents.  Allergies:  No Known Allergies  Metabolic Disorder Labs: No results found for: HGBA1C, MPG No results found for: PROLACTIN No results found for: CHOL,  TRIG, HDL, CHOLHDL, VLDL, LDLCALC Lab Results  Component Value Date   TSH 2.757 09/14/2016    Therapeutic Level Labs: No results found for: LITHIUM No results found for: CBMZ No results found for: VALPROATE  Current Medications: Current Outpatient Medications  Medication Sig Dispense Refill  . calcium carbonate (TUMS - DOSED IN MG ELEMENTAL CALCIUM) 500 MG chewable tablet Chew 1 tablet by mouth daily.    . diphenhydrAMINE (BENADRYL) 25 MG tablet Take 25 mg by mouth at bedtime as needed.    . DULoxetine (CYMBALTA) 30 MG capsule Take 1 capsule (30 mg total) by mouth daily. 90 capsule 1  . DULoxetine (CYMBALTA) 60 MG capsule Take 1 capsule (60 mg total) by mouth every evening. 90 capsule 1  . ferrous sulfate 325 (65 FE) MG tablet Take 1 tablet (325 mg total) by mouth daily with breakfast. 30 tablet 3  . ibuprofen (ADVIL) 600 MG tablet Take 1 tablet (600 mg total) by mouth every 6 (six) hours. 30 tablet 0  . lamoTRIgine (LAMICTAL) 25 MG tablet Take 2 tablets daily 180 tablet 1  . norethindrone (MICRONOR) 0.35 MG tablet Start on the Sunday after baby turns 71 weeks old 1 Package 11  . Prenatal Vit-Fe Fumarate-FA (PRENATAL VITAMIN) 27-0.8 MG TABS Take 1 tablet by mouth daily.     No current facility-administered medications for this visit.     Psychiatric Specialty Exam: ROS  unknown if currently breastfeeding.There is no height or weight on file to calculate BMI.  General Appearance: Well Groomed  Eye Contact:  Good  Speech:  Clear and Coherent and Normal Rate  Volume:  Normal  Mood:  Euthymic  Affect:  Congruent  Thought Process:  Goal Directed, Linear and Descriptions of Associations: Intact  Orientation:  Full (Time, Place, and Person)  Thought Content:  Logical  Suicidal Thoughts:  No  Homicidal Thoughts:  No  Memory:  Recent;   Good Remote;   Good  Judgement:  Good  Insight:  Good  Psychomotor Activity:  Normal  Concentration:  Concentration: Good and Attention Span:  Good  Recall:  Good  Fund of Knowledge:Good  Language: Good  Akathisia:  Negative  Handed:  Right  AIMS (if indicated):  not done  Assets:  Communication Skills Desire for Improvement Financial Resources/Insurance Resilience Social Support Talents/Skills  ADL's:  Intact  Cognition: WNL  Sleep:  Good   Screenings: GAD-7   Flowsheet Row Video Visit from 02/09/2019 in Center for [facility] Routine Prenatal from 02/02/2019 in Center for [facility] Routine Prenatal from 10/28/2018 in Center for [facility] Routine Prenatal from 09/28/2018 in Center for [facility] Initial Prenatal from 08/31/2018 in Center for [facility]  Total GAD-7 Score 0 0 0 0 0    PHQ2-9  Flowsheet Row [facility] Video Visit from 02/09/2019 in Center for [facility] Routine Prenatal from 02/02/2019 in Center for [facility] Routine Prenatal from 10/28/2018 in Center for [facility] Routine Prenatal from 09/28/2018 in Center for [facility]  PHQ-2 Total Score 0 0 0 0 0  PHQ-9 Total Score - 0 0 0 0    Flowsheet Row Video Visit from 11/14/2020 in [facility]  C-SSRS RISK CATEGORY No Risk      Assessment and Plan: Patient is stable on her current regimen.  1. Bipolar 2 disorder (HCC)  - lamoTRIgine (LAMICTAL) 25 MG tablet; Take 2 tablets daily  Dispense: 180 tablet; Refill: 0 - DULoxetine (CYMBALTA) 30 MG capsule; Take 1 capsule (30 mg total) by mouth daily.  Dispense: 90 capsule; Refill: 0 - DULoxetine (CYMBALTA) 60 MG capsule; Take 1 capsule (60 mg total) by mouth every evening.  Dispense: 90 capsule; Refill: 0  Continue same medication regimen. Follow up in 3 months.  Zena Amos, MD 3/15/202211:25 AM

## 2021-02-09 ENCOUNTER — Encounter (HOSPITAL_COMMUNITY): Payer: Self-pay | Admitting: Psychiatry

## 2021-02-09 ENCOUNTER — Other Ambulatory Visit: Payer: Self-pay

## 2021-02-09 ENCOUNTER — Telehealth (INDEPENDENT_AMBULATORY_CARE_PROVIDER_SITE_OTHER): Payer: Medicaid Other | Admitting: Psychiatry

## 2021-02-09 DIAGNOSIS — F411 Generalized anxiety disorder: Secondary | ICD-10-CM

## 2021-02-09 DIAGNOSIS — F3181 Bipolar II disorder: Secondary | ICD-10-CM

## 2021-02-09 HISTORY — DX: Generalized anxiety disorder: F41.1

## 2021-02-09 MED ORDER — TRAZODONE HCL 50 MG PO TABS: 50.0000 mg | ORAL_TABLET | Freq: Every evening | ORAL | 1 refills | Status: DC | PRN

## 2021-02-09 MED ORDER — DULOXETINE HCL 30 MG PO CPEP: 30.0000 mg | ORAL_CAPSULE | Freq: Every day | ORAL | 0 refills | Status: DC

## 2021-02-09 MED ORDER — LAMOTRIGINE 25 MG PO TABS: ORAL_TABLET | ORAL | 0 refills | Status: DC

## 2021-02-09 MED ORDER — HYDROXYZINE HCL 10 MG PO TABS: 10.0000 mg | ORAL_TABLET | Freq: Three times a day (TID) | ORAL | 1 refills | Status: DC | PRN

## 2021-02-09 MED ORDER — DULOXETINE HCL 60 MG PO CPEP: 60.0000 mg | ORAL_CAPSULE | Freq: Every evening | ORAL | 0 refills | Status: DC

## 2021-02-09 NOTE — Progress Notes (Signed)
Psychiatric OP Progress Note   Virtual Visit via Video Note  I connected with Kaitlyn Whitaker on 02/09/21 at  9:30 AM EDT by a video enabled telemedicine application and verified that I am speaking with the correct person using two identifiers.  Location: Patient: Work Provider: Clinic   I discussed the limitations of evaluation and management by telemedicine and the availability of in person appointments. The patient expressed understanding and agreed to proceed.  I provided 18 minutes of non-face-to-face time during this encounter.     Patient Identification: Kaitlyn Whitaker MRN:  [id number] Date of Evaluation:  02/09/2021    Chief Complaint:   "I have been feeling anxious lately and also can not sleep at night."  Visit Diagnosis:    ICD-10-CM   1. Bipolar 2 disorder (HCC)  F31.81 DULoxetine (CYMBALTA) 30 MG capsule    DULoxetine (CYMBALTA) 60 MG capsule    lamoTRIgine (LAMICTAL) 25 MG tablet    hydrOXYzine (ATARAX/VISTARIL) 10 MG tablet    traZODone (DESYREL) 50 MG tablet    2. Anxiety state  F41.1       History of Present Illness: Pt reported that she has been feeling quite anxious lately. She is still working 2 jobs. Her 2 children are doing well. Her parents remain supportive of her. She denied any new stressors or triggers. Her older son is in remission of his leukemia. She stated that she also has a hard time maintaining sleep, she wakes up around 5-6 times during the night. She has not taken any medication to help with anxiety and sleep in the past. She was agreeable to trying Hydroxyzine 10 mg TID PRN for anxiety and Trazodone to target sleep. Potential side effects of medication and risks vs benefits of treatment vs non-treatment were explained and discussed. All questions were answered.  She informed that she and her family are going for a family trip to [place] and [place], they are leaving tomorrow.   Past Psychiatric History: Anxiety, bipolar  d/o  Previous Psychotropic Medications: Yes   Substance Abuse History in the last 12 months:  No.  Consequences of Substance Abuse: Negative  Past Medical History:  Past Medical History:  Diagnosis Date   Anemia    Back pain    Pt states chronic back pain after fall in 2016   Depression    feeling balanced, no problems   GERD (gastroesophageal reflux disease)     Past Surgical History:  Procedure Laterality Date   NASAL SINUS SURGERY     TONSILLECTOMY      Family Psychiatric History: father-suspected bipolar d/o  Family History:  Family History  Problem Relation Age of Onset   Diabetes Maternal Uncle    Atrial fibrillation Mother    Pulmonary embolism Mother    Anemia Mother    Hypertension Father    Hypothyroidism Father     Social History:   Social History   Socioeconomic History   Marital status: Single    Spouse name: Not on file   Number of children: 1   Years of education: 12   Highest education level: 12th grade  Occupational History   Not on file  Tobacco Use   Smoking status: Former    Pack years: 0.00    Types: Cigarettes    Quit date: 01/13/2016    Years since quitting: 5.0   Smokeless tobacco: Never  Vaping Use   Vaping Use: Never used  Substance and Sexual Activity   Alcohol use: Not Currently  Drug use: Not Currently    Types: Marijuana    Comment: 4 years ago   Sexual activity: Not Currently    Birth control/protection: Pill  Other Topics Concern   Not on file  Social History Narrative   Not on file   Social Determinants of Health   Financial Resource Strain: Not on file  Food Insecurity: Not on file  Transportation Needs: Not on file  Physical Activity: Not on file  Stress: Not on file  Social Connections: Not on file    Additional Social History: Works for [employer] center, 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 Has 2 children-67 y/o and 81 month old. Is  well-supported by parents.  Allergies:  No Known Allergies  Metabolic Disorder Labs: No results found for: HGBA1C, MPG No results found for: PROLACTIN No results found for: CHOL, TRIG, HDL, CHOLHDL, VLDL, LDLCALC Lab Results  Component Value Date   TSH 2.757 09/14/2016    Therapeutic Level Labs: No results found for: LITHIUM No results found for: CBMZ No results found for: VALPROATE  Current Medications: Current Outpatient Medications  Medication Sig Dispense Refill   hydrOXYzine (ATARAX/VISTARIL) 10 MG tablet Take 1 tablet (10 mg total) by mouth 3 (three) times daily as needed for anxiety. 90 tablet 1   traZODone (DESYREL) 50 MG tablet Take 1 tablet (50 mg total) by mouth at bedtime as needed for sleep. 30 tablet 1   calcium carbonate (TUMS - DOSED IN MG ELEMENTAL CALCIUM) 500 MG chewable tablet Chew 1 tablet by mouth daily.     diphenhydrAMINE (BENADRYL) 25 MG tablet Take 25 mg by mouth at bedtime as needed.     DULoxetine (CYMBALTA) 30 MG capsule Take 1 capsule (30 mg total) by mouth daily. 90 capsule 0   DULoxetine (CYMBALTA) 60 MG capsule Take 1 capsule (60 mg total) by mouth every evening. 90 capsule 0   ferrous sulfate 325 (65 FE) MG tablet Take 1 tablet (325 mg total) by mouth daily with breakfast. 30 tablet 3   ibuprofen (ADVIL) 600 MG tablet Take 1 tablet (600 mg total) by mouth every 6 (six) hours. 30 tablet 0   lamoTRIgine (LAMICTAL) 25 MG tablet Take 2 tablets daily 180 tablet 0   norethindrone (MICRONOR) 0.35 MG tablet Start on the Sunday after baby turns 58 weeks old 1 Package 11   Prenatal Vit-Fe Fumarate-FA (PRENATAL VITAMIN) 27-0.8 MG TABS Take 1 tablet by mouth daily.     No current facility-administered medications for this visit.     Psychiatric Specialty Exam: ROS  unknown if currently breastfeeding.There is no height or weight on file to calculate BMI.  General Appearance: Well Groomed  Eye Contact:  Good  Speech:  Clear and Coherent and Normal Rate   Volume:  Normal  Mood:  Anxious  Affect:  Congruent  Thought Process:  Goal Directed, Linear, and Descriptions of Associations: Intact  Orientation:  Full (Time, Place, and Person)  Thought Content:  Logical  Suicidal Thoughts:  No  Homicidal Thoughts:  No  Memory:  Recent;   Good Remote;   Good  Judgement:  Good  Insight:  Good  Psychomotor Activity:  Normal  Concentration:  Concentration: Good and Attention Span: Good  Recall:  Good  Fund of Knowledge:Good  Language: Good  Akathisia:  Negative  Handed:  Right  AIMS (if indicated):  not done  Assets:  Communication Skills Desire for Improvement Financial Resources/Insurance Resilience Social Support  Talents/Skills  ADL's:  Intact  Cognition: WNL  Sleep:  Good   Screenings: GAD-7    Flowsheet Row Video Visit from 02/09/2019 in Center for [facility] Routine Prenatal from 02/02/2019 in Center for [facility] Routine Prenatal from 10/28/2018 in Center for [facility] Routine Prenatal from 09/28/2018 in Center for [facility] Initial Prenatal from 08/31/2018 in Center for [facility]  Total GAD-7 Score 0 0 0 0 0      PHQ2-9    Flowsheet Row Video Visit from 11/14/2020 in [facility] Video Visit from 02/09/2019 in Center for [facility] Routine Prenatal from 02/02/2019 in Center for [facility] Routine Prenatal from 10/28/2018 in [facility] for [facility] Routine Prenatal from 09/28/2018 in Center for [facility]  PHQ-2 Total Score 0 0 0 0 0  PHQ-9 Total Score -- 0 0 0 0      Flowsheet Row Video Visit from 11/14/2020 in [facility]  C-SSRS RISK CATEGORY No Risk       Assessment and Plan: Pt reported noticing significant anxiety and poor sleep over the past few weeks. She is agreeable to trial of Hydroxyzine to help  with anxiety and trial of Trazodone to target poor sleep.Potential side effects of medication and risks vs benefits of treatment vs non-treatment were explained and discussed. All questions were answered.   1. Bipolar 2 disorder (HCC)  - DULoxetine (CYMBALTA) 30 MG capsule; Take 1 capsule (30 mg total) by mouth daily.  Dispense: 90 capsule; Refill: 0 - DULoxetine (CYMBALTA) 60 MG capsule; Take 1 capsule (60 mg total) by mouth every evening.  Dispense: 90 capsule; Refill: 0 - lamoTRIgine (LAMICTAL) 25 MG tablet; Take 2 tablets daily  Dispense: 180 tablet; Refill: 0 - Start traZODone (DESYREL) 50 MG tablet; Take 1 tablet (50 mg total) by mouth at bedtime as needed for sleep.  Dispense: 30 tablet; Refill: 1  2. Anxiety state -  Start hydrOXYzine (ATARAX/VISTARIL) 10 MG tablet; Take 1 tablet (10 mg total) by mouth 3 (three) times daily as needed for anxiety.  Dispense: 90 tablet; Refill: 1  F/up in 2 months. Pt was informed that her care is being transferred to anither psychiatric with [facility] psychiatry clinic due to writer leaving the office. She verbalized her understanding.  Zena Amos, MD 6/10/20229:41 AM

## 2021-03-03 ENCOUNTER — Other Ambulatory Visit (HOSPITAL_COMMUNITY): Payer: Self-pay | Admitting: Psychiatry

## 2021-03-03 DIAGNOSIS — F3181 Bipolar II disorder: Secondary | ICD-10-CM
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
## 2021-04-17 ENCOUNTER — Other Ambulatory Visit: Payer: Self-pay

## 2021-04-17 ENCOUNTER — Telehealth (INDEPENDENT_AMBULATORY_CARE_PROVIDER_SITE_OTHER): Payer: Medicaid Other | Admitting: Psychiatry

## 2021-04-17 ENCOUNTER — Encounter: Payer: Self-pay | Admitting: Psychiatry

## 2021-04-17 DIAGNOSIS — F0630 Mood disorder due to known physiological condition, unspecified: Secondary | ICD-10-CM | POA: Diagnosis not present

## 2021-04-17 DIAGNOSIS — F3181 Bipolar II disorder: Secondary | ICD-10-CM

## 2021-04-17 DIAGNOSIS — F411 Generalized anxiety disorder: Secondary | ICD-10-CM

## 2021-04-17 MED ORDER — LAMOTRIGINE 25 MG PO TABS: 50.0000 mg | ORAL_TABLET | Freq: Every day | ORAL | 0 refills | Status: DC

## 2021-04-17 MED ORDER — DULOXETINE HCL 60 MG PO CPEP: 120.0000 mg | ORAL_CAPSULE | Freq: Every day | ORAL | 1 refills | Status: DC

## 2021-05-06 DIAGNOSIS — F32A Depression, unspecified: Secondary | ICD-10-CM | POA: Insufficient documentation
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
## 2021-06-07 ENCOUNTER — Encounter: Payer: Self-pay | Admitting: Psychiatry

## 2021-06-07 ENCOUNTER — Other Ambulatory Visit: Payer: Self-pay

## 2021-06-07 ENCOUNTER — Telehealth (INDEPENDENT_AMBULATORY_CARE_PROVIDER_SITE_OTHER): Payer: Medicaid Other | Admitting: Psychiatry

## 2021-06-07 DIAGNOSIS — F411 Generalized anxiety disorder: Secondary | ICD-10-CM | POA: Diagnosis not present

## 2021-06-07 DIAGNOSIS — G4700 Insomnia, unspecified: Secondary | ICD-10-CM | POA: Diagnosis not present

## 2021-06-07 DIAGNOSIS — F0630 Mood disorder due to known physiological condition, unspecified: Secondary | ICD-10-CM

## 2021-06-07 NOTE — Patient Instructions (Signed)
Increase duloxetine 120 mg daily Continue lamotrigine 50 mg daily Continue trazodone 50 mg at night as needed for sleep Continue hydroxyzine 10 mg three times as needed for anxiety Next appointment- 12/5 at 4 PM
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
## 2021-08-06 ENCOUNTER — Encounter: Payer: Self-pay | Admitting: Psychiatry

## 2021-08-06 ENCOUNTER — Telehealth (INDEPENDENT_AMBULATORY_CARE_PROVIDER_SITE_OTHER): Payer: Medicaid Other | Admitting: Psychiatry

## 2021-08-06 DIAGNOSIS — G4700 Insomnia, unspecified: Secondary | ICD-10-CM

## 2021-08-06 DIAGNOSIS — F411 Generalized anxiety disorder: Secondary | ICD-10-CM | POA: Diagnosis not present

## 2021-08-06 DIAGNOSIS — F4310 Post-traumatic stress disorder, unspecified: Secondary | ICD-10-CM | POA: Diagnosis not present

## 2021-08-06 DIAGNOSIS — F331 Major depressive disorder, recurrent, moderate: Secondary | ICD-10-CM | POA: Diagnosis not present

## 2021-08-06 MED ORDER — ARIPIPRAZOLE 2 MG PO TABS: 2.0000 mg | ORAL_TABLET | Freq: Every day | ORAL | 1 refills | Status: DC

## 2021-08-06 MED ORDER — DULOXETINE HCL 60 MG PO CPEP: 120.0000 mg | ORAL_CAPSULE | Freq: Every day | ORAL | 0 refills | Status: DC

## 2021-08-06 NOTE — Patient Instructions (Signed)
Continue duloxetine 120 mg daily Decrease lamotrigine 25 mg daily for 1 week, then discontinue Start Abilify 2 mg at night Continue trazodone 50 mg at night as needed for sleep Continue hydroxyzine 10 mg three times as needed for anxiety Next appointment: 1/10 at 4 PM

## 2021-08-07 ENCOUNTER — Telehealth: Payer: Self-pay

## 2021-08-07 NOTE — Telephone Encounter (Signed)
a prior aut is needed for the aripiprazole

## 2021-08-07 NOTE — Telephone Encounter (Signed)
went online to [email or web address] and submited the prior auth- pending

## 2021-08-09 NOTE — Telephone Encounter (Signed)
received fax from [employer] that prior auth was approved for 12 months from 08-07-21 to 08-07-22

## 2021-08-31 ENCOUNTER — Other Ambulatory Visit: Payer: Self-pay | Admitting: Psychiatry

## 2021-09-11 ENCOUNTER — Telehealth: Payer: Medicaid Other | Admitting: Psychiatry
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
## 2021-09-17 ENCOUNTER — Telehealth (INDEPENDENT_AMBULATORY_CARE_PROVIDER_SITE_OTHER): Payer: Medicaid Other | Admitting: Psychiatry

## 2021-09-17 ENCOUNTER — Other Ambulatory Visit: Payer: Self-pay

## 2021-09-17 ENCOUNTER — Encounter: Payer: Self-pay | Admitting: Psychiatry

## 2021-09-17 DIAGNOSIS — F411 Generalized anxiety disorder: Secondary | ICD-10-CM | POA: Diagnosis not present

## 2021-09-17 DIAGNOSIS — F4310 Post-traumatic stress disorder, unspecified: Secondary | ICD-10-CM | POA: Diagnosis not present

## 2021-09-17 DIAGNOSIS — G4700 Insomnia, unspecified: Secondary | ICD-10-CM

## 2021-09-17 DIAGNOSIS — F3341 Major depressive disorder, recurrent, in partial remission: Secondary | ICD-10-CM | POA: Diagnosis not present

## 2021-09-17 MED ORDER — ARIPIPRAZOLE 2 MG PO TABS: 2.0000 mg | ORAL_TABLET | Freq: Every day | ORAL | 0 refills | Status: DC

## 2021-09-17 MED ORDER — DULOXETINE HCL 60 MG PO CPEP: 120.0000 mg | ORAL_CAPSULE | Freq: Every day | ORAL | 1 refills | Status: DC

## 2021-09-17 NOTE — Patient Instructions (Signed)
Continue duloxetine 120 mg daily Continue Abilify 2 mg at night Continue trazodone 50 mg at night as needed for sleep Continue hydroxyzine 10 mg three times as needed for anxiety Next appointment: 3/13 at 4:30, video

## 2021-10-04 ENCOUNTER — Other Ambulatory Visit (HOSPITAL_COMMUNITY): Payer: Self-pay | Admitting: Psychiatry

## 2021-10-04 ENCOUNTER — Other Ambulatory Visit: Payer: Self-pay | Admitting: Psychiatry

## 2021-10-04 DIAGNOSIS — F3181 Bipolar II disorder: Secondary | ICD-10-CM

## 2021-11-07 ENCOUNTER — Other Ambulatory Visit: Payer: Self-pay | Admitting: Psychiatry

## 2021-11-07 DIAGNOSIS — F3181 Bipolar II disorder: Secondary | ICD-10-CM
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
## 2021-11-12 ENCOUNTER — Other Ambulatory Visit: Payer: Self-pay

## 2021-11-12 ENCOUNTER — Encounter: Payer: Self-pay | Admitting: Psychiatry

## 2021-11-12 ENCOUNTER — Telehealth (INDEPENDENT_AMBULATORY_CARE_PROVIDER_SITE_OTHER): Payer: Medicaid Other | Admitting: Psychiatry

## 2021-11-12 DIAGNOSIS — F4310 Post-traumatic stress disorder, unspecified: Secondary | ICD-10-CM | POA: Diagnosis not present

## 2021-11-12 DIAGNOSIS — G4700 Insomnia, unspecified: Secondary | ICD-10-CM | POA: Diagnosis not present

## 2021-11-12 DIAGNOSIS — F330 Major depressive disorder, recurrent, mild: Secondary | ICD-10-CM | POA: Diagnosis not present

## 2021-11-12 DIAGNOSIS — F411 Generalized anxiety disorder: Secondary | ICD-10-CM | POA: Diagnosis not present

## 2021-11-12 MED ORDER — ARIPIPRAZOLE 5 MG PO TABS: 5.0000 mg | ORAL_TABLET | Freq: Every day | ORAL | 0 refills | Status: DC

## 2021-11-12 NOTE — Patient Instructions (Signed)
Continue duloxetine 120 mg daily ?Increase Abilify 5 mg at night  ?Continue trazodone 50 mg at night as needed for sleep ?Continue hydroxyzine 10 mg three times as needed for anxiety ?Next appointment: 5/9 at 3 PM, in person ? ?The next visit will be in person visit. Please arrive 15 mins before the scheduled time.  ? ?[facility]  ?Address: [address], [place], [place] [address]   ?

## 2021-11-26 ENCOUNTER — Other Ambulatory Visit: Payer: Self-pay | Admitting: Psychiatry

## 2021-12-11 DIAGNOSIS — Z113 Encounter for screening for infections with a predominantly sexual mode of transmission: Secondary | ICD-10-CM | POA: Diagnosis not present

## 2021-12-15 ENCOUNTER — Other Ambulatory Visit: Payer: Self-pay | Admitting: Psychiatry

## 2022-01-05 NOTE — Progress Notes (Signed)
BH MD/PA/NP OP Progress Note ? ?01/08/2022 3:39 PM ?Kaitlyn Whitaker  ?MRN:  [id number] ? ?Chief Complaint:  ?Chief Complaint  ?Patient presents with  ? Depression  ? Follow-up  ? ?HPI:  ?This is a follow-up appointment for depression, PTSD and insomnia.  ?She states that she has been feeling better since taking higher dose of Abilify.  She is not feeling anxious as much compared to before.  She bought a house, and her parents moved in with her with a plan to move out after they find the place.  She is going to school for her bachelor's degree through her work.  She states that she is doing this for her work, and not for her career. She feels overwhelmed at times, handling school work, work, and taking care of her children. She is planning to get credentialed at [employer] next year. 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 She reports good relationship with her children.  However, she occasionally feels trying due to her mood.  She has depressive symptoms as in PHQ-9.  She has middle insomnia and snoring.  She denies SI.  She feels good about weight gain/increase in appetite since uptitration of Abilify.  She feels less anxious.  She feels comfortable to stay on the current medication regimen.  ? 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 Will obtain a degree in medical coding ? ? ?Was 105 lbs (usually 100 lbs) ?Wt Readings from Last 3 Encounters:  ?01/08/22 108 lb 12.8 oz (49.4 kg)  ?02/10/19 141 lb 5 oz (64.1 kg)  ?02/09/19 140 lb (63.5 kg)  ?  ?Visit Diagnosis:  ?  ICD-10-CM   ?1. MDD (major depressive disorder), recurrent episode, moderate (HCC)  F33.1   ?  ?2. Anxiety state  F41.1   ?  ?3. PTSD (post-traumatic stress disorder)   F43.10   ?  ?4. Insomnia, unspecified type  G47.00   ?  ? ? ?Past Psychiatric History: Please see initial evaluation for full details. I have reviewed the history. No updates at this time.  ?  ? ?Past Medical History:  ?Past Medical History:  ?Diagnosis Date  ? Anemia   ? Back pain   ? Pt states chronic back pain after fall in 2016  ? Depression   ? feeling balanced, no problems  ? GERD (gastroesophageal reflux disease)   ?  ?Past Surgical History:  ?Procedure Laterality Date  ? NASAL SINUS SURGERY    ? TONSILLECTOMY    ? ? ?Family Psychiatric History: Please see initial evaluation for full details. I have reviewed the history. No updates at this time.  ?  ? ?Family History:  ?Family History  ?Problem Relation Age of Onset  ? Anxiety disorder Mother   ? Atrial fibrillation Mother   ? Pulmonary embolism Mother   ? Anemia Mother   ? Anxiety disorder Father   ? Hypertension Father   ? Hypothyroidism Father   ? Diabetes Maternal Uncle   ? ? ?Social History:  ?Social History  ? ?Socioeconomic History  ? Marital status: Single  ?  Spouse name: Not on file  ? Number of children: 1  ? Years of education: 44  ? Highest education level: 12th grade  ?Occupational History  ?  Not on file  ?Tobacco Use  ? Smoking status: Former  ?  Types: Cigarettes  ?  Quit date: 01/13/2016  ?  Years since quitting: 26  ? Smokeless tobacco: Never  ?Vaping Use  ? Vaping Use: Never used  ?Substance and Sexual Activity  ? Alcohol use: Yes  ?  Comment: Occasional beer 2 times a month  ? Drug use: Not Currently  ?  Types: Marijuana  ?  Comment: 4 years ago  ? Sexual activity: Yes  ?  Partners: Male  ?  Birth control/protection: Pill, Condom  ?Other Topics Concern  ? Not on file  ?Social History Narrative  ? Not on file  ? ?Social Determinants of Health  ? ?Financial Resource Strain: Not on file  ?Food Insecurity: Not on file  ?Transportation Needs: Not on file  ?Physical Activity: Not on file  ?Stress: Not on file  ?Social Connections: Not on file   ? ? ?Allergies: No Known Allergies ? ?Metabolic Disorder Labs: ?No results found for: HGBA1C, MPG ?No results found for: PROLACTIN ?No results found for: CHOL, TRIG, HDL, CHOLHDL, VLDL, LDLCALC ?Lab Results  ?Component Value Date  ? TSH 2.757 09/14/2016  ? ? ?Therapeutic Level Labs: ?No results found for: LITHIUM ?No results found for: VALPROATE ?No components found for:  CBMZ ? ?Current Medications: ?Current Outpatient Medications  ?Medication Sig Dispense Refill  ? DULoxetine (CYMBALTA) 60 MG capsule Take 2 capsules (120 mg total) by mouth daily. 180 capsule 1  ? hydrOXYzine (ATARAX/VISTARIL) 10 MG tablet TAKE 1 TABLET BY MOUTH 3 TIMES DAILY AS NEEDED FOR ANXIETY. 270 tablet 1  ? traZODone (DESYREL) 50 MG tablet TAKE 1 TABLET BY MOUTH EVERY DAY AT BEDTIME AS NEEDED FOR SLEEP 90 tablet 1  ? ARIPiprazole (ABILIFY) 2 MG tablet Take 2 tablets (4 mg total) by mouth at bedtime. 180 tablet 0  ? ARIPiprazole (ABILIFY) 5 MG tablet Take 1 tablet (5 mg total) by mouth daily. Start after finishing 4 mg daily (with 2 mg tabs) (Patient not taking: Reported on 01/08/2022) 30 tablet 0  ? ?No current facility-administered medications for this visit.  ? ? ? ?Musculoskeletal: ?Strength & Muscle Tone: within normal limits ?Gait & Station: normal ?Patient leans: N/A ? ?Psychiatric Specialty Exam: ?Review of Systems  ?Psychiatric/Behavioral:  Positive for dysphoric mood and sleep disturbance. Negative for agitation, behavioral problems, confusion, decreased concentration, hallucinations, self-injury and suicidal ideas. The patient is nervous/anxious. The patient is not hyperactive.   ?All other systems reviewed and are negative.  ?Blood pressure 116/69, pulse 77, temperature 98.2 ?F (36.8 ?C), height 5\' 5"  (1.651 m), weight 108 lb 12.8 oz (49.4 kg), SpO2 97 %, unknown if currently breastfeeding.Body mass index is 18.11 kg/m?.  ?General Appearance: Casual  ?Eye Contact:  Good  ?Speech:  Clear and Coherent  ?Volume:  Normal  ?Mood:   good   ?Affect:  Appropriate, Congruent, and Full Range  ?Thought Process:  Coherent  ?Orientation:  Full (Time, Place, and Person)  ?Thought Content: Logical   ?Suicidal Thoughts:  No  ?Homicidal Thoughts:  No  ?Memory:  Immediate;   Good  ?Judgement:  Good  ?Insight:  Good  ?Psychomotor Activity:  Normal. No tremors, no rigidity  ?Concentration:  Concentration: Good and Attention Span: Good  ?Recall:  Good  ?Fund of Knowledge: Good  ?Language: Good  ?Akathisia:  No  ?Handed:  Right  ?AIMS (if indicated): not done  ?Assets:  Communication Skills ?Desire for Improvement  ?ADL's:  Intact  ?Cognition:  WNL  ?Sleep:  Fair  ? ?Screenings: ?GAD-7   ? ?Flowsheet Row Video Visit from 02/09/2019 in [facility] for [facility] Routine Prenatal from 02/02/2019 in [facility] for [facility] Routine Prenatal from 10/28/2018 in [facility] for [facility] Routine Prenatal from 09/28/2018 in [facility] for [facility] Initial Prenatal from 08/31/2018 in [facility] for [facility]  ?Total GAD-7 Score 0 0 0 0 0  ? ?  ? ?PHQ2-9   ? ?Flowsheet Row Office Visit from 01/08/2022 in [facility] Video Visit from 09/17/2021 in [facility] Video Visit from 08/06/2021 in [facility] Video Visit from 04/17/2021 in [facility] Video Visit from 11/14/2020 in [facility]  ?PHQ-2 Total Score 2 0 2 2 0  ?PHQ-9 Total Score 11 -- 10 7 --  ? ?  ? ?Flowsheet Row Office Visit from 01/08/2022 in [facility] Video Visit from 06/07/2021 in [facility] Video Visit from 11/14/2020 in [facility]  ?C-SSRS RISK CATEGORY No Risk Low Risk No Risk  ? ?  ? ? ? ?Assessment and Plan:  ?Marvin Maenza is a 26 y.o. year old female with a history of depression, GERD, who presents for follow up  appointment for below.  ? ? ?1. MDD (major depressive disorder), recurrent episode, moderate (HCC) ?2. Anxiety state ?3. PTSD (post-traumatic stress disorder) ?There has been significant improvement in

## 2022-01-08 ENCOUNTER — Encounter: Payer: Self-pay | Admitting: Psychiatry

## 2022-01-08 ENCOUNTER — Ambulatory Visit (INDEPENDENT_AMBULATORY_CARE_PROVIDER_SITE_OTHER): Payer: Medicaid Other | Admitting: Psychiatry

## 2022-01-08 VITALS — BP 116/69 | HR 77 | Temp 98.2°F | Ht 65.0 in | Wt 108.8 lb

## 2022-01-08 DIAGNOSIS — F331 Major depressive disorder, recurrent, moderate: Secondary | ICD-10-CM

## 2022-01-08 DIAGNOSIS — F4310 Post-traumatic stress disorder, unspecified: Secondary | ICD-10-CM | POA: Diagnosis not present

## 2022-01-08 DIAGNOSIS — G4700 Insomnia, unspecified: Secondary | ICD-10-CM

## 2022-01-08 DIAGNOSIS — F411 Generalized anxiety disorder: Secondary | ICD-10-CM

## 2022-01-08 MED ORDER — ARIPIPRAZOLE 2 MG PO TABS: 4.0000 mg | ORAL_TABLET | Freq: Every day | ORAL | 0 refills | Status: DC

## 2022-01-08 NOTE — Patient Instructions (Addendum)
Continue duloxetine 120 mg daily ?Continue Abilify 4 mg at night ?Continue trazodone 50 mg at night as needed for sleep ?Continue hydroxyzine 10 mg three times as needed for anxiety ?Next appointment: 8/9 at 2 PM, video ?

## 2022-02-17 DIAGNOSIS — Z202 Contact with and (suspected) exposure to infections with a predominantly sexual mode of transmission: Secondary | ICD-10-CM | POA: Diagnosis not present

## 2022-04-02 ENCOUNTER — Other Ambulatory Visit: Payer: Self-pay | Admitting: Psychiatry

## 2022-04-06 ENCOUNTER — Other Ambulatory Visit: Payer: Self-pay | Admitting: Psychiatry
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
## 2022-04-10 ENCOUNTER — Telehealth (INDEPENDENT_AMBULATORY_CARE_PROVIDER_SITE_OTHER): Payer: Medicaid Other | Admitting: Psychiatry

## 2022-04-10 ENCOUNTER — Encounter: Payer: Self-pay | Admitting: Psychiatry

## 2022-04-10 DIAGNOSIS — G4700 Insomnia, unspecified: Secondary | ICD-10-CM | POA: Diagnosis not present

## 2022-04-10 DIAGNOSIS — F4310 Post-traumatic stress disorder, unspecified: Secondary | ICD-10-CM

## 2022-04-10 DIAGNOSIS — F411 Generalized anxiety disorder: Secondary | ICD-10-CM | POA: Diagnosis not present

## 2022-04-10 DIAGNOSIS — F3341 Major depressive disorder, recurrent, in partial remission: Secondary | ICD-10-CM

## 2022-04-10 MED ORDER — DULOXETINE HCL 60 MG PO CPEP: 120.0000 mg | ORAL_CAPSULE | Freq: Every day | ORAL | 1 refills | Status: DC

## 2022-04-10 NOTE — Patient Instructions (Signed)
Continue duloxetine 120 mg daily Continue Abilify 4 mg at night Continue trazodone 50 mg at night as needed for sleep Continue hydroxyzine 10 mg three times as needed for anxiety Next appointment: 11/1 at 11 AM

## 2022-05-28 DIAGNOSIS — N898 Other specified noninflammatory disorders of vagina: Secondary | ICD-10-CM | POA: Diagnosis not present

## 2022-05-28 DIAGNOSIS — Z113 Encounter for screening for infections with a predominantly sexual mode of transmission: Secondary | ICD-10-CM | POA: Diagnosis not present
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
## 2022-07-03 ENCOUNTER — Telehealth (INDEPENDENT_AMBULATORY_CARE_PROVIDER_SITE_OTHER): Payer: Medicaid Other | Admitting: Psychiatry

## 2022-07-03 ENCOUNTER — Encounter: Payer: Self-pay | Admitting: Psychiatry

## 2022-07-03 DIAGNOSIS — F3341 Major depressive disorder, recurrent, in partial remission: Secondary | ICD-10-CM | POA: Diagnosis not present

## 2022-07-03 DIAGNOSIS — F411 Generalized anxiety disorder: Secondary | ICD-10-CM

## 2022-07-03 DIAGNOSIS — F4310 Post-traumatic stress disorder, unspecified: Secondary | ICD-10-CM | POA: Diagnosis not present

## 2022-07-03 MED ORDER — ARIPIPRAZOLE 2 MG PO TABS: 4.0000 mg | ORAL_TABLET | Freq: Every day | ORAL | 0 refills | Status: DC

## 2022-07-03 NOTE — Patient Instructions (Signed)
Continue duloxetine 120 mg daily Continue Abilify 4 mg at night Continue trazodone 50 mg at night as needed for sleep  Continue hydroxyzine 10 mg three times as needed for anxiety Next appointment: 1/17 at 9 AM

## 2022-07-10 ENCOUNTER — Telehealth: Payer: Self-pay | Admitting: Family Medicine

## 2022-07-10 NOTE — Telephone Encounter (Signed)
Patient called in saying she needs a prescription refill for her birth control. I got her scheduled for her annual but she will run out of pills before then. She asks that the refill be sent to [facility] on [address] in [place].

## 2022-07-11 MED ORDER — NORETHIN ACE-ETH ESTRAD-FE 1-20 MG-MCG(24) PO TABS: 1.0000 | ORAL_TABLET | Freq: Every day | ORAL | 0 refills | Status: DC

## 2022-07-11 NOTE — Addendum Note (Signed)
Addended by: Kathee Delton on: 07/11/2022 02:34 PM   Modules accepted: Orders

## 2022-07-11 NOTE — Telephone Encounter (Signed)
Called patient, no answer- left message asking patient to return our phone call

## 2022-07-11 NOTE — Telephone Encounter (Signed)
Patient called into front office stating she is returning my phone call. Asked patient if her past provider would refill her OCPs until her appt with [place]. She states she already checked with them 5 weeks ago and has been waiting to hear back. Informed patient of single refill but that we couldn't send anymore in until her appt since we haven't seen her in 3 years. Patient verbalized understanding and is aware of appt on 11/28.

## 2022-07-30 ENCOUNTER — Encounter: Payer: Self-pay | Admitting: Obstetrics and Gynecology

## 2022-07-30 ENCOUNTER — Other Ambulatory Visit (HOSPITAL_COMMUNITY)
Admission: RE | Admit: 2022-07-30 | Discharge: 2022-07-30 | Disposition: A | Discharge status: Home / Self Care | Payer: Medicaid Other | Source: Ambulatory Visit | Attending: Obstetrics and Gynecology | Admitting: Obstetrics and Gynecology

## 2022-07-30 ENCOUNTER — Other Ambulatory Visit: Payer: Self-pay

## 2022-07-30 ENCOUNTER — Ambulatory Visit (INDEPENDENT_AMBULATORY_CARE_PROVIDER_SITE_OTHER): Payer: Medicaid Other | Admitting: Obstetrics and Gynecology

## 2022-07-30 VITALS — BP 108/67 | HR 87 | Ht 65.0 in | Wt 106.5 lb

## 2022-07-30 DIAGNOSIS — Z3202 Encounter for pregnancy test, result negative: Secondary | ICD-10-CM

## 2022-07-30 DIAGNOSIS — Z01419 Encounter for gynecological examination (general) (routine) without abnormal findings: Secondary | ICD-10-CM | POA: Insufficient documentation

## 2022-07-30 LAB — POCT PREGNANCY, URINE: Preg Test, Ur: NEGATIVE

## 2022-07-30 MED ORDER — NORETHIN ACE-ETH ESTRAD-FE 1-20 MG-MCG(24) PO TABS: 1.0000 | ORAL_TABLET | Freq: Every day | ORAL | 11 refills | Status: DC

## 2022-07-30 NOTE — Progress Notes (Signed)
ANNUAL EXAM Patient name: Kaitlyn Whitaker MRN [id number]  Date of birth: [birth year] Chief Complaint:   Gynecologic Exam  History of Present Illness:   Kaitlyn Whitaker is a 26 y.o. [phone] female being seen today for a routine annual exam. Using OCPs previously and would like to continue. Sexually active with fiance. Denies dysuria, dysmenorrhea, dyschezia and dyspareunia. No issues with menses when on OCPs. Considering conception in a few years. No breast or nipple complaints/changes.  Current complaints: none  No LMP recorded.   The pregnancy intention screening data noted above was reviewed. Potential methods of contraception were discussed. The patient elected to proceed with No data recorded.   Last pap 12/2019. Results were: NILM w/ HRHPV not done. H/O abnormal pap: no Last mammogram: n/a Last colonoscopy:n/a     07/30/2022    4:39 PM 01/08/2022    3:07 PM 09/17/2021    4:43 PM 08/06/2021    4:09 PM 04/17/2021    9:23 AM  Depression screen PHQ 2/9  Decreased Interest 0      Down, Depressed, Hopeless 0      PHQ - 2 Score 0      Altered sleeping 0      Tired, decreased energy 0      Change in appetite 0      Feeling bad or failure about yourself  0      Trouble concentrating 0      Moving slowly or fidgety/restless 0      Suicidal thoughts 0      PHQ-9 Score 0      Difficult doing work/chores          Information is confidential and restricted. Go to Review Flowsheets to unlock data.        07/30/2022    4:40 PM 02/09/2019   10:47 AM 02/02/2019    2:42 PM 10/28/2018    4:41 PM  GAD 7 : Generalized Anxiety Score  Nervous, Anxious, on Edge 0 0 0 0  Control/stop worrying 0 0 0 0  Worry too much - different things 0 0 0 0  Trouble relaxing 0 0 0 0  Restless 0 0 0 0  Easily annoyed or irritable 0 0 0 0  Afraid - awful might happen 0 0 0 0  Total GAD 7 Score 0 0 0 0     Review of Systems:   Pertinent items are noted in HPI Denies any headaches, blurred vision,  fatigue, shortness of breath, chest pain, abdominal pain, abnormal vaginal discharge/itching/odor/irritation, problems with periods, bowel movements, urination, or intercourse unless otherwise stated above. Pertinent History Reviewed:  Reviewed past medical,surgical, social and family history.  Reviewed problem list, medications and allergies. Physical Assessment:   Vitals:   07/30/22 1553  BP: 108/67  Pulse: 87  Weight: 106 lb 8 oz (48.3 kg)  Height: 5\' 5"  (1.651 m)  Body mass index is 17.72 kg/m.        Physical Examination:   General appearance - well appearing, and in no distress  Mental status - alert, oriented to person, place, and time  Psych:  She has a normal mood and affect  Skin - warm and dry, normal color, no suspicious lesions noted  Chest - effort normal, all lung fields clear to auscultation bilaterally  Heart - normal rate and regular rhythm  Breasts - breasts appear normal, no suspicious masses, no skin or nipple changes or  axillary nodes  Abdomen - soft, nontender, nondistended, no masses  or organomegaly  Pelvic -  VULVA: normal appearing vulva with no masses, tenderness or lesions   VAGINA: normal appearing vagina with normal color and discharge, no lesions   CERVIX: normal appearing cervix without discharge or lesions, no CMT  Thin prep pap is done with reflex HR HPV cotesting  UTERUS: uterus is felt to be normal size, shape, consistency and nontender   ADNEXA: No adnexal masses or tenderness noted.  Extremities:  No swelling or varicosities noted  Chaperone present for exam  Results for orders placed or performed in visit on 07/30/22 (from the past 24 hour(s))  Pregnancy, urine POC   Collection Time: 07/30/22  4:06 PM  Result Value Ref Range   Preg Test, Ur NEGATIVE NEGATIVE     Assessment & Plan:  1. Well woman exam with routine gynecological exam Refill of OCP sent to pharmacy. Pap collected today.  - Norethindrone Acetate-Ethinyl Estrad-FE  (LOESTRIN 24 FE) 1-20 MG-MCG(24) tablet; Take 1 tablet by mouth daily.  Dispense: 28 tablet; Refill: 11 - Cytology - PAP( [place])   Labs/procedures today: upt  Mammogram: @ 26yo, or sooner if problems Colonoscopy: @ 26yo, or sooner if problems  Orders Placed This Encounter  Procedures   Pregnancy, urine POC    Meds:  Meds ordered this encounter  Medications   Norethindrone Acetate-Ethinyl Estrad-FE (LOESTRIN 24 FE) 1-20 MG-MCG(24) tablet    Sig: Take 1 tablet by mouth daily.    Dispense:  28 tablet    Refill:  11    Follow-up: No follow-ups on file.  Lorriane Shire, MD 07/30/2022 6:09 PM

## 2022-08-02 LAB — CYTOLOGY - PAP: Diagnosis: NEGATIVE

## 2022-08-31 DIAGNOSIS — W540XXA Bitten by dog, initial encounter: Secondary | ICD-10-CM | POA: Diagnosis not present

## 2022-08-31 DIAGNOSIS — L03012 Cellulitis of left finger: Secondary | ICD-10-CM | POA: Diagnosis not present

## 2022-09-02 NOTE — L&D Delivery Note (Addendum)
Delivery Note Kaitlyn Whitaker is a 27 y.o. [id number] at [redacted]w[redacted]d admitted for spontaneous onset of labor.   GBS Status: Negative/-- (10/09 0455) Maximum Maternal Temperature: 97.51F  Labor course: Initial SVE: 5/90/-2. No augmentation. She then progressed to complete.  ROM: 0h 6m with clear fluid  Birth: At 0900 a viable female was delivered via spontaneous vaginal delivery (Presentation LOA . Nuchal cord present: No.  Shoulders and body delivered in usual fashion. Infant placed directly on mom's abdomen for bonding/skin-to-skin, baby dried and stimulated. Cord clamped x 2 after 1 minute and cut by father of baby.  Cord blood collected.  The placenta separated spontaneously and delivered via gentle cord traction.  Pitocin infused rapidly IV per protocol.  Fundus firm with massage.  Placenta inspected and appears to be intact with a 3 VC. Sponge and instrument count were correct x2.  Intrapartum complications:  None Anesthesia:  none Episiotomy: none Lacerations: 1st degree periclitoral laceration, hemostatic after pressure, left to heal by secondary intention. EBL (mL): 154   Infant: APGAR (1 MIN): 8  APGAR (5 MINS): 9  APGAR (10 MINS):    Infant weight: pending  Mom to postpartum.  Baby to Couplet care / Skin to Skin. Placenta to L&D   Plans to  Breastmilk and formula Contraception: oral progesterone-only contraceptive Circumcision: wants inpatient  Note sent to [facility]: [facility] for pp visit.  Charma Igo, MD 06/25/2023 9:20 AM  Attending ATTESTATION  I was present and gloved for this delivery and agree with the above documentation in the resident's note except as below.  Celedonio Savage, MD [facility] (Faculty Practice) 06/25/2023, 9:46 AM

## 2022-09-05 DIAGNOSIS — M79644 Pain in right finger(s): Secondary | ICD-10-CM | POA: Diagnosis not present
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
## 2022-09-18 ENCOUNTER — Telehealth: Payer: Medicaid Other | Admitting: Psychiatry
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
## 2022-09-25 ENCOUNTER — Encounter: Payer: Self-pay | Admitting: Psychiatry

## 2022-09-25 ENCOUNTER — Telehealth (INDEPENDENT_AMBULATORY_CARE_PROVIDER_SITE_OTHER): Payer: 59 | Admitting: Psychiatry

## 2022-09-25 DIAGNOSIS — F411 Generalized anxiety disorder: Secondary | ICD-10-CM

## 2022-09-25 DIAGNOSIS — F3341 Major depressive disorder, recurrent, in partial remission: Secondary | ICD-10-CM | POA: Diagnosis not present

## 2022-09-25 DIAGNOSIS — G4700 Insomnia, unspecified: Secondary | ICD-10-CM | POA: Diagnosis not present

## 2022-09-25 DIAGNOSIS — F4310 Post-traumatic stress disorder, unspecified: Secondary | ICD-10-CM | POA: Diagnosis not present

## 2022-09-25 MED ORDER — ARIPIPRAZOLE 2 MG PO TABS: 4.0000 mg | ORAL_TABLET | Freq: Every day | ORAL | 0 refills | Status: DC

## 2022-09-25 MED ORDER — DULOXETINE HCL 60 MG PO CPEP: 120.0000 mg | ORAL_CAPSULE | Freq: Every day | ORAL | 1 refills | Status: DC

## 2022-09-25 NOTE — Patient Instructions (Signed)
Continue duloxetine 120 mg daily Continue Abilify 4 mg at night Obtain EKG  - please call [phone] to make an appointment  Continue trazodone 50 mg at night as needed for sleep  Continue hydroxyzine 10 mg three times as needed for anxiety Next appointment: 4/24 at 8 am

## 2022-10-02 DIAGNOSIS — Z3490 Encounter for supervision of normal pregnancy, unspecified, unspecified trimester: Secondary | ICD-10-CM | POA: Insufficient documentation

## 2022-10-05 ENCOUNTER — Other Ambulatory Visit: Payer: Self-pay | Admitting: Psychiatry

## 2022-10-26 ENCOUNTER — Other Ambulatory Visit: Payer: Self-pay | Admitting: Psychiatry

## 2022-11-04 ENCOUNTER — Ambulatory Visit (INDEPENDENT_AMBULATORY_CARE_PROVIDER_SITE_OTHER): Payer: 59

## 2022-11-04 VITALS — BP 127/78 | HR 111 | Wt 107.4 lb

## 2022-11-04 DIAGNOSIS — N912 Amenorrhea, unspecified: Secondary | ICD-10-CM

## 2022-11-04 LAB — POCT URINE PREGNANCY: Preg Test, Ur: POSITIVE — AB

## 2022-11-04 NOTE — Progress Notes (Signed)
Kaitlyn Whitaker presents today for UPT. She has hx of SABs and is nervous about this pregnancy. Reassurance given. She denies VB, LOF, and cramping at this time.   LMP: 09/29/22 - aprx.    OBJECTIVE: Appears well, in no apparent distress.  OB History     Gravida  5   Para  2   Term  2   Preterm      AB  2   Living  2      SAB  2   IAB      Ectopic      Multiple  0   Live Births  2          Home UPT Result: positive  In-Office UPT result: positive  I have reviewed the patient's medical, obstetrical, social, and family histories, and medications.   ASSESSMENT: Positive pregnancy test  PLAN Prenatal care to be completed at:  [facility] at [facility] with a female provider

## 2022-11-12 ENCOUNTER — Telehealth: Payer: Self-pay | Admitting: *Deleted

## 2022-11-12 DIAGNOSIS — O219 Vomiting of pregnancy, unspecified: Secondary | ICD-10-CM

## 2022-11-12 MED ORDER — PROMETHAZINE HCL 25 MG PO TABS: 25.0000 mg | ORAL_TABLET | Freq: Four times a day (QID) | ORAL | 0 refills | Status: DC | PRN

## 2022-11-12 NOTE — Telephone Encounter (Signed)
Received a voice message today from [place]. She states she just found out she is pregnant recently in our office. She wants to know if we can prescribe Zofran for her morning sickness. I called [facility] and explained our protocol is that I can send in Phenergan for her  morning sickness and that we prefer to not use Zofran until you are further in pregnancy. She voices understanding.RX sent in as requested.  Staci Acosta

## 2022-11-22 NOTE — Congregational Nurse Program (Signed)
Client looking for a PCP . She is pregnant and is in need of a provider. Pharmacist Apolonio Schneiders assist with education and referral to PCP as well as client has recently stopped taking anxiety meds due to her pregnancy. Pharmacist provided education on the medications she was taking . Blood glucose non tasting 120.

## 2022-11-26 ENCOUNTER — Inpatient Hospital Stay (HOSPITAL_COMMUNITY)
Admission: AD | Admit: 2022-11-26 | Discharge: 2022-11-26 | Disposition: A | Discharge status: Home / Self Care | Payer: 59 | Attending: Obstetrics & Gynecology | Admitting: Obstetrics & Gynecology

## 2022-11-26 ENCOUNTER — Encounter (HOSPITAL_COMMUNITY): Payer: Self-pay | Admitting: Obstetrics & Gynecology

## 2022-11-26 ENCOUNTER — Telehealth: Payer: Self-pay | Admitting: *Deleted

## 2022-11-26 ENCOUNTER — Encounter: Payer: Self-pay | Admitting: Obstetrics and Gynecology

## 2022-11-26 DIAGNOSIS — Z3A08 8 weeks gestation of pregnancy: Secondary | ICD-10-CM | POA: Diagnosis not present

## 2022-11-26 DIAGNOSIS — O26891 Other specified pregnancy related conditions, first trimester: Secondary | ICD-10-CM | POA: Diagnosis not present

## 2022-11-26 DIAGNOSIS — O219 Vomiting of pregnancy, unspecified: Secondary | ICD-10-CM | POA: Diagnosis not present

## 2022-11-26 DIAGNOSIS — O99351 Diseases of the nervous system complicating pregnancy, first trimester: Secondary | ICD-10-CM | POA: Diagnosis not present

## 2022-11-26 DIAGNOSIS — O99611 Diseases of the digestive system complicating pregnancy, first trimester: Secondary | ICD-10-CM | POA: Insufficient documentation

## 2022-11-26 LAB — URINALYSIS, ROUTINE W REFLEX MICROSCOPIC
Glucose, UA: NEGATIVE mg/dL
Hgb urine dipstick: NEGATIVE
Ketones, ur: 80 mg/dL — AB
Leukocytes,Ua: NEGATIVE
Nitrite: NEGATIVE
Protein, ur: 30 mg/dL — AB
Specific Gravity, Urine: 1.027 (ref 1.005–1.030)
pH: 6 (ref 5.0–8.0)

## 2022-11-26 MED ORDER — FAMOTIDINE 20 MG PO TABS
20.0000 mg | ORAL_TABLET | Freq: Once | ORAL | Status: AC
  Administered 2022-11-26: 20 mg via ORAL
  Filled 2022-11-26: qty 1

## 2022-11-26 MED ORDER — FAMOTIDINE 20 MG PO TABS: 20.0000 mg | ORAL_TABLET | Freq: Two times a day (BID) | ORAL | 1 refills | Status: DC

## 2022-11-26 MED ORDER — ONDANSETRON 4 MG PO TBDP
4.0000 mg | ORAL_TABLET | Freq: Once | ORAL | Status: AC
  Administered 2022-11-26: 4 mg via ORAL
  Filled 2022-11-26: qty 1

## 2022-11-26 MED ORDER — SCOPOLAMINE 1 MG/3DAYS TD PT72: 1.0000 | MEDICATED_PATCH | TRANSDERMAL | 12 refills | Status: DC

## 2022-11-26 MED ORDER — SCOPOLAMINE 1 MG/3DAYS TD PT72
1.0000 | MEDICATED_PATCH | TRANSDERMAL | Status: DC
  Administered 2022-11-26: 1.5 mg via TRANSDERMAL
  Filled 2022-11-26: qty 1

## 2022-11-26 MED ORDER — ONDANSETRON 4 MG PO TBDP: 4.0000 mg | ORAL_TABLET | Freq: Three times a day (TID) | ORAL | 1 refills | Status: DC | PRN

## 2022-11-26 NOTE — MAU Note (Signed)
.  Kaitlyn Whitaker is a 27 y.o. at [redacted]w[redacted]d here in MAU reporting: unable to keep anything down for the last two days, has been taking phenergan with minimal relief. Denies diarrhea or pain. Has thrown up at least 20x in the last 24 hrs.   Pain score: 0 Vitals:   11/26/22 1001  BP: 110/81  Pulse: 79  Resp: 14  Temp: 97.7 F (36.5 C)  SpO2: 100%     FHT:n/a Lab orders placed from triage:  UA

## 2022-11-26 NOTE — MAU Provider Note (Signed)
History     CSN: [id number]  Arrival date and time: 11/26/22 0944   None     Chief Complaint  Patient presents with   Emesis   Nausea   HPI Kaitlyn Whitaker is a 27 y.o. [id number] at [redacted]w[redacted]d who presents to MAU for nausea and vomiting. Patient reports vomiting since early pregnancy however over the past few days it has worsened. She reports despite taking Promethazine as prescribed she has not been able to keep anything down. She is reporting approximately 20 episodes of vomiting during the last 24 hours. She also reports intermittent heartburn/burning in chest with vomiting. She last took her Promethazine at 0810 this morning which she was able to keep down. She reports a history of hyperemesis gravidarum with her last pregnancy and the only thing that helped her was Zofran and sea bands. Patient denies vaginal bleeding or abdominal/pelvic pain.  Patient is scheduled for NOB at [facility].   OB History     Gravida  5   Para  2   Term  2   Preterm      AB  2   Living  2      SAB  2   IAB      Ectopic      Multiple  0   Live Births  2           Past Medical History:  Diagnosis Date   Anemia    Back pain    Pt states chronic back pain after fall in 2016   Depression    feeling balanced, no problems   GERD (gastroesophageal reflux disease)     Past Surgical History:  Procedure Laterality Date   NASAL SINUS SURGERY     TONSILLECTOMY      Family History  Problem Relation Age of Onset   Anxiety disorder Mother    Atrial fibrillation Mother    Pulmonary embolism Mother    Anemia Mother    Anxiety disorder Father    Hypertension Father    Hypothyroidism Father    Diabetes Maternal Uncle     Social History   Tobacco Use   Smoking status: Former    Types: Cigarettes    Quit date: 01/13/2016    Years since quitting: 6.8    Passive exposure: Never   Smokeless tobacco: Never  Vaping Use   Vaping Use: Never used  Substance Use Topics   Alcohol use:  Not Currently    Comment: Occasional beer 2 times a month   Drug use: Not Currently    Types: Marijuana    Comment: 4 years ago    Allergies: No Known Allergies  No medications prior to admission.   Review of Systems  Constitutional: Negative.   Gastrointestinal:  Positive for nausea and vomiting.  All other systems reviewed and are negative.  Physical Exam   Blood pressure 101/65, pulse 69, temperature 97.7 F (36.5 C), temperature source Oral, resp. rate 14, height 5' 4.5" (1.638 m), weight 47.3 kg, last menstrual period 09/29/2022, SpO2 100 %, unknown if currently breastfeeding.  Physical Exam Vitals and nursing note reviewed.  Constitutional:      General: She is not in acute distress. Eyes:     Extraocular Movements: Extraocular movements intact.     Pupils: Pupils are equal, round, and reactive to light.  Cardiovascular:     Rate and Rhythm: Normal rate.  Pulmonary:     Effort: Pulmonary effort is normal. No respiratory distress.  Abdominal:  Palpations: Abdomen is soft.     Tenderness: There is no abdominal tenderness.  Musculoskeletal:        General: Normal range of motion.     Cervical back: Normal range of motion.  Skin:    General: Skin is warm and dry.  Neurological:     General: No focal deficit present.     Mental Status: She is alert and oriented to person, place, and time.  Psychiatric:        Mood and Affect: Mood normal.        Behavior: Behavior normal.    Results for orders placed or performed during the hospital encounter of 11/26/22 (from the past 24 hour(s))  Urinalysis, Routine w reflex microscopic -Urine, Clean Catch     Status: Abnormal   Collection Time: 11/26/22 10:11 AM  Result Value Ref Range   Color, Urine AMBER (A) YELLOW   APPearance HAZY (A) CLEAR   Specific Gravity, Urine 1.027 1.005 - 1.030   pH 6.0 5.0 - 8.0   Glucose, UA NEGATIVE NEGATIVE mg/dL   Hgb urine dipstick NEGATIVE NEGATIVE   Bilirubin Urine SMALL (A)  NEGATIVE   Ketones, ur 80 (A) NEGATIVE mg/dL   Protein, ur 30 (A) NEGATIVE mg/dL   Nitrite NEGATIVE NEGATIVE   Leukocytes,Ua NEGATIVE NEGATIVE   RBC / HPF 0-5 0 - 5 RBC/hpf   WBC, UA 0-5 0 - 5 WBC/hpf   Bacteria, UA RARE (A) NONE SEEN   Squamous Epithelial / HPF 6-10 0 - 5 /HPF   Mucus PRESENT     MAU Course  Procedures  MDM UA Zofran ODT, Scopolamine patch, Pepcid PO  Patient not actively vomiting in MAU. I discussed R/B/A to Zofran in first trimester and current research. She opted for Zofran given that it helped during her last pregnancy. She was given Zofran, Pepcid, and had a scopolamine patch placed. She tolerated both water and crackers without any vomiting occurrences.   Assessment and Plan   1. [redacted] weeks gestation of pregnancy   2. Nausea and vomiting during pregnancy prior to [redacted] weeks gestation    - Discharge home in stable condition - Rx for Zofran, Pepcid, and Scopolamine patches given - Return precautions given. Return to MAU as needed - Keep OB appointment as scheduled   Renee Harder, CNM 11/26/2022, 12:19 PM

## 2022-11-26 NOTE — Telephone Encounter (Signed)
Patient left a voice message this am she keeps vomiting and vomiting and cannot keep anything down, not even water. States she was prescribed Phenergan and it is not helping. States is second day she hasn't keep anything down and feels like she is dehydrated. I called and she confirmed this information and that she is feeling dizzy, lightheaded and " out of it" . Advised to go to [facility] MAU for evaluation and possible IV fluids. She voices understanding. Staci Acosta

## 2022-12-03 ENCOUNTER — Other Ambulatory Visit: Payer: Self-pay

## 2022-12-10 ENCOUNTER — Telehealth: Payer: 59

## 2022-12-11 ENCOUNTER — Telehealth: Payer: 59

## 2022-12-11 ENCOUNTER — Telehealth (INDEPENDENT_AMBULATORY_CARE_PROVIDER_SITE_OTHER): Payer: 59

## 2022-12-11 DIAGNOSIS — Z3490 Encounter for supervision of normal pregnancy, unspecified, unspecified trimester: Secondary | ICD-10-CM | POA: Insufficient documentation

## 2022-12-11 DIAGNOSIS — F32A Depression, unspecified: Secondary | ICD-10-CM | POA: Insufficient documentation

## 2022-12-11 DIAGNOSIS — Z3492 Encounter for supervision of normal pregnancy, unspecified, second trimester: Secondary | ICD-10-CM

## 2022-12-11 DIAGNOSIS — J019 Acute sinusitis, unspecified: Secondary | ICD-10-CM | POA: Insufficient documentation

## 2022-12-11 DIAGNOSIS — Z3689 Encounter for other specified antenatal screening: Secondary | ICD-10-CM

## 2022-12-11 NOTE — Patient Instructions (Signed)
Safe Medications in Pregnancy   Acne:  Benzoyl Peroxide  Salicylic Acid   Backache/Headache:  Tylenol: 2 regular strength every 4 hours OR               2 Extra strength every 6 hours   Colds/Coughs/Allergies:  Benadryl (alcohol free) 25 mg every 6 hours as needed  Breath right strips  Claritin  Cepacol throat lozenges  Chloraseptic throat spray  Cold-Eeze- up to three times per day  Cough drops, alcohol free  Flonase (by prescription only)  Guaifenesin  Mucinex  Robitussin DM (plain only, alcohol free)  Saline nasal spray/drops  Sudafed (pseudoephedrine) & Actifed * use only after [redacted] weeks gestation and if you do not have high blood pressure  Tylenol  Vicks Vaporub  Zinc lozenges  Zyrtec   Constipation:  Colace  Ducolax suppositories  Fleet enema  Glycerin suppositories  Metamucil  Milk of magnesia  Miralax  Senokot  Smooth move tea   Diarrhea:  Kaopectate  Imodium A-D   *NO pepto Bismol   Hemorrhoids:  Anusol  Anusol HC  Preparation H  Tucks   Indigestion:  Tums  Maalox  Mylanta  Zantac  Pepcid   Insomnia:  Benadryl (alcohol free) 25mg every 6 hours as needed  Tylenol PM  Unisom, no Gelcaps   Leg Cramps:  Tums  MagGel   Nausea/Vomiting:  Bonine  Dramamine  Emetrol  Ginger extract  Sea bands  Meclizine  Nausea medication to take during pregnancy:  Unisom (doxylamine succinate 25 mg tablets) Take one tablet daily at bedtime. If symptoms are not adequately controlled, the dose can be increased to a maximum recommended dose of two tablets daily (1/2 tablet in the morning, 1/2 tablet mid-afternoon and one at bedtime).  Vitamin B6 100mg tablets. Take one tablet twice a day (up to 200 mg per day).   Skin Rashes:  Aveeno products  Benadryl cream or 25mg every 6 hours as needed  Calamine Lotion  1% cortisone cream   Yeast infection:  Gyne-lotrimin 7  Monistat 7    **If taking multiple medications, please check labels to avoid  duplicating the same active ingredients  **take medication as directed on the label  ** Do not exceed 4000 mg of tylenol in 24 hours  **Do not take medications that contain aspirin or ibuprofen             Considering Waterbirth? Guide for patients at [facility]) Why consider waterbirth? Gentle birth for babies  Less pain medicine used in labor  May allow for passive descent/less pushing  May reduce perineal tears  More mobility and instinctive maternal position changes  Increased maternal relaxation   Is waterbirth safe? What are the risks of infection, drowning or other complications? Infection:  Very low risk (3.7 % for tub vs 4.8% for bed)  7 in 8000 waterbirths with documented infection  Poorly cleaned equipment most common cause  Slightly lower group B strep transmission rate  Drowning  Maternal:  Very low risk  Related to seizures or fainting  Newborn:  Very low risk. No evidence of increased risk of respiratory problems in multiple large studies  Physiological protection from breathing under water  Avoid underwater birth if there are any fetal complications  Once baby's head is out of the water, keep it out.  Birth complication  Some reports of cord trauma, but risk decreased by bringing baby to surface gradually  No evidence of increased risk of shoulder dystocia.   Mothers can usually change positions faster in water than in a bed, possibly aiding the maneuvers to free the shoulder.   There are 2 things you MUST do to have a waterbirth with [facility]: Attend a waterbirth class at [facility]   3rd Wednesday of every month from 7-9 pm (virtual during COVID) Free Register online at [email or web address] or [email or web address] or by calling [phone] Bring us the certificate from the class to your prenatal appointment or send via MyChart Meet with a midwife at 36 weeks* to see if you can still plan a  waterbirth and to sign the consent.   *We also recommend that you schedule as many of your prenatal visits with a midwife as possible.    Helpful information: You may want to bring a bathing suit top to the hospital to wear during labor but this is optional.  All other supplies are provided by the hospital. Please arrive at the hospital with signs of active labor, and do not wait at home until late in labor. It takes 45 min- 1 hour for fetal monitoring, and check in to your room to take place, plus transport and filling of the waterbirth tub.    Things that would prevent you from having a waterbirth: Premature, <37wks  Previous cesarean birth  Presence of thick meconium-stained fluid  Multiple gestation (Twins, triplets, etc.)  Uncontrolled diabetes or gestational diabetes requiring medication  Hypertension diagnosed in pregnancy or preexisting hypertension (gestational hypertension, preeclampsia, or chronic hypertension) Fetal growth restriction (your baby measures less than 10th percentile on ultrasound) Heavy vaginal bleeding  Non-reassuring fetal heart rate  Active infection (MRSA, etc.). Group B Strep is NOT a contraindication for waterbirth.  If your labor has to be induced and induction method requires continuous monitoring of the baby's heart rate  Other risks/issues identified by your obstetrical provider   Please remember that birth is unpredictable. Under certain unforeseeable circumstances your provider may advise against giving birth in the tub. These decisions will be made on a case-by-case basis and with the safety of you and your baby as our highest priority.     

## 2022-12-11 NOTE — Progress Notes (Signed)
New OB Intake  I connected with Kaitlyn Whitaker  on 12/11/22 at  8:15 AM EDT by MyChart Video Visit and verified that I am speaking with the correct person using two identifiers. Nurse is located at [facility] and pt is located at her office.  I discussed the limitations, risks, security and privacy concerns of performing an evaluation and management service by telephone and the availability of in person appointments. I also discussed with the patient that there may be a patient responsible charge related to this service. The patient expressed understanding and agreed to proceed.  I explained I am completing New OB Intake today. We discussed EDD of 07/06/2023 that is based on LMP of 09/29/2022. Pt is G5/P2. I reviewed her allergies, medications, Medical/Surgical/OB history, and appropriate screenings. I informed her of [facility] services. [facility] information placed in AVS. Based on history, this is a low risk pregnancy.  Patient Active Problem List   Diagnosis Date Noted   Depression 12/11/2022   Pregnancy at early stage 10/02/2022   Anxiety state 02/09/2021   Bipolar 2 disorder 06/28/2019    Concerns addressed today  Delivery Plans Plans to deliver at [facility]. Patient given information for [facility] Healthy Baby website for more information about [facility]. Patient is interested in water birth. Offered upcoming OB visit with CNM to discuss further.  MyChart/Babyscripts MyChart access verified. I explained pt will have some visits in office and some virtually. Babyscripts instructions given and order placed. Patient verifies receipt of registration text/e-mail. Account successfully created and app downloaded.  Blood Pressure Cuff/Weight Scale Patient has private insurance; instructed to purchase blood pressure cuff and bring to first prenatal appt. Explained after first prenatal appt pt will check weekly and document in Babyscripts.   Anatomy US Explained first scheduled [place] will be  around 19 weeks. Anatomy US scheduled for 02/10/2023 at 1:30pm. Pt notified to arrive at 1:45pm.  Labs Discussed Avelina Laine genetic screening with patient. Would like both Panorama and Horizon drawn at new OB visit. Routine prenatal labs needed.  COVID Vaccine Patient has had COVID vaccine.   Is patient a CenteringPregnancy candidate?  Declined Declined due to Schedule   Is patient a Mom+Baby Combined Care candidate?  Not a candidate   If accepted, Mom+Baby staff notified  Social Determinants of Health Food Insecurity: Patient denies food insecurity. WIC Referral: Patient is interested in referral to [facility].  Transportation: Patient denies transportation needs. Childcare: Discussed no children allowed at ultrasound appointments. Offered childcare services; patient declines childcare services at this time.  Interested in [place]? If yes, send referral and doula dot phrase.   First visit review I reviewed new OB appt with patient. I explained they will have a provider visit that includes new ob labs and listening to baby. Explained pt will be seen by Dr. Nobie Putnam at first visit; encounter routed to appropriate provider. Explained that patient will be seen by pregnancy navigator following visit with provider.   Lowry Bowl, CMA 12/11/2022  8:06 AM
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
## 2022-12-17 ENCOUNTER — Other Ambulatory Visit: Payer: Self-pay

## 2022-12-17 ENCOUNTER — Other Ambulatory Visit (HOSPITAL_COMMUNITY)
Admission: RE | Admit: 2022-12-17 | Discharge: 2022-12-17 | Disposition: A | Discharge status: Home / Self Care | Payer: 59 | Source: Ambulatory Visit | Attending: Family Medicine | Admitting: Family Medicine

## 2022-12-17 ENCOUNTER — Ambulatory Visit (INDEPENDENT_AMBULATORY_CARE_PROVIDER_SITE_OTHER): Payer: Self-pay | Admitting: Family Medicine

## 2022-12-17 VITALS — Wt 107.6 lb

## 2022-12-17 DIAGNOSIS — Z3A11 11 weeks gestation of pregnancy: Secondary | ICD-10-CM

## 2022-12-17 DIAGNOSIS — Z3402 Encounter for supervision of normal first pregnancy, second trimester: Secondary | ICD-10-CM | POA: Diagnosis not present

## 2022-12-17 DIAGNOSIS — Z3481 Encounter for supervision of other normal pregnancy, first trimester: Secondary | ICD-10-CM | POA: Insufficient documentation

## 2022-12-17 DIAGNOSIS — Z8759 Personal history of other complications of pregnancy, childbirth and the puerperium: Secondary | ICD-10-CM

## 2022-12-17 DIAGNOSIS — Z31430 Encounter of female for testing for genetic disease carrier status for procreative management: Secondary | ICD-10-CM | POA: Diagnosis not present

## 2022-12-17 NOTE — Progress Notes (Signed)
Subjective:   Kaitlyn Whitaker is a 27 y.o. [id number] at [redacted]w[redacted]d by LMP being seen today for her first obstetrical visit.  Her obstetrical history is significant for pre-eclampsia. Patient does intend to breast feed. Pregnancy history fully reviewed.  Patient reports no bleeding, no contractions, no cramping, and no leaking.  HISTORY: OB History  Gravida Para Term Preterm AB Living  0 2 2  SAB IAB Ectopic Multiple Live Births  2 0 0 0 2    # Outcome Date GA Lbr Len/2nd Weight Sex Delivery Anes PTL Lv  5 Current           4 Term 02/10/19 [redacted]w[redacted]d 05:53 / 00:12 7 lb 2.6 oz (3.249 kg) F Vag-Spont EPI  LIV     Name: MALIYAH, WILLETS     Apgar1: 6  Apgar5: 8  3 Term 10/15/16 [redacted]w[redacted]d 17:23 / 00:56 7 lb 6.8 oz (3.368 kg) M Vag-Spont EPI  LIV     Birth Comments: wnl     Name: Galik,BOY Camryn     Apgar1: 8  Apgar5: 9  2 SAB           1 SAB            Last pap smear was  07/30/2022 and was normal Past Medical History:  Diagnosis Date   Anemia    Anxiety    Back pain    Pt states chronic back pain after fall in 2016   Depression    feeling balanced, no problems   GERD (gastroesophageal reflux disease)    Past Surgical History:  Procedure Laterality Date   NASAL SINUS SURGERY     TONSILLECTOMY     Family History  Problem Relation Age of Onset   Anxiety disorder Mother    Atrial fibrillation Mother    Pulmonary embolism Mother    Anemia Mother    Anxiety disorder Father    Hypertension Father    Hypothyroidism Father    Diabetes Maternal Uncle    Social History   Tobacco Use   Smoking status: Former    Types: Cigarettes    Quit date: 01/13/2016    Years since quitting: 6.9    Passive exposure: Never   Smokeless tobacco: Never  Vaping Use   Vaping Use: Never used  Substance Use Topics   Alcohol use: Not Currently    Comment: Occasional beer 2 times a month   Drug use: Not Currently    Types: Marijuana    Comment: 4 years ago   No Known  Allergies Current Outpatient Medications on File Prior to Visit  Medication Sig Dispense Refill   famotidine (PEPCID) 20 MG tablet TAKE 1 TABLET BY MOUTH TWICE A DAY 180 tablet 1   ondansetron (ZOFRAN-ODT) 4 MG disintegrating tablet Take 1 tablet (4 mg total) by mouth every 8 (eight) hours as needed for nausea or vomiting. 15 tablet 1   Prenatal Vit-Fe Fumarate-FA (PRENATAL 19) 29-1 MG CHEW Chew by mouth. OTC     promethazine (PHENERGAN) 25 MG tablet Take 1 tablet (25 mg total) by mouth every 6 (six) hours as needed for nausea or vomiting. 30 tablet 0   scopolamine (TRANSDERM-SCOP) 1 MG/3DAYS Place 1 patch (1.5 mg total) onto the skin every 3 (three) days. 10 patch 12   albuterol (PROAIR HFA) 108 (90 Base) MCG/ACT inhaler  (Patient not taking: Reported on 12/11/2022)     ARIPiprazole (ABILIFY) 2 MG tablet Take 2 tablets (4 mg  total) by mouth at bedtime. 180 tablet 0   DULoxetine (CYMBALTA) 60 MG capsule Take 2 capsules (120 mg total) by mouth daily. (Patient not taking: Reported on 11/04/2022) 180 capsule 1   phenazopyridine (PYRIDIUM) 200 MG tablet      predniSONE (DELTASONE) 20 MG tablet      No current facility-administered medications on file prior to visit.     Exam   There were no vitals filed for this visit.    Uterus:     Pelvic Exam: Perineum: no hemorrhoids, normal perineum   Vulva: normal external genitalia, no lesions   Vagina:  normal mucosa, normal discharge   Cervix: no lesions and normal, pap smear done.    Adnexa: normal adnexa and no mass, fullness, tenderness   Bony Pelvis: average  System: General: well-developed, well-nourished female in no acute distress   Breast:  normal appearance, no masses or tenderness   Skin: normal coloration and turgor, no rashes   Neurologic: oriented, normal, negative, normal mood   Extremities: normal strength, tone, and muscle mass, ROM of all joints is normal   HEENT PERRLA, extraocular movement intact and sclera clear, anicteric    Mouth/Teeth mucous membranes moist, pharynx normal without lesions and dental hygiene good   Neck supple and no masses   Cardiovascular: regular rate and rhythm   Respiratory:  no respiratory distress, normal breath sounds   Abdomen: soft, non-tender; bowel sounds normal; no masses,  no organomegaly     Assessment:   Pregnancy: [id number] Patient Active Problem List   Diagnosis Date Noted   Depression 12/11/2022   Supervision of normal pregnancy 12/11/2022   Pregnancy at early stage 10/02/2022   Anxiety state 02/09/2021   Bipolar 2 disorder 06/28/2019     Plan:  1. Encounter for supervision of other normal pregnancy in first trimester Continue routine prenatal care Follow-up in 4 weeks  2. History of preeclampsia  Recommended daily ASA starting at 12 weeks.  Prescription sent to patient's pharmacy   Initial labs drawn. Continue prenatal vitamins. Genetic Screening discussed, First trimester screen, Quad screen, and NIPS: ordered. Ultrasound discussed; fetal anatomic survey:  Will need to be scheduled at next visit . Problem list reviewed and updated. The nature of [facility] with multiple MDs and other Advanced Practice Providers was explained to patient; also emphasized that residents, students are part of our team. Routine obstetric precautions reviewed. No follow-ups on file.

## 2022-12-18 ENCOUNTER — Telehealth: Payer: 59 | Admitting: Psychiatry

## 2022-12-18 LAB — CBC/D/PLT+RPR+RH+ABO+RUBIGG...
Antibody Screen: NEGATIVE
Basophils Absolute: 0 10*3/uL (ref 0.0–0.2)
Basos: 0 %
EOS (ABSOLUTE): 0.1 10*3/uL (ref 0.0–0.4)
Eos: 1 %
HCV Ab: NONREACTIVE
HIV Screen 4th Generation wRfx: NONREACTIVE
Hematocrit: 35.8 % (ref 34.0–46.6)
Hemoglobin: 12.5 g/dL (ref 11.1–15.9)
Hepatitis B Surface Ag: NEGATIVE
Immature Grans (Abs): 0 10*3/uL (ref 0.0–0.1)
Immature Granulocytes: 0 %
Lymphocytes Absolute: 2 10*3/uL (ref 0.7–3.1)
Lymphs: 26 %
MCH: 31 pg (ref 26.6–33.0)
MCHC: 34.9 g/dL (ref 31.5–35.7)
MCV: 89 fL (ref 79–97)
Monocytes Absolute: 0.6 10*3/uL (ref 0.1–0.9)
Monocytes: 8 %
Neutrophils Absolute: 4.8 10*3/uL (ref 1.4–7.0)
Neutrophils: 65 %
Platelets: 277 10*3/uL (ref 150–450)
RBC: 4.03 x10E6/uL (ref 3.77–5.28)
RDW: 13 % (ref 11.7–15.4)
RPR Ser Ql: NONREACTIVE
Rh Factor: POSITIVE
Rubella Antibodies, IGG: 3.11 index (ref >0.99)
WBC: 7.5 10*3/uL (ref 3.4–10.8)

## 2022-12-18 LAB — GC/CHLAMYDIA PROBE AMP (~~LOC~~) NOT AT ARMC
Chlamydia: NEGATIVE
Comment: NEGATIVE
Comment: NORMAL
Neisseria Gonorrhea: NEGATIVE

## 2022-12-18 LAB — CMP14+EGFR
ALT: 8 IU/L (ref 0–32)
AST: 15 IU/L (ref 0–40)
Albumin/Globulin Ratio: 1.7 (ref 1.2–2.2)
Albumin: 4.4 g/dL (ref 4.0–5.0)
Alkaline Phosphatase: 64 IU/L (ref 44–121)
BUN/Creatinine Ratio: 20 (ref 9–23)
BUN: 12 mg/dL (ref 6–20)
Bilirubin Total: 0.5 mg/dL (ref 0.0–1.2)
CO2: 19 mmol/L — ABNORMAL LOW (ref 20–29)
Calcium: 9.5 mg/dL (ref 8.7–10.2)
Chloride: 100 mmol/L (ref 96–106)
Creatinine, Ser: 0.59 mg/dL (ref 0.57–1.00)
Globulin, Total: 2.6 g/dL (ref 1.5–4.5)
Glucose: 88 mg/dL (ref 70–99)
Potassium: 4 mmol/L (ref 3.5–5.2)
Sodium: 136 mmol/L (ref 134–144)
Total Protein: 7 g/dL (ref 6.0–8.5)
eGFR: 127 mL/min/{1.73_m2} (ref >59)

## 2022-12-18 LAB — HEMOGLOBIN A1C
Est. average glucose Bld gHb Est-mCnc: 91 mg/dL
Hgb A1c MFr Bld: 4.8 % (ref 4.8–5.6)

## 2022-12-18 LAB — HCV INTERPRETATION

## 2022-12-19 LAB — CULTURE, OB URINE

## 2022-12-19 LAB — URINE CULTURE, OB REFLEX

## 2022-12-19 LAB — PROTEIN / CREATININE RATIO, URINE
Creatinine, Urine: 75.9 mg/dL
Protein, Ur: 23 mg/dL
Protein/Creat Ratio: 303 mg/g creat — ABNORMAL HIGH (ref 0–200)

## 2022-12-21 MED ORDER — ASPIRIN 81 MG PO TBEC: 81.0000 mg | DELAYED_RELEASE_TABLET | Freq: Every day | ORAL | 12 refills | Status: DC
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
## 2022-12-25 ENCOUNTER — Telehealth: Payer: Medicaid Other | Admitting: Psychiatry

## 2022-12-25 LAB — PANORAMA PRENATAL TEST FULL PANEL:PANORAMA TEST PLUS 5 ADDITIONAL MICRODELETIONS: FETAL FRACTION: 6.4

## 2022-12-25 LAB — HORIZON CUSTOM: REPORT SUMMARY: NEGATIVE

## 2022-12-26 ENCOUNTER — Telehealth (INDEPENDENT_AMBULATORY_CARE_PROVIDER_SITE_OTHER): Payer: 59 | Admitting: Psychiatry

## 2022-12-26 ENCOUNTER — Encounter: Payer: Self-pay | Admitting: Psychiatry

## 2022-12-26 DIAGNOSIS — F411 Generalized anxiety disorder: Secondary | ICD-10-CM

## 2022-12-26 DIAGNOSIS — F4310 Post-traumatic stress disorder, unspecified: Secondary | ICD-10-CM

## 2022-12-26 DIAGNOSIS — F3341 Major depressive disorder, recurrent, in partial remission: Secondary | ICD-10-CM | POA: Diagnosis not present

## 2022-12-26 DIAGNOSIS — G4700 Insomnia, unspecified: Secondary | ICD-10-CM

## 2022-12-27 ENCOUNTER — Telehealth: Payer: Self-pay | Admitting: General Practice

## 2022-12-27 ENCOUNTER — Inpatient Hospital Stay (HOSPITAL_COMMUNITY)
Admission: AD | Admit: 2022-12-27 | Discharge: 2022-12-27 | Disposition: A | Discharge status: Home / Self Care | Payer: 59 | Attending: Obstetrics and Gynecology | Admitting: Obstetrics and Gynecology

## 2022-12-27 DIAGNOSIS — O219 Vomiting of pregnancy, unspecified: Secondary | ICD-10-CM | POA: Insufficient documentation

## 2022-12-27 DIAGNOSIS — R519 Headache, unspecified: Secondary | ICD-10-CM | POA: Diagnosis not present

## 2022-12-27 DIAGNOSIS — R0789 Other chest pain: Secondary | ICD-10-CM | POA: Insufficient documentation

## 2022-12-27 DIAGNOSIS — O99351 Diseases of the nervous system complicating pregnancy, first trimester: Secondary | ICD-10-CM | POA: Insufficient documentation

## 2022-12-27 DIAGNOSIS — O26891 Other specified pregnancy related conditions, first trimester: Secondary | ICD-10-CM | POA: Diagnosis not present

## 2022-12-27 DIAGNOSIS — Z3A12 12 weeks gestation of pregnancy: Secondary | ICD-10-CM | POA: Diagnosis not present

## 2022-12-27 LAB — COMPREHENSIVE METABOLIC PANEL
ALT: 11 U/L (ref 0–44)
AST: 17 U/L (ref 15–41)
Albumin: 3.3 g/dL — ABNORMAL LOW (ref 3.5–5.0)
Alkaline Phosphatase: 45 U/L (ref 38–126)
Anion gap: 10 (ref 5–15)
BUN: 6 mg/dL (ref 6–20)
CO2: 21 mmol/L — ABNORMAL LOW (ref 22–32)
Calcium: 8.7 mg/dL — ABNORMAL LOW (ref 8.9–10.3)
Chloride: 102 mmol/L (ref 98–111)
Creatinine, Ser: 0.67 mg/dL (ref 0.44–1.00)
GFR, Estimated: >60 mL/min (ref >60)
Glucose, Bld: 88 mg/dL (ref 70–99)
Potassium: 3.8 mmol/L (ref 3.5–5.1)
Sodium: 133 mmol/L — ABNORMAL LOW (ref 135–145)
Total Bilirubin: 1 mg/dL (ref 0.3–1.2)
Total Protein: 6.2 g/dL — ABNORMAL LOW (ref 6.5–8.1)

## 2022-12-27 LAB — CBC
HCT: 33 % — ABNORMAL LOW (ref 36.0–46.0)
Hemoglobin: 12 g/dL (ref 12.0–15.0)
MCH: 31.2 pg (ref 26.0–34.0)
MCHC: 36.4 g/dL — ABNORMAL HIGH (ref 30.0–36.0)
MCV: 85.7 fL (ref 80.0–100.0)
Platelets: 209 10*3/uL (ref 150–400)
RBC: 3.85 MIL/uL — ABNORMAL LOW (ref 3.87–5.11)
RDW: 12.2 % (ref 11.5–15.5)
WBC: 7.2 10*3/uL (ref 4.0–10.5)
nRBC: 0 % (ref 0.0–0.2)

## 2022-12-27 LAB — URINALYSIS, ROUTINE W REFLEX MICROSCOPIC
Bilirubin Urine: NEGATIVE
Glucose, UA: NEGATIVE mg/dL
Ketones, ur: 20 mg/dL — AB
Leukocytes,Ua: NEGATIVE
Nitrite: NEGATIVE
Protein, ur: NEGATIVE mg/dL
Specific Gravity, Urine: 1.02 (ref 1.005–1.030)
pH: 5 (ref 5.0–8.0)

## 2022-12-27 LAB — TROPONIN I (HIGH SENSITIVITY): Troponin I (High Sensitivity): <2 ng/L (ref <18)

## 2022-12-27 MED ORDER — ACETAMINOPHEN 500 MG PO TABS
1000.0000 mg | ORAL_TABLET | Freq: Once | ORAL | Status: AC
  Administered 2022-12-27: 1000 mg via ORAL
  Filled 2022-12-27: qty 2

## 2022-12-27 NOTE — Telephone Encounter (Signed)
Called patient back and asked more about her symptoms. She reports the chest pain is a dull ache in the center of her chest with a pressure sensation. She has shortness of breath even with sitting and resting. The headache is a constant 3-4 despite rest and tylenol. Recommended she go to MAU for evaluation. Patient verbalized understanding. MAU called & notified.

## 2022-12-27 NOTE — MAU Note (Signed)
Pt called from lobby, weighed and taken to rm.  Pt almost ran to unit doors.  No SOB or apparent distress.

## 2022-12-27 NOTE — MAU Note (Signed)
...  Kaitlyn Whitaker is a 27 y.o. at [redacted]w[redacted]d here in MAU reporting: N/V, HA, SOB, and chest pain. She reports the N/V has been ongoing her entire pregnancy. She reports she has a rx for Zofran and Scopolamine patches but reports her nausea has not ceased today. She reports she has had 6-7 episodes of emesis today and last vomited around 1100 this morning. She reports the SOB and chest pain have been intermittent for the past 4-5 days. She reports she feels as if she cannot take deep breaths. Regarding her chest pain, she reports the pain in her chest she feels is in the middle of her chest and at times she experiences a sharp pain in her right breast. She reports her HA began yesterday and is anterior. She reports she took 325 mg of Tylenol and this "slightly" relieved her HA. Last took Tylenol earlier this morning. Denies VB or LOF.    Last took Zofran around 1100 this morning.  Pain score:  5/10 HA  FHT: 160 doppler Lab orders placed from triage:  UA

## 2022-12-27 NOTE — MAU Provider Note (Cosign Needed Addendum)
History     [id number]  Arrival date and time: 12/27/22 1347    Chief Complaint  Patient presents with   Nausea   Emesis   Headache   Chest Pain     HPI Kaitlyn Whitaker is a 27 y.o. [id number] at [redacted]w[redacted]d by LMP who presents for 4-5 day hx of chest pressure, SOB, HA, and persistent N/V.  Chest pressure is described as midline with occasional sharp pains in R chest. No radiation to shoulders or back. Not associated with exertion. Usually not pleuritic, but patient does endorse occasionally feeling SOB as a result of her CP. Currently, the patient denies feeling any chest pain/pressure or SOB. Denies leg swelling, recent travel. Distant history of asthma as a child but otherwise no hx of lung disease. No personal history of blood clots or PE.  HA is frontal, not sudden onset. Tried taking 325mg  Tylenol x1 earlier today with minimal relief. +Hx of PreE in previous pregnancy. Checks her BP at home, has noticed SBP in the 110s-120s which is higher than her reported baseline but nothing above 130. Not on any BP meds. Denies vision changes, RUQ pain, dysuria, weakness.  N/V is chronic during this pregnancy but worsened this morning. Multiple episodes of emesis today. She typically uses Zofran and scopolamine patches. After taking Zofran this morning around 1100, her nausea improved and she was able to keep down some water. Has not tried eating anything else today. No recent illness, fevers, sick contacts.  PNC @ [facility]  No CTX, LOF, VB. Possible FM recently   B/Positive/-- (04/16 1637)  OB History     Gravida  5   Para  2   Term  2   Preterm      AB  2   Living  2      SAB  2   IAB      Ectopic      Multiple  0   Live Births  2           Past Medical History:  Diagnosis Date   Anemia    Anxiety    Back pain    Pt states chronic back pain after fall in 2016   Depression    feeling balanced, no problems   GERD (gastroesophageal reflux disease)     Past  Surgical History:  Procedure Laterality Date   NASAL SINUS SURGERY     TONSILLECTOMY      Family History  Problem Relation Age of Onset   Anxiety disorder Mother    Atrial fibrillation Mother    Pulmonary embolism Mother    Anemia Mother    Anxiety disorder Father    Hypertension Father    Hypothyroidism Father    Diabetes Maternal Uncle     Social History   Socioeconomic History   Marital status: Single    Spouse name: Not on file   Number of children: 1   Years of education: 75   Highest education level: 12th grade  Occupational History   Not on file  Tobacco Use   Smoking status: Former    Types: Cigarettes    Quit date: 01/13/2016    Years since quitting: 6.9    Passive exposure: Never   Smokeless tobacco: Never  Vaping Use   Vaping Use: Never used  Substance and Sexual Activity   Alcohol use: Not Currently    Comment: Occasional beer 2 times a month   Drug use: Not Currently    Types:  Marijuana    Comment: 4 years ago   Sexual activity: Yes    Partners: Male  Other Topics Concern   Not on file  Social History Narrative   Not on file   Social Determinants of Health   Financial Resource Strain: Not on file  Food Insecurity: No Food Insecurity (12/18/2022)   Hunger Vital Sign    Worried About Running Out of Food in the Last Year: Never true    Ran Out of Food in the Last Year: Never true  Transportation Needs: No Transportation Needs (12/18/2022)   PRAPARE - Administrator, Civil Service (Medical): No    Lack of Transportation (Non-Medical): No  Physical Activity: Not on file  Stress: Not on file  Social Connections: Not on file  Intimate Partner Violence: Not on file    No Known Allergies  No current facility-administered medications on file prior to encounter.   Current Outpatient Medications on File Prior to Encounter  Medication Sig Dispense Refill   ARIPiprazole (ABILIFY) 2 MG tablet Take 2 tablets (4 mg total) by mouth at  bedtime. 180 tablet 0   aspirin EC 81 MG tablet Take 1 tablet (81 mg total) by mouth daily. Swallow whole. 30 tablet 12   famotidine (PEPCID) 20 MG tablet TAKE 1 TABLET BY MOUTH TWICE A DAY 180 tablet 1   ondansetron (ZOFRAN-ODT) 4 MG disintegrating tablet Take 1 tablet (4 mg total) by mouth every 8 (eight) hours as needed for nausea or vomiting. 15 tablet 1   Prenatal Vit-Fe Fumarate-FA (PRENATAL 19) 29-1 MG CHEW Chew by mouth. OTC     promethazine (PHENERGAN) 25 MG tablet Take 1 tablet (25 mg total) by mouth every 6 (six) hours as needed for nausea or vomiting. 30 tablet 0   scopolamine (TRANSDERM-SCOP) 1 MG/3DAYS Place 1 patch (1.5 mg total) onto the skin every 3 (three) days. 10 patch 12   albuterol (PROAIR HFA) 108 (90 Base) MCG/ACT inhaler  (Patient not taking: Reported on 12/11/2022)     DULoxetine (CYMBALTA) 60 MG capsule Take 2 capsules (120 mg total) by mouth daily. (Patient not taking: Reported on 11/04/2022) 180 capsule 1   phenazopyridine (PYRIDIUM) 200 MG tablet      predniSONE (DELTASONE) 20 MG tablet        ROS Pertinent positives and negative per HPI, all others reviewed and negative  Physical Exam   BP 111/69   Pulse 68   Temp 98.8 F (37.1 C) (Oral)   Resp 15   Ht 5\' 5"  (1.651 m)   Wt 47 kg   LMP 09/29/2022 (Approximate)   SpO2 100%   BMI 17.24 kg/m   Patient Vitals for the past 24 hrs:  BP Temp Temp src Pulse Resp SpO2 Height Weight  12/27/22 1700 111/69 -- -- 68 -- -- -- --  12/27/22 1451 105/69 -- -- 79 -- -- -- --  12/27/22 1406 119/83 98.8 F (37.1 C) Oral (!) 101 15 100 % -- --  12/27/22 1358 -- -- -- -- -- -- 5\' 5"  (1.651 m) 47 kg    Physical Exam Constitutional:      General: She is not in acute distress. HENT:     Mouth/Throat:     Mouth: Mucous membranes are moist.     Pharynx: Oropharynx is clear.  Eyes:     Extraocular Movements: Extraocular movements intact.  Cardiovascular:     Rate and Rhythm: Normal rate and regular rhythm.     Heart  sounds: Normal heart sounds. No murmur heard. Pulmonary:     Effort: Pulmonary effort is normal. No respiratory distress.     Breath sounds: Normal breath sounds. No wheezing, rhonchi or rales.  Chest:     Chest wall: No tenderness.  Abdominal:     Tenderness: There is no abdominal tenderness. There is no guarding.  Musculoskeletal:        General: No swelling or tenderness.     Right lower leg: No tenderness. No edema.     Left lower leg: No tenderness. No edema.  Skin:    Capillary Refill: Capillary refill takes less than 2 seconds.     Findings: No erythema or rash.  Neurological:     General: No focal deficit present.     Mental Status: She is alert.     Motor: No weakness.      Labs Results for orders placed or performed during the hospital encounter of 12/27/22 (from the past 24 hour(s))  Urinalysis, Routine w reflex microscopic -Urine, Clean Catch     Status: Abnormal   Collection Time: 12/27/22  2:24 PM  Result Value Ref Range   Color, Urine YELLOW YELLOW   APPearance HAZY (A) CLEAR   Specific Gravity, Urine 1.020 1.005 - 1.030   pH 5.0 5.0 - 8.0   Glucose, UA NEGATIVE NEGATIVE mg/dL   Hgb urine dipstick SMALL (A) NEGATIVE   Bilirubin Urine NEGATIVE NEGATIVE   Ketones, ur 20 (A) NEGATIVE mg/dL   Protein, ur NEGATIVE NEGATIVE mg/dL   Nitrite NEGATIVE NEGATIVE   Leukocytes,Ua NEGATIVE NEGATIVE   RBC / HPF 0-5 0 - 5 RBC/hpf   WBC, UA 0-5 0 - 5 WBC/hpf   Bacteria, UA RARE (A) NONE SEEN   Squamous Epithelial / HPF 6-10 0 - 5 /HPF   Mucus PRESENT   CBC     Status: Abnormal   Collection Time: 12/27/22  3:12 PM  Result Value Ref Range   WBC 7.2 4.0 - 10.5 K/uL   RBC 3.85 (L) 3.87 - 5.11 MIL/uL   Hemoglobin 12.0 12.0 - 15.0 g/dL   HCT 16.1 (L) 09.6 - 04.5 %   MCV 85.7 80.0 - 100.0 fL   MCH 31.2 26.0 - 34.0 pg   MCHC 36.4 (H) 30.0 - 36.0 g/dL   RDW 40.9 81.1 - 91.4 %   Platelets 209 150 - 400 K/uL   nRBC 0.0 0.0 - 0.2 %  Comprehensive metabolic panel     Status:  Abnormal   Collection Time: 12/27/22  3:12 PM  Result Value Ref Range   Sodium 133 (L) 135 - 145 mmol/L   Potassium 3.8 3.5 - 5.1 mmol/L   Chloride 102 98 - 111 mmol/L   CO2 21 (L) 22 - 32 mmol/L   Glucose, Bld 88 70 - 99 mg/dL   BUN 6 6 - 20 mg/dL   Creatinine, Ser 7.82 0.44 - 1.00 mg/dL   Calcium 8.7 (L) 8.9 - 10.3 mg/dL   Total Protein 6.2 (L) 6.5 - 8.1 g/dL   Albumin 3.3 (L) 3.5 - 5.0 g/dL   AST 17 15 - 41 U/L   ALT 11 0 - 44 U/L   Alkaline Phosphatase 45 38 - 126 U/L   Total Bilirubin 1.0 0.3 - 1.2 mg/dL   GFR, Estimated >95 >62 mL/min   Anion gap 10 5 - 15  Troponin I (High Sensitivity)     Status: None   Collection Time: 12/27/22  3:12 PM  Result  Value Ref Range   Troponin I (High Sensitivity) <2 <18 ng/L   EKG NSR  Imaging No results found.  MAU Course  Procedures Lab Orders         Urinalysis, Routine w reflex microscopic -Urine, Clean Catch         CBC         Comprehensive metabolic panel    Meds ordered this encounter  Medications   acetaminophen (TYLENOL) tablet 1,000 mg   Imaging Orders  No imaging studies ordered today    MDM moderate  Assessment and Plan   1. Pregnancy headache in first trimester   2. Nausea/vomiting in pregnancy   3. Chest pressure   4. [redacted] weeks gestation of pregnancy    EKG unremarkable NSR with no significant ST changes, bloodwork including CBC, CMP, UA, troponin unremarkable. No anemia or significant electrolyte disturbance. No evidence of DVT on exam, vitals wnl. Symptoms largely resolved aside from headache.  Low suspicion for ACS given normal EKG, troponin. Low suspicion for PE, Well's score 0, normal O2 sats, cardiopulmonary exam unremarkable, no indication to obtain D-dimer.   N/V currently resolved, patient does not desire treatment for nausea right now.  Provided Tylenol x1 for headache with improvement.  Dispo: discharged to home in stable condition, w/ return precautions   Discharge Instructions      Discharge patient   Complete by: As directed    Discharge disposition: 01-Home or Self Care   Discharge patient date: 12/27/2022       Vonna Drafts, MD 12/27/22 5:28 PM  Allergies as of 12/27/2022   No Known Allergies      Medication List     TAKE these medications    ARIPiprazole 2 MG tablet Commonly known as: ABILIFY Take 2 tablets (4 mg total) by mouth at bedtime.   aspirin EC 81 MG tablet Take 1 tablet (81 mg total) by mouth daily. Swallow whole.   DULoxetine 60 MG capsule Commonly known as: CYMBALTA Take 2 capsules (120 mg total) by mouth daily.   famotidine 20 MG tablet Commonly known as: PEPCID TAKE 1 TABLET BY MOUTH TWICE A DAY   ondansetron 4 MG disintegrating tablet Commonly known as: ZOFRAN-ODT Take 1 tablet (4 mg total) by mouth every 8 (eight) hours as needed for nausea or vomiting.   phenazopyridine 200 MG tablet Commonly known as: PYRIDIUM   predniSONE 20 MG tablet Commonly known as: DELTASONE   Prenatal 19 29-1 MG Chew Chew by mouth. OTC   ProAir HFA 108 (90 Base) MCG/ACT inhaler Generic drug: albuterol   promethazine 25 MG tablet Commonly known as: PHENERGAN Take 1 tablet (25 mg total) by mouth every 6 (six) hours as needed for nausea or vomiting.   scopolamine 1 MG/3DAYS Commonly known as: TRANSDERM-SCOP Place 1 patch (1.5 mg total) onto the skin every 3 (three) days.           Attestation of Supervision of Student:  I confirm that I have verified the information documented in the resitdent's note.  I have verified that all services and findings are accurately documented in this resident's note; and I agree with management and plan as outlined in the documentation. I have also made any necessary editorial changes.  FHT present via doppler  Judeth Horn, NP Center for [facility], [facility] 12/27/2022 5:28 PM

## 2022-12-27 NOTE — Telephone Encounter (Signed)
Patient called and left message on nurse voicemail line stating she has been having problems with nausea/vomiting this pregnancy but has medications she has been taking. She reports the problem recently has been chest pain in the center of her chest, shortness of breathe, constant headaches and she hasn't been able to keep anything down this morning even with her medications. Patient states she knows she has high protein in her urine already and has been monitoring her blood pressure at home and while it is still normal, it is higher than it normally is. She reports a history of preeclampsia. Patient would like to know if there is something else she could take to get relief as she has already started the baby aspirin.   Called patient, no answer- left message to call us back

## 2023-01-14 ENCOUNTER — Encounter: Payer: 59 | Admitting: Medical

## 2023-01-14 ENCOUNTER — Other Ambulatory Visit: Payer: Self-pay

## 2023-01-14 ENCOUNTER — Ambulatory Visit (INDEPENDENT_AMBULATORY_CARE_PROVIDER_SITE_OTHER): Payer: 59 | Admitting: Family Medicine

## 2023-01-14 VITALS — BP 115/81 | HR 91 | Wt 112.1 lb

## 2023-01-14 DIAGNOSIS — Z3A15 15 weeks gestation of pregnancy: Secondary | ICD-10-CM

## 2023-01-14 DIAGNOSIS — F3181 Bipolar II disorder: Secondary | ICD-10-CM

## 2023-01-14 DIAGNOSIS — F32A Depression, unspecified: Secondary | ICD-10-CM

## 2023-01-14 DIAGNOSIS — Z3402 Encounter for supervision of normal first pregnancy, second trimester: Secondary | ICD-10-CM | POA: Diagnosis not present

## 2023-01-14 DIAGNOSIS — R110 Nausea: Secondary | ICD-10-CM

## 2023-01-14 MED ORDER — ONDANSETRON HCL 4 MG PO TABS: 4.0000 mg | ORAL_TABLET | Freq: Three times a day (TID) | ORAL | 0 refills | Status: DC | PRN

## 2023-01-14 NOTE — Patient Instructions (Signed)

## 2023-01-14 NOTE — Progress Notes (Signed)
   PRENATAL VISIT NOTE  Subjective:  Kaitlyn Whitaker is a 27 y.o. [id number] at [redacted]w[redacted]d being seen today for ongoing prenatal care.  She is currently monitored for the following issues for this high-risk pregnancy and has Bipolar 2 disorder (HCC); Anxiety state; Depression; Pregnancy at early stage; Supervision of normal pregnancy; and History of pre-eclampsia on their problem list.  Patient reports no complaints.  Contractions: Not present. Vag. Bleeding: None.  Movement: Absent. Denies leaking of fluid.   The following portions of the patient's history were reviewed and updated as appropriate: allergies, current medications, past family history, past medical history, past social history, past surgical history and problem list.   Objective:   Vitals:   01/14/23 1648  BP: 115/81  Pulse: 91  Weight: 112 lb 1.6 oz (50.8 kg)    Fetal Status: Fetal Heart Rate (bpm): 154   Movement: Absent     General:  Alert, oriented and cooperative. Patient is in no acute distress.  Skin: Skin is warm and dry. No rash noted.   Cardiovascular: Normal heart rate noted  Respiratory: Normal respiratory effort, no problems with respiration noted  Abdomen: Soft, gravid, appropriate for gestational age.  Pain/Pressure: Absent     Pelvic: Cervical exam deferred        Extremities: Normal range of motion.  Edema: None  Mental Status: Normal mood and affect. Normal behavior. Normal judgment and thought content.   Assessment and Plan:  Pregnancy: [id number] at [redacted]w[redacted]d  1. Encounter for supervision of normal first pregnancy in second trimester - AFP, Serum, Open Spina Bifida - ondansetron (ZOFRAN) 4 MG tablet; Take 1 tablet (4 mg total) by mouth every 8 (eight) hours as needed for nausea or vomiting.  Dispense: 20 tablet; Refill: 0  2. Bipolar 2 disorder (HCC)  3. Depression, unspecified depression type  4. Nausea - ondansetron (ZOFRAN) 4 MG tablet; Take 1 tablet (4 mg total) by mouth every 8 (eight) hours as  needed for nausea or vomiting.  Dispense: 20 tablet; Refill: 0   Pt follows with [facility] and is on abilify 2mg . She is informed about the risks of taking this medication. Given her BPD sx, benefit seems to outweigh risks and pt prefers to keep taking abilify.   Preterm labor symptoms and general obstetric precautions including but not limited to vaginal bleeding, contractions, leaking of fluid and fetal movement were reviewed in detail with the patient. Please refer to After Visit Summary for other counseling recommendations.    Future Appointments  Date Time Provider Department Center  02/05/2023  8:20 AM [name], MD [facility] None  02/10/2023  1:45 PM [facility]  02/11/2023  3:55 PM Sue Lush, FNP [facility]    Myrtie Hawk, DO [facility] Fellow, Faculty practice [facility], Center for [facility] 01/14/23  5:28 PM

## 2023-01-16 LAB — AFP, SERUM, OPEN SPINA BIFIDA
AFP MoM: 1.16
AFP Value: 41.7 ng/mL
Gest. Age on Collection Date: 15 weeks
Maternal Age At EDD: 27.4 yr
OSBR Risk 1 IN: 7366
Test Results:: NEGATIVE
Weight: 112 [lb_av]
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
## 2023-02-05 ENCOUNTER — Telehealth (INDEPENDENT_AMBULATORY_CARE_PROVIDER_SITE_OTHER): Payer: 59 | Admitting: Psychiatry

## 2023-02-05 ENCOUNTER — Encounter: Payer: Self-pay | Admitting: Psychiatry

## 2023-02-05 DIAGNOSIS — F411 Generalized anxiety disorder: Secondary | ICD-10-CM | POA: Diagnosis not present

## 2023-02-05 DIAGNOSIS — F4310 Post-traumatic stress disorder, unspecified: Secondary | ICD-10-CM | POA: Diagnosis not present

## 2023-02-05 DIAGNOSIS — F3341 Major depressive disorder, recurrent, in partial remission: Secondary | ICD-10-CM | POA: Diagnosis not present

## 2023-02-10 ENCOUNTER — Other Ambulatory Visit: Payer: Self-pay | Admitting: Family Medicine

## 2023-02-10 ENCOUNTER — Other Ambulatory Visit: Payer: Self-pay | Admitting: *Deleted

## 2023-02-10 ENCOUNTER — Ambulatory Visit: Payer: 59 | Attending: Family Medicine

## 2023-02-10 DIAGNOSIS — O358XX0 Maternal care for other (suspected) fetal abnormality and damage, not applicable or unspecified: Secondary | ICD-10-CM

## 2023-02-10 DIAGNOSIS — O0932 Supervision of pregnancy with insufficient antenatal care, second trimester: Secondary | ICD-10-CM | POA: Insufficient documentation

## 2023-02-10 DIAGNOSIS — Z3A19 19 weeks gestation of pregnancy: Secondary | ICD-10-CM | POA: Insufficient documentation

## 2023-02-10 DIAGNOSIS — Z3492 Encounter for supervision of normal pregnancy, unspecified, second trimester: Secondary | ICD-10-CM

## 2023-02-10 DIAGNOSIS — O09292 Supervision of pregnancy with other poor reproductive or obstetric history, second trimester: Secondary | ICD-10-CM | POA: Diagnosis not present

## 2023-02-10 DIAGNOSIS — O99340 Other mental disorders complicating pregnancy, unspecified trimester: Secondary | ICD-10-CM

## 2023-02-10 DIAGNOSIS — F319 Bipolar disorder, unspecified: Secondary | ICD-10-CM | POA: Diagnosis not present

## 2023-02-10 DIAGNOSIS — Z363 Encounter for antenatal screening for malformations: Secondary | ICD-10-CM | POA: Diagnosis present

## 2023-02-10 DIAGNOSIS — O99342 Other mental disorders complicating pregnancy, second trimester: Secondary | ICD-10-CM | POA: Diagnosis not present

## 2023-02-10 DIAGNOSIS — O09299 Supervision of pregnancy with other poor reproductive or obstetric history, unspecified trimester: Secondary | ICD-10-CM

## 2023-02-10 DIAGNOSIS — O09293 Supervision of pregnancy with other poor reproductive or obstetric history, third trimester: Secondary | ICD-10-CM

## 2023-02-11 ENCOUNTER — Encounter: Payer: Self-pay | Admitting: Obstetrics and Gynecology

## 2023-02-11 ENCOUNTER — Ambulatory Visit (INDEPENDENT_AMBULATORY_CARE_PROVIDER_SITE_OTHER): Payer: 59 | Admitting: Obstetrics and Gynecology

## 2023-02-11 ENCOUNTER — Other Ambulatory Visit: Payer: Self-pay

## 2023-02-11 VITALS — BP 105/71 | HR 97 | Wt 115.6 lb

## 2023-02-11 DIAGNOSIS — F3181 Bipolar II disorder: Secondary | ICD-10-CM

## 2023-02-11 DIAGNOSIS — Z3402 Encounter for supervision of normal first pregnancy, second trimester: Secondary | ICD-10-CM

## 2023-02-11 DIAGNOSIS — Z3A19 19 weeks gestation of pregnancy: Secondary | ICD-10-CM

## 2023-02-11 DIAGNOSIS — Z3492 Encounter for supervision of normal pregnancy, unspecified, second trimester: Secondary | ICD-10-CM

## 2023-02-11 DIAGNOSIS — F32A Depression, unspecified: Secondary | ICD-10-CM

## 2023-02-11 MED ORDER — VITAFOL GUMMIES 3.33-0.333-34.8 MG PO CHEW: 1.0000 | CHEWABLE_TABLET | Freq: Every day | ORAL | 5 refills | Status: DC

## 2023-02-11 NOTE — Progress Notes (Signed)
   PRENATAL VISIT NOTE  Subjective:  Kaitlyn Whitaker is a 27 y.o. [id number] at [redacted]w[redacted]d being seen today for ongoing prenatal care.  She is currently monitored for the following issues for this low-risk pregnancy and has Bipolar 2 disorder (HCC); Anxiety state; Depression; Pregnancy at early stage; Supervision of normal pregnancy; and History of pre-eclampsia on their problem list.  Patient reports no complaints.  Contractions: Not present. Vag. Bleeding: None.  Movement: Present. Denies leaking of fluid.   The following portions of the patient's history were reviewed and updated as appropriate: allergies, current medications, past family history, past medical history, past social history, past surgical history and problem list.   Objective:   Vitals:   02/11/23 1620  BP: 105/71  Pulse: 97  Weight: 115 lb 9.6 oz (52.4 kg)    Fetal Status: Fetal Heart Rate (bpm): 154   Movement: Present     General:  Alert, oriented and cooperative. Patient is in no acute distress.  Skin: Skin is warm and dry. No rash noted.   Cardiovascular: Normal heart rate noted  Respiratory: Normal respiratory effort, no problems with respiration noted  Abdomen: Soft, gravid, appropriate for gestational age.  Pain/Pressure: Absent     Pelvic: Cervical exam deferred        Extremities: Normal range of motion.  Edema: None  Mental Status: Normal mood and affect. Normal behavior. Normal judgment and thought content.   Assessment and Plan:  Pregnancy: [id number] at [redacted]w[redacted]d 1. Encounter for supervision of low-risk pregnancy in second trimester BP and FHR normal Feeling vigorous movement Anticipatory guidance regarding upcoming appts Interested in water birth, discussed requirements include water birth class and send certificate as well as seeing water birth certified provider. Discussed this does not mean she cannot change her mind but allows her the option to have water birth if she desires  6/10 u/s normal but could  not visualize everything d/t fetal position. Will follow up 7/16 2. Bipolar 2 disorder (HCC) 3. Depression, unspecified depression type Tried going off  Abilify, made things worse. Stable on Abilify  Pt follows with [facility] and is on abilify 2mg . She is informed about the risks of taking this medication at her last visit. Given her BPD sx, benefit seems to outweigh risks and pt prefers to keep taking abilify.   Preterm labor symptoms and general obstetric precautions including but not limited to vaginal bleeding, contractions, leaking of fluid and fetal movement were reviewed in detail with the patient. Please refer to After Visit Summary for other counseling recommendations.  Future Appointments  Date Time Provider Department Center  03/10/2023  3:15 PM Donna Bernard [facility]  03/18/2023  3:30 PM [facility] NURSE [facility]  03/18/2023  3:45 PM [facility] US4 [facility]  04/09/2023  8:20 AM [name], MD [facility] None     Future Appointments  Date Time Provider Department Center  03/18/2023  3:30 PM [facility] NURSE [facility]  03/18/2023  3:45 PM [facility]4 [facility]  04/09/2023  8:20 AM [name], MD [facility] None    Albertine Grates, FNP

## 2023-02-11 NOTE — Patient Instructions (Signed)

## 2023-02-13 ENCOUNTER — Other Ambulatory Visit: Payer: Self-pay

## 2023-02-13 DIAGNOSIS — Z3402 Encounter for supervision of normal first pregnancy, second trimester: Secondary | ICD-10-CM

## 2023-02-13 DIAGNOSIS — R110 Nausea: Secondary | ICD-10-CM

## 2023-02-13 MED ORDER — ONDANSETRON HCL 4 MG PO TABS: 4.0000 mg | ORAL_TABLET | Freq: Three times a day (TID) | ORAL | 0 refills | Status: DC | PRN

## 2023-02-13 NOTE — Telephone Encounter (Signed)
From: [name] To: Office of Myrtie Hawk, DO Sent: 02/12/2023 3:13 PM EDT Subject: Medication Renewal Request  Refills have been requested for the following medications:   ondansetron (ZOFRAN) 4 MG tablet Shanda Bumps Q Mercado-Ortiz]  Preferred pharmacy: [id number] - [place], [place] - [address] Delivery method: [employer]

## 2023-03-10 ENCOUNTER — Ambulatory Visit (INDEPENDENT_AMBULATORY_CARE_PROVIDER_SITE_OTHER): Payer: 59 | Admitting: Family Medicine

## 2023-03-10 ENCOUNTER — Other Ambulatory Visit: Payer: Self-pay

## 2023-03-10 VITALS — BP 109/75 | HR 106 | Wt 122.0 lb

## 2023-03-10 DIAGNOSIS — Z8759 Personal history of other complications of pregnancy, childbirth and the puerperium: Secondary | ICD-10-CM

## 2023-03-10 DIAGNOSIS — Z3A23 23 weeks gestation of pregnancy: Secondary | ICD-10-CM

## 2023-03-10 DIAGNOSIS — Z3480 Encounter for supervision of other normal pregnancy, unspecified trimester: Secondary | ICD-10-CM

## 2023-03-10 MED ORDER — ONDANSETRON 4 MG PO TBDP: 4.0000 mg | ORAL_TABLET | Freq: Three times a day (TID) | ORAL | 1 refills | Status: DC | PRN

## 2023-03-10 NOTE — Progress Notes (Signed)
   PRENATAL VISIT NOTE  Subjective:  Kaitlyn Whitaker is a 27 y.o. [id number] at [redacted]w[redacted]d being seen today for ongoing prenatal care.  She is currently monitored for the following issues for this low-risk pregnancy and has Bipolar 2 disorder (HCC); Anxiety state; Depression; Pregnancy at early stage; Supervision of normal pregnancy; and History of pre-eclampsia on their problem list.  Patient reports no complaints.   . Vag. Bleeding: None.  Movement: Present. Denies leaking of fluid.   The following portions of the patient's history were reviewed and updated as appropriate: allergies, current medications, past family history, past medical history, past social history, past surgical history and problem list.   Objective:   Vitals:   03/10/23 1520  BP: 109/75  Pulse: (!) 106  Weight: 122 lb (55.3 kg)    Fetal Status: Fetal Heart Rate (bpm): 150 Fundal Height: 23 cm Movement: Present     General:  Alert, oriented and cooperative. Patient is in no acute distress.  Skin: Skin is warm and dry. No rash noted.   Cardiovascular: Normal heart rate noted  Respiratory: Normal respiratory effort, no problems with respiration noted  Abdomen: Soft, gravid, appropriate for gestational age.  Pain/Pressure: Absent     Pelvic: Cervical exam deferred        Extremities: Normal range of motion.     Mental Status: Normal mood and affect. Normal behavior. Normal judgment and thought content.   Assessment and Plan:  Pregnancy: [id number] at [redacted]w[redacted]d 1. Supervision of other normal pregnancy, antepartum No acute concerns.   2. History of pre-eclampsia BP wnl today. Normal growth. Follow up US scheduled.   3. [redacted] weeks gestation of pregnancy Follow up in 4 weeks or sooner if needed  Preterm labor symptoms and general obstetric precautions including but not limited to vaginal bleeding, contractions, leaking of fluid and fetal movement were reviewed in detail with the patient. Please refer to After Visit Summary  for other counseling recommendations.   Return in about 4 weeks (around 04/07/2023) for LROB follow up.  Future Appointments  Date Time Provider Department Center  03/18/2023  3:30 PM [facility]  03/18/2023  3:45 PM [facility]  03/31/2023 11:15 AM Anyanwu, Jethro Bastos, MD [facility]  04/09/2023  8:20 AM [name], MD [facility] None    Lavonda Jumbo, DO

## 2023-03-18 ENCOUNTER — Ambulatory Visit: Payer: 59 | Attending: Obstetrics

## 2023-03-18 ENCOUNTER — Ambulatory Visit: Payer: 59

## 2023-03-18 ENCOUNTER — Other Ambulatory Visit: Payer: Self-pay | Admitting: Obstetrics

## 2023-03-18 VITALS — BP 111/66 | HR 116

## 2023-03-18 DIAGNOSIS — O09292 Supervision of pregnancy with other poor reproductive or obstetric history, second trimester: Secondary | ICD-10-CM | POA: Insufficient documentation

## 2023-03-18 DIAGNOSIS — O09293 Supervision of pregnancy with other poor reproductive or obstetric history, third trimester: Secondary | ICD-10-CM

## 2023-03-18 DIAGNOSIS — Z3A24 24 weeks gestation of pregnancy: Secondary | ICD-10-CM | POA: Diagnosis not present

## 2023-03-18 DIAGNOSIS — F319 Bipolar disorder, unspecified: Secondary | ICD-10-CM | POA: Diagnosis not present

## 2023-03-18 DIAGNOSIS — O99342 Other mental disorders complicating pregnancy, second trimester: Secondary | ICD-10-CM | POA: Insufficient documentation

## 2023-03-18 DIAGNOSIS — Z3402 Encounter for supervision of normal first pregnancy, second trimester: Secondary | ICD-10-CM

## 2023-03-18 DIAGNOSIS — Z8759 Personal history of other complications of pregnancy, childbirth and the puerperium: Secondary | ICD-10-CM

## 2023-03-19 ENCOUNTER — Other Ambulatory Visit: Payer: Self-pay | Admitting: *Deleted

## 2023-03-19 DIAGNOSIS — O09299 Supervision of pregnancy with other poor reproductive or obstetric history, unspecified trimester: Secondary | ICD-10-CM

## 2023-03-31 ENCOUNTER — Encounter: Payer: 59 | Admitting: Obstetrics & Gynecology
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
## 2023-04-09 ENCOUNTER — Encounter: Payer: Self-pay | Admitting: Psychiatry

## 2023-04-09 ENCOUNTER — Telehealth (INDEPENDENT_AMBULATORY_CARE_PROVIDER_SITE_OTHER): Payer: 59 | Admitting: Psychiatry

## 2023-04-09 ENCOUNTER — Encounter: Payer: 59 | Admitting: Family Medicine

## 2023-04-09 DIAGNOSIS — F4310 Post-traumatic stress disorder, unspecified: Secondary | ICD-10-CM | POA: Diagnosis not present

## 2023-04-09 DIAGNOSIS — F411 Generalized anxiety disorder: Secondary | ICD-10-CM

## 2023-04-09 DIAGNOSIS — F3341 Major depressive disorder, recurrent, in partial remission: Secondary | ICD-10-CM

## 2023-04-09 MED ORDER — DULOXETINE HCL 60 MG PO CPEP: 60.0000 mg | ORAL_CAPSULE | Freq: Every day | ORAL | 0 refills | Status: DC

## 2023-04-09 NOTE — Patient Instructions (Addendum)
Continue duloxetine 60 mg  Continue Abilify 2 mg daily   Next appointment: 10/16 at 8 20

## 2023-04-10 ENCOUNTER — Ambulatory Visit (INDEPENDENT_AMBULATORY_CARE_PROVIDER_SITE_OTHER): Payer: 59 | Admitting: Family Medicine

## 2023-04-10 ENCOUNTER — Other Ambulatory Visit: Payer: Self-pay

## 2023-04-10 VITALS — BP 112/69 | HR 90 | Wt 133.0 lb

## 2023-04-10 DIAGNOSIS — Z3403 Encounter for supervision of normal first pregnancy, third trimester: Secondary | ICD-10-CM

## 2023-04-10 DIAGNOSIS — Z3A27 27 weeks gestation of pregnancy: Secondary | ICD-10-CM

## 2023-04-10 DIAGNOSIS — Z8759 Personal history of other complications of pregnancy, childbirth and the puerperium: Secondary | ICD-10-CM

## 2023-04-10 DIAGNOSIS — F3181 Bipolar II disorder: Secondary | ICD-10-CM

## 2023-04-10 DIAGNOSIS — F32A Depression, unspecified: Secondary | ICD-10-CM

## 2023-04-10 NOTE — Progress Notes (Signed)
   PRENATAL VISIT NOTE  Subjective:  Kaitlyn Whitaker is a 27 y.o. [id number] at [redacted]w[redacted]d being seen today for ongoing prenatal care.  She is currently monitored for the following issues for this low-risk pregnancy and has Bipolar 2 disorder (HCC); Anxiety state; Depression; Supervision of normal pregnancy; and History of pre-eclampsia on their problem list.  Patient reports  dizzy spells. Feels like she will faint and that these are not related to eating, standing, hydration status Will get nauseated and sick at times..  Contractions: Not present. Vag. Bleeding: None.  Movement: Present. Denies leaking of fluid.   The following portions of the patient's history were reviewed and updated as appropriate: allergies, current medications, past family history, past medical history, past social history, past surgical history and problem list.   Objective:   Vitals:   04/10/23 1652  BP: 112/69  Pulse: 90  Weight: 133 lb (60.3 kg)    Fetal Status: Fetal Heart Rate (bpm): 137 Fundal Height: 28 cm Movement: Present     General:  Alert, oriented and cooperative. Patient is in no acute distress.  Skin: Skin is warm and dry. No rash noted.   Cardiovascular: Normal heart rate noted  Respiratory: Normal respiratory effort, no problems with respiration noted  Abdomen: Soft, gravid, appropriate for gestational age.  Pain/Pressure: Present     Pelvic: Cervical exam deferred        Extremities: Normal range of motion.  Edema: None  Mental Status: Normal mood and affect. Normal behavior. Normal judgment and thought content.   Assessment and Plan:  Pregnancy: [id number] at [redacted]w[redacted]d 1. Encounter for supervision of normal first pregnancy in third trimester Continue routine prenatal care.  2. History of pre-eclampsia On ASA BP is good today  3. Bipolar 2 disorder (HCC) On Abilify  4. Depression, unspecified depression type On Cymbalta  5. Dizzy spells ? SVT to take pulse and let me know if elevated and  possible referral to cardio/OB  Preterm labor symptoms and general obstetric precautions including but not limited to vaginal bleeding, contractions, leaking of fluid and fetal movement were reviewed in detail with the patient. Please refer to After Visit Summary for other counseling recommendations.   Return in about 2 weeks (around 04/24/2023) for [facility] & 28 wk labs plus tdap.  Future Appointments  Date Time Provider Department Center  04/25/2023  8:20 AM [facility] LAB [facility]  04/28/2023  2:30 PM [facility] NURSE [facility]  04/28/2023  2:45 PM [facility] US5 [facility]  04/28/2023  4:15 PM Kendell Bane [facility]  06/18/2023  8:20 AM [name], MD [facility] None    Reva Bores, MD

## 2023-04-25 ENCOUNTER — Other Ambulatory Visit: Payer: Self-pay

## 2023-04-25 ENCOUNTER — Other Ambulatory Visit: Payer: No Typology Code available for payment source

## 2023-04-25 DIAGNOSIS — Z3403 Encounter for supervision of normal first pregnancy, third trimester: Secondary | ICD-10-CM

## 2023-04-25 DIAGNOSIS — O99013 Anemia complicating pregnancy, third trimester: Secondary | ICD-10-CM | POA: Diagnosis not present

## 2023-04-26 LAB — RPR: RPR Ser Ql: NONREACTIVE

## 2023-04-26 LAB — CBC
Hematocrit: 31.4 % — ABNORMAL LOW (ref 34.0–46.6)
Hemoglobin: 10.1 g/dL — ABNORMAL LOW (ref 11.1–15.9)
MCH: 27.6 pg (ref 26.6–33.0)
MCHC: 32.2 g/dL (ref 31.5–35.7)
MCV: 86 fL (ref 79–97)
Platelets: 267 10*3/uL (ref 150–450)
RBC: 3.66 x10E6/uL — ABNORMAL LOW (ref 3.77–5.28)
RDW: 11.4 % — ABNORMAL LOW (ref 11.7–15.4)
WBC: 7.3 10*3/uL (ref 3.4–10.8)

## 2023-04-26 LAB — GLUCOSE TOLERANCE, 2 HOURS W/ 1HR
Glucose, 1 hour: 113 mg/dL (ref 70–179)
Glucose, 2 hour: 125 mg/dL (ref 70–152)
Glucose, Fasting: 85 mg/dL (ref 70–91)

## 2023-04-26 LAB — HIV ANTIBODY (ROUTINE TESTING W REFLEX): HIV Screen 4th Generation wRfx: NONREACTIVE

## 2023-04-28 ENCOUNTER — Encounter: Payer: No Typology Code available for payment source | Admitting: Advanced Practice Midwife

## 2023-04-28 ENCOUNTER — Other Ambulatory Visit: Payer: Self-pay | Admitting: General Practice

## 2023-04-28 ENCOUNTER — Encounter: Payer: Self-pay | Admitting: *Deleted

## 2023-04-28 ENCOUNTER — Ambulatory Visit: Payer: No Typology Code available for payment source | Attending: Maternal & Fetal Medicine | Admitting: *Deleted

## 2023-04-28 ENCOUNTER — Other Ambulatory Visit: Payer: Self-pay | Admitting: *Deleted

## 2023-04-28 ENCOUNTER — Ambulatory Visit (INDEPENDENT_AMBULATORY_CARE_PROVIDER_SITE_OTHER): Payer: No Typology Code available for payment source | Admitting: Advanced Practice Midwife

## 2023-04-28 ENCOUNTER — Ambulatory Visit: Payer: No Typology Code available for payment source

## 2023-04-28 VITALS — BP 101/65 | HR 105

## 2023-04-28 VITALS — BP 103/68 | HR 91 | Wt 140.5 lb

## 2023-04-28 DIAGNOSIS — F319 Bipolar disorder, unspecified: Secondary | ICD-10-CM | POA: Insufficient documentation

## 2023-04-28 DIAGNOSIS — F3181 Bipolar II disorder: Secondary | ICD-10-CM

## 2023-04-28 DIAGNOSIS — Z23 Encounter for immunization: Secondary | ICD-10-CM

## 2023-04-28 DIAGNOSIS — O99343 Other mental disorders complicating pregnancy, third trimester: Secondary | ICD-10-CM | POA: Diagnosis not present

## 2023-04-28 DIAGNOSIS — Z3A30 30 weeks gestation of pregnancy: Secondary | ICD-10-CM | POA: Insufficient documentation

## 2023-04-28 DIAGNOSIS — O09293 Supervision of pregnancy with other poor reproductive or obstetric history, third trimester: Secondary | ICD-10-CM | POA: Diagnosis not present

## 2023-04-28 DIAGNOSIS — Z8759 Personal history of other complications of pregnancy, childbirth and the puerperium: Secondary | ICD-10-CM

## 2023-04-28 DIAGNOSIS — O99340 Other mental disorders complicating pregnancy, unspecified trimester: Secondary | ICD-10-CM | POA: Insufficient documentation

## 2023-04-28 DIAGNOSIS — O09299 Supervision of pregnancy with other poor reproductive or obstetric history, unspecified trimester: Secondary | ICD-10-CM

## 2023-04-28 DIAGNOSIS — Z3403 Encounter for supervision of normal first pregnancy, third trimester: Secondary | ICD-10-CM

## 2023-04-28 DIAGNOSIS — O99013 Anemia complicating pregnancy, third trimester: Secondary | ICD-10-CM

## 2023-04-28 MED ORDER — FERROUS SULFATE 325 (65 FE) MG PO TABS: 325.0000 mg | ORAL_TABLET | ORAL | 3 refills | Status: DC

## 2023-04-28 MED ORDER — OB COMPLETE PETITE 35-5-1-200 MG PO CAPS: 1.0000 | ORAL_CAPSULE | Freq: Every day | ORAL | 10 refills | Status: DC

## 2023-04-28 NOTE — Progress Notes (Signed)
   PRENATAL VISIT NOTE  Subjective:  Kaitlyn Whitaker is a 27 y.o. N4O2703 at [redacted]w[redacted]d being seen today for ongoing prenatal care.  She is currently monitored for the following issues for this low-risk pregnancy and has Bipolar 2 disorder (HCC); Anxiety state; Depression; Supervision of normal pregnancy; and History of pre-eclampsia on their problem list.  Patient reports no complaints.  Contractions: Irritability. Vag. Bleeding: None.  Movement: Present. Denies leaking of fluid.   The following portions of the patient's history were reviewed and updated as appropriate: allergies, current medications, past family history, past medical history, past social history, past surgical history and problem list.   Objective:   Vitals:   04/28/23 1601  BP: 103/68  Pulse: 91  Weight: 140 lb 8 oz (63.7 kg)    Fetal Status: Fetal Heart Rate (bpm): 150 Fundal Height: 30 cm Movement: Present     General:  Alert, oriented and cooperative. Patient is in no acute distress.  Skin: Skin is warm and dry. No rash noted.   Cardiovascular: Normal heart rate noted  Respiratory: Normal respiratory effort, no problems with respiration noted  Abdomen: Soft, gravid, appropriate for gestational age.  Pain/Pressure: Present     Pelvic: Cervical exam deferred        Extremities: Normal range of motion.  Edema: Mild pitting, slight indentation  Mental Status: Normal mood and affect. Normal behavior. Normal judgment and thought content.   Assessment and Plan:  Pregnancy: [id number] at [redacted]w[redacted]d 1. Encounter for supervision of normal first pregnancy in third trimester --Anticipatory guidance about next visits/weeks of pregnancy given.  - Pt is interested in waterbirth.  No contraindications at this time per chart review/patient assessment.   - Pt has taken class, see CNMs for most visits in the office.  - Discussed waterbirth as option for low-risk pregnancy.  Reviewed conditions that may arise during pregnancy that will  risk pt out of waterbirth including hypertension, diabetes, fetal growth restriction <10%ile, etc.  - Tdap vaccine greater than or equal to 7yo IM  2. History of pre-eclampsia -- With first pregnancy, no HTN with second pregnancy  3. Bipolar 2 disorder (HCC) --stable on medications, sees BH  4. [redacted] weeks gestation of pregnancy   Preterm labor symptoms and general obstetric precautions including but not limited to vaginal bleeding, contractions, leaking of fluid and fetal movement were reviewed in detail with the patient. Please refer to After Visit Summary for other counseling recommendations.   Return in about 2 days (around 04/30/2023) for Midwife preferred.  Future Appointments  Date Time Provider Department Center  05/20/2023  3:55 PM Sissy Hoff [facility]  06/02/2023  2:15 PM [facility] NURSE [facility]  06/02/2023  2:30 PM [facility] US3 [facility]  06/03/2023  4:15 PM Carlynn Herald, CNM [facility]  06/18/2023  8:20 AM [name], MD [facility] None    Sharen Counter, CNM

## 2023-04-29 ENCOUNTER — Ambulatory Visit: Payer: 59

## 2023-05-01 LAB — ANEMIA PROFILE B
Ferritin: 7 ng/mL — ABNORMAL LOW (ref 15–150)
Folate: 13 ng/mL (ref >3.0)
Iron Saturation: 9 % — CL (ref 15–55)
Iron: 63 ug/dL (ref 27–159)
Total Iron Binding Capacity: 687 ug/dL (ref 250–450)
UIBC: 624 ug/dL — ABNORMAL HIGH (ref 131–425)
Vitamin B-12: 159 pg/mL — ABNORMAL LOW (ref 232–1245)

## 2023-05-01 LAB — SPECIMEN STATUS REPORT

## 2023-05-05 ENCOUNTER — Other Ambulatory Visit: Payer: Self-pay | Admitting: Psychiatry

## 2023-05-07 DIAGNOSIS — Z0289 Encounter for other administrative examinations: Secondary | ICD-10-CM

## 2023-05-12 ENCOUNTER — Telehealth: Payer: Self-pay

## 2023-05-12 NOTE — Telephone Encounter (Signed)
VM left reporting the following symptoms occurred yesterday at work: lightheaded, vision blurring, chest hurting, feeling like she might pass out. This lasted for about 1 hours. BP was checked yesterday by RN at [facility] where patient works and was found to be low. Reports history of pre-e and is taking aspirin 81 mg. Called pt 05/12/23 and VM left.   Called pt 05/13/23 and reached pt. Reports BP during episode was approx 90/65. Encouraged pt to increase hydration as she is able and to add more electrolytes. She is currently drinking 3-5 16 oz water bottles each day and likes to drink a Gatorade most days. Encouraged her that this is a great routine, her body just has increased demand due to pregnancy. Also encouraged her to eat every 2-3 hours to be sure this was not due to low blood sugar. Also reviewed Pepcid and Tums for reported heartburn. Instructed pt to present to MAU if she experiences another episode like this.

## 2023-05-19 ENCOUNTER — Encounter: Payer: No Typology Code available for payment source | Admitting: Obstetrics and Gynecology

## 2023-05-20 ENCOUNTER — Other Ambulatory Visit: Payer: Self-pay

## 2023-05-20 ENCOUNTER — Ambulatory Visit: Payer: No Typology Code available for payment source | Admitting: Certified Nurse Midwife

## 2023-05-20 VITALS — BP 112/72 | HR 82 | Wt 147.0 lb

## 2023-05-20 DIAGNOSIS — F3181 Bipolar II disorder: Secondary | ICD-10-CM

## 2023-05-20 DIAGNOSIS — Z3A33 33 weeks gestation of pregnancy: Secondary | ICD-10-CM

## 2023-05-20 DIAGNOSIS — Z3493 Encounter for supervision of normal pregnancy, unspecified, third trimester: Secondary | ICD-10-CM

## 2023-05-20 DIAGNOSIS — R42 Dizziness and giddiness: Secondary | ICD-10-CM

## 2023-05-20 DIAGNOSIS — R232 Flushing: Secondary | ICD-10-CM

## 2023-05-20 DIAGNOSIS — R519 Headache, unspecified: Secondary | ICD-10-CM

## 2023-05-20 NOTE — Progress Notes (Signed)
   PRENATAL VISIT NOTE  Subjective:  Kaitlyn Whitaker is a 27 y.o. [id number] at [redacted]w[redacted]d being seen today for ongoing prenatal care.  She is currently monitored for the following issues for this low-risk pregnancy and has Bipolar 2 disorder (HCC); Anxiety state; Depression; Supervision of normal pregnancy; and History of pre-eclampsia on their problem list.  Patient reports  increased heart rate 150-160's, dizziness, blurred vision and headaches which occur randomly .  Contractions: Irritability. Vag. Bleeding: None.  Movement: Present. Denies leaking of fluid.   The following portions of the patient's history were reviewed and updated as appropriate: allergies, current medications, past family history, past medical history, past social history, past surgical history and problem list.   Objective:   Vitals:   05/20/23 1700  BP: 112/72  Pulse: 82  Weight: 66.7 kg    Fetal Status: Fetal Heart Rate (bpm): 150 Fundal Height: 31 cm Movement: Present     General:  Alert, oriented and cooperative. Patient is in no acute distress.  Skin: Skin is warm and dry. No rash noted.   Cardiovascular: Normal heart rate noted  Respiratory: Normal respiratory effort, no problems with respiration noted  Abdomen: Soft, gravid, appropriate for gestational age.  Pain/Pressure: Present     Pelvic: Cervical exam deferred        Extremities: Normal range of motion.  Edema: None  Mental Status: Normal mood and affect. Normal behavior. Normal judgment and thought content.   Assessment and Plan:  Pregnancy: [id number] at [redacted]w[redacted]d 1. Encounter for supervision of low-risk pregnancy in third trimester -Feeling well today. Reports vigorous fetal movement  -Discussed waterbirth. No contraindications at this time. Patient will submit her certificate. 2. [redacted] weeks gestation of pregnancy  Routine OB care   3. Bipolar 2 disorder (HCC) Stable on her medications takes Abilify   4. Hot flashes  ?SVT vs Anemia - AMB Referral  to Cardio Obstetrics - CBC   5. Dizziness ?SVT vs Anemia - AMB Referral to Cardio Obstetrics - CBC  6. Frequent headaches  ?SVT vs Anemia - AMB Referral to Cardio Obstetrics - CBC  Preterm labor symptoms and general obstetric precautions including but not limited to vaginal bleeding, contractions, leaking of fluid and fetal movement were reviewed in detail with the patient. Please refer to After Visit Summary for other counseling recommendations.   Return in 2 weeks (on 06/03/2023) for LOB.  Future Appointments  Date Time Provider Department Center  05/22/2023  2:30 PM [facility]  06/02/2023  2:15 PM [facility] NURSE [facility]  06/02/2023  2:30 PM [facility] US3 [facility]  06/03/2023  4:15 PM Carlynn Herald, CNM [facility]  06/18/2023  8:20 AM [name], MD [facility] None    Cleda Mccreedy, Student-MidWife

## 2023-05-22 ENCOUNTER — Other Ambulatory Visit: Payer: Self-pay

## 2023-05-22 ENCOUNTER — Other Ambulatory Visit: Payer: No Typology Code available for payment source

## 2023-05-22 DIAGNOSIS — O99013 Anemia complicating pregnancy, third trimester: Secondary | ICD-10-CM

## 2023-05-22 DIAGNOSIS — Z8759 Personal history of other complications of pregnancy, childbirth and the puerperium: Secondary | ICD-10-CM

## 2023-05-23 LAB — ANEMIA PROFILE B
Basophils Absolute: 0 10*3/uL (ref 0.0–0.2)
Basos: 0 %
EOS (ABSOLUTE): 0.3 10*3/uL (ref 0.0–0.4)
Eos: 4 %
Ferritin: 8 ng/mL — ABNORMAL LOW (ref 15–150)
Folate: 14.1 ng/mL (ref >3.0)
Hematocrit: 28.5 % — ABNORMAL LOW (ref 34.0–46.6)
Hemoglobin: 9.2 g/dL — ABNORMAL LOW (ref 11.1–15.9)
Immature Grans (Abs): 0.1 10*3/uL (ref 0.0–0.1)
Immature Granulocytes: 1 %
Iron Saturation: 5 % — CL (ref 15–55)
Iron: 28 ug/dL (ref 27–159)
Lymphocytes Absolute: 2.1 10*3/uL (ref 0.7–3.1)
Lymphs: 24 %
MCH: 26.2 pg — ABNORMAL LOW (ref 26.6–33.0)
MCHC: 32.3 g/dL (ref 31.5–35.7)
MCV: 81 fL (ref 79–97)
Monocytes Absolute: 0.8 10*3/uL (ref 0.1–0.9)
Monocytes: 10 %
Neutrophils Absolute: 5.3 10*3/uL (ref 1.4–7.0)
Neutrophils: 61 %
Platelets: 250 10*3/uL (ref 150–450)
RBC: 3.51 x10E6/uL — ABNORMAL LOW (ref 3.77–5.28)
RDW: 12.4 % (ref 11.7–15.4)
Retic Ct Pct: 2.3 % (ref 0.6–2.6)
Total Iron Binding Capacity: 598 ug/dL (ref 250–450)
UIBC: 570 ug/dL — ABNORMAL HIGH (ref 131–425)
Vitamin B-12: 170 pg/mL — ABNORMAL LOW (ref 232–1245)
WBC: 8.7 10*3/uL (ref 3.4–10.8)

## 2023-05-27 ENCOUNTER — Telehealth: Payer: Self-pay

## 2023-05-27 ENCOUNTER — Encounter: Payer: Self-pay | Admitting: Family Medicine

## 2023-05-27 ENCOUNTER — Encounter: Payer: Self-pay | Admitting: *Deleted

## 2023-05-27 DIAGNOSIS — O99013 Anemia complicating pregnancy, third trimester: Secondary | ICD-10-CM | POA: Insufficient documentation

## 2023-05-27 NOTE — Telephone Encounter (Signed)
Auth Submission: NO AUTH NEEDED Site of care: Site of care: [facility] Payer: Arts development officer and [place] Medicaid Medication & CPT/J Code(s) submitted: Venofer (Iron Sucrose) J1756 Route of submission (phone, fax, portal): portal Phone # Fax # Auth type: Buy/Bill PB Units/visits requested: 200mg  x 5 doses Reference number:  Approval from: 05/27/23 to 09/02/23   Confirmed on the Availity portal that Venofer does not need a prior authorization through her Engineer, building services. [place] Medicaid does not require a prior authorization for Venofer.

## 2023-05-28 ENCOUNTER — Ambulatory Visit (INDEPENDENT_AMBULATORY_CARE_PROVIDER_SITE_OTHER): Payer: No Typology Code available for payment source

## 2023-05-28 VITALS — BP 96/65 | HR 70 | Temp 98.2°F | Resp 18 | Ht 64.5 in | Wt 148.4 lb

## 2023-05-28 DIAGNOSIS — D508 Other iron deficiency anemias: Secondary | ICD-10-CM | POA: Diagnosis not present

## 2023-05-28 DIAGNOSIS — O99013 Anemia complicating pregnancy, third trimester: Secondary | ICD-10-CM | POA: Diagnosis not present

## 2023-05-28 DIAGNOSIS — Z3A34 34 weeks gestation of pregnancy: Secondary | ICD-10-CM

## 2023-05-28 MED ORDER — ACETAMINOPHEN 325 MG PO TABS
650.0000 mg | ORAL_TABLET | Freq: Once | ORAL | Status: AC
  Administered 2023-05-28: 650 mg via ORAL
  Filled 2023-05-28: qty 2

## 2023-05-28 MED ORDER — DIPHENHYDRAMINE HCL 25 MG PO CAPS
25.0000 mg | ORAL_CAPSULE | Freq: Once | ORAL | Status: AC
  Administered 2023-05-28: 25 mg via ORAL
  Filled 2023-05-28: qty 1

## 2023-05-28 MED ORDER — SODIUM CHLORIDE 0.9 % IV SOLN
200.0000 mg | Freq: Once | INTRAVENOUS | Status: AC
  Administered 2023-05-28: 200 mg via INTRAVENOUS
  Filled 2023-05-28: qty 10

## 2023-05-28 NOTE — Progress Notes (Signed)
Diagnosis: Iron Deficiency Anemia  Provider:  Chilton Greathouse MD  Procedure: IV Infusion  IV Type: Peripheral, IV Location: L Forearm  Venofer (Iron Sucrose), Dose: 200 mg  Infusion Start Time: 1003  Infusion Stop Time: 1020  Post Infusion IV Care: Observation period completed and Peripheral IV Discontinued  Discharge: Condition: Good, Destination: Home . AVS Provided  Performed by:  Garnette Czech, RN

## 2023-05-30 ENCOUNTER — Ambulatory Visit (INDEPENDENT_AMBULATORY_CARE_PROVIDER_SITE_OTHER): Payer: No Typology Code available for payment source

## 2023-05-30 VITALS — BP 107/66 | HR 88 | Temp 98.2°F | Resp 16 | Ht 64.0 in | Wt 148.8 lb

## 2023-05-30 DIAGNOSIS — O99013 Anemia complicating pregnancy, third trimester: Secondary | ICD-10-CM

## 2023-05-30 DIAGNOSIS — D508 Other iron deficiency anemias: Secondary | ICD-10-CM

## 2023-05-30 DIAGNOSIS — Z3A34 34 weeks gestation of pregnancy: Secondary | ICD-10-CM

## 2023-05-30 MED ORDER — ACETAMINOPHEN 325 MG PO TABS
650.0000 mg | ORAL_TABLET | Freq: Once | ORAL | Status: AC
  Administered 2023-05-30: 650 mg via ORAL
  Filled 2023-05-30: qty 2

## 2023-05-30 MED ORDER — DIPHENHYDRAMINE HCL 25 MG PO CAPS
25.0000 mg | ORAL_CAPSULE | Freq: Once | ORAL | Status: AC
  Administered 2023-05-30: 25 mg via ORAL
  Filled 2023-05-30: qty 1

## 2023-05-30 MED ORDER — SODIUM CHLORIDE 0.9 % IV SOLN
200.0000 mg | Freq: Once | INTRAVENOUS | Status: AC
  Administered 2023-05-30: 200 mg via INTRAVENOUS
  Filled 2023-05-30: qty 10

## 2023-05-30 NOTE — Progress Notes (Signed)
Diagnosis: Iron Deficiency Anemia  Provider:  Chilton Greathouse MD  Procedure: IV Infusion  IV Type: Peripheral, IV Location: L Forearm  Venofer (Iron Sucrose), Dose: 200 mg  Infusion Start Time: 1538  Infusion Stop Time: 1555  Post Infusion IV Care: Patient declined observation and Peripheral IV Discontinued  Discharge: Condition: Good, Destination: Home . AVS Declined  Performed by:  Rico Ala, LPN

## 2023-06-02 ENCOUNTER — Encounter: Payer: Self-pay | Admitting: Cardiology

## 2023-06-02 ENCOUNTER — Ambulatory Visit (INDEPENDENT_AMBULATORY_CARE_PROVIDER_SITE_OTHER): Payer: No Typology Code available for payment source

## 2023-06-02 ENCOUNTER — Encounter: Payer: Self-pay | Admitting: *Deleted

## 2023-06-02 ENCOUNTER — Ambulatory Visit (INDEPENDENT_AMBULATORY_CARE_PROVIDER_SITE_OTHER): Payer: No Typology Code available for payment source | Admitting: Cardiology

## 2023-06-02 ENCOUNTER — Encounter: Payer: No Typology Code available for payment source | Admitting: Obstetrics & Gynecology

## 2023-06-02 ENCOUNTER — Ambulatory Visit: Payer: No Typology Code available for payment source | Admitting: *Deleted

## 2023-06-02 ENCOUNTER — Ambulatory Visit: Payer: Medicaid Other | Attending: Obstetrics

## 2023-06-02 VITALS — BP 114/74 | HR 115

## 2023-06-02 VITALS — BP 100/80 | HR 82 | Ht 64.0 in | Wt 143.0 lb

## 2023-06-02 DIAGNOSIS — R002 Palpitations: Secondary | ICD-10-CM | POA: Diagnosis not present

## 2023-06-02 DIAGNOSIS — O09293 Supervision of pregnancy with other poor reproductive or obstetric history, third trimester: Secondary | ICD-10-CM | POA: Diagnosis not present

## 2023-06-02 DIAGNOSIS — Z7982 Long term (current) use of aspirin: Secondary | ICD-10-CM | POA: Insufficient documentation

## 2023-06-02 DIAGNOSIS — R000 Tachycardia, unspecified: Secondary | ICD-10-CM | POA: Insufficient documentation

## 2023-06-02 DIAGNOSIS — O99343 Other mental disorders complicating pregnancy, third trimester: Secondary | ICD-10-CM | POA: Diagnosis not present

## 2023-06-02 DIAGNOSIS — R0789 Other chest pain: Secondary | ICD-10-CM

## 2023-06-02 DIAGNOSIS — Z3403 Encounter for supervision of normal first pregnancy, third trimester: Secondary | ICD-10-CM

## 2023-06-02 DIAGNOSIS — Z8759 Personal history of other complications of pregnancy, childbirth and the puerperium: Secondary | ICD-10-CM

## 2023-06-02 DIAGNOSIS — Z136 Encounter for screening for cardiovascular disorders: Secondary | ICD-10-CM | POA: Diagnosis not present

## 2023-06-02 DIAGNOSIS — O99013 Anemia complicating pregnancy, third trimester: Secondary | ICD-10-CM | POA: Diagnosis not present

## 2023-06-02 DIAGNOSIS — O212 Late vomiting of pregnancy: Secondary | ICD-10-CM | POA: Diagnosis not present

## 2023-06-02 DIAGNOSIS — Z3A35 35 weeks gestation of pregnancy: Secondary | ICD-10-CM

## 2023-06-02 DIAGNOSIS — O99891 Other specified diseases and conditions complicating pregnancy: Secondary | ICD-10-CM | POA: Insufficient documentation

## 2023-06-02 DIAGNOSIS — Z362 Encounter for other antenatal screening follow-up: Secondary | ICD-10-CM | POA: Diagnosis present

## 2023-06-02 DIAGNOSIS — F319 Bipolar disorder, unspecified: Secondary | ICD-10-CM | POA: Diagnosis not present

## 2023-06-02 DIAGNOSIS — R55 Syncope and collapse: Secondary | ICD-10-CM | POA: Diagnosis not present

## 2023-06-02 NOTE — Progress Notes (Unsigned)
ZIO XT serial # [id number] from office inventory applied to patient.

## 2023-06-02 NOTE — Progress Notes (Signed)
Cardio-Obstetrics Clinic  New Evaluation  Date:  06/02/2023   ID:  Kaitlyn Whitaker, DOB 1996-01-27, MRN [id number]  PCP:  Celedonio Savage, MD   [facility] Providers Cardiologist:  Thomasene Ripple, DO  Electrophysiologist:  None       Referring MD: Lowella Curb*   Chief Complaint: "   History of Present Illness:    Kaitlyn Whitaker is a 27 y.o. female [G5P2022] who is being seen today for the evaluation of chest pain and palpiations at the request of Carlynn Herald, C*.   The chest discomfort, initially attributed to heartburn, is not relieved by heartburn medication. The palpitations are characterized by a heart rate that drops to the 60s at rest but increases to 130s-150s with minimal activity such as walking. The elevated heart rate persists as long as the patient is active and only decreases when the patient is at rest. The patient also reports episodes of dizziness, blurry vision, and headaches since the first trimester. These episodes occur sporadically and are not necessarily associated with the palpitations. The patient has a history of anemia and is currently receiving iron infusions.  Prior CV Studies Reviewed: The following studies were reviewed today:   Past Medical History:  Diagnosis Date   Anemia    Anxiety    Anxiety state 02/09/2021   Back pain    Pt states chronic back pain after fall in 2016   Depression    feeling balanced, no problems   GERD (gastroesophageal reflux disease)     Past Surgical History:  Procedure Laterality Date   NASAL SINUS SURGERY     TONSILLECTOMY        OB History     Gravida  5   Para  2   Term  2   Preterm      AB  2   Living  2      SAB  2   IAB      Ectopic      Multiple  0   Live Births  2               Current Medications: Current Meds  Medication Sig   ARIPiprazole (ABILIFY) 2 MG tablet Take 1 tablet (2 mg total) by mouth at bedtime.   aspirin EC 81 MG tablet  Take 1 tablet (81 mg total) by mouth daily. Swallow whole.   DULoxetine (CYMBALTA) 60 MG capsule Take 1 capsule (60 mg total) by mouth daily.   famotidine (PEPCID) 20 MG tablet TAKE 1 TABLET BY MOUTH TWICE A DAY   ondansetron (ZOFRAN-ODT) 4 MG disintegrating tablet Take 1 tablet (4 mg total) by mouth every 8 (eight) hours as needed for nausea or vomiting.   Prenat-FeCbn-FeAspGl-FA-Omega (OB COMPLETE PETITE) 35-5-1-200 MG CAPS Take 1 capsule by mouth daily.     Allergies:   Patient has no known allergies.   Social History   Socioeconomic History   Marital status: Single    Spouse name: Not on file   Number of children: 1   Years of education: 51   Highest education level: 12th grade  Occupational History   Not on file  Tobacco Use   Smoking status: Former    Current packs/day: 0.00    Types: Cigarettes    Quit date: 01/13/2016    Years since quitting: 7.3    Passive exposure: Never   Smokeless tobacco: Never  Vaping Use   Vaping status: Never Used  Substance and Sexual  Activity   Alcohol use: Not Currently    Comment: Occasional beer 2 times a month   Drug use: Not Currently    Types: Marijuana    Comment: 4 years ago   Sexual activity: Yes    Partners: Male  Other Topics Concern   Not on file  Social History Narrative   Not on file   Social Determinants of Health   Financial Resource Strain: Not on file  Food Insecurity: No Food Insecurity (12/18/2022)   Hunger Vital Sign    Worried About Running Out of Food in the Last Year: Never true    Ran Out of Food in the Last Year: Never true  Transportation Needs: No Transportation Needs (12/18/2022)   PRAPARE - Administrator, Civil Service (Medical): No    Lack of Transportation (Non-Medical): No  Physical Activity: Not on file  Stress: Not on file  Social Connections: Not on file      Family History  Problem Relation Age of Onset   Anxiety disorder Mother    Atrial fibrillation Mother    Pulmonary  embolism Mother    Anemia Mother    Anxiety disorder Father    Hypertension Father    Hypothyroidism Father    Diabetes Maternal Uncle       ROS:   Please see the history of present illness.     All other systems reviewed and are negative.   Labs/EKG Reviewed:    EKG:   EKG i ordered today.  The ekg ordered today demonstrates normal sinus rhythm., HR 82 bpm  Recent Labs: 12/27/2022: ALT 11; BUN 6; Creatinine, Ser 0.67; Potassium 3.8; Sodium 133 05/22/2023: Hemoglobin 9.2; Platelets 250   Recent Lipid Panel No results found for: "CHOL", "TRIG", "HDL", "CHOLHDL", "LDLCALC", "LDLDIRECT"  Physical Exam:    VS:  BP 100/80 (BP Location: Left Arm, Patient Position: Sitting, Cuff Size: Normal)   Pulse 82   Ht 5\' 4"  (1.626 m)   Wt 143 lb (64.9 kg)   LMP 09/29/2022 (Approximate)   SpO2 99%   BMI 24.55 kg/m     Wt Readings from Last 3 Encounters:  06/02/23 143 lb (64.9 kg)  05/30/23 148 lb 12.8 oz (67.5 kg)  05/28/23 148 lb 6.4 oz (67.3 kg)     GEN:  Well nourished, well developed in no acute distress HEENT: Normal NECK: No JVD; No carotid bruits LYMPHATICS: No lymphadenopathy CARDIAC: RRR, no murmurs, rubs, gallops RESPIRATORY:  Clear to auscultation without rales, wheezing or rhonchi  ABDOMEN: Soft, non-tender, non-distended MUSCULOSKELETAL:  No edema; No deformity  SKIN: Warm and dry NEUROLOGIC:  Alert and oriented x 3 PSYCHIATRIC:  Normal affect    Risk Assessment/Risk Calculators:     CARPREG II Risk Prediction Index Score:  1.  The patient's risk for a primary cardiac event is 5%.   Modified World Health Organization [facility]) Classification of Maternal CV Risk   Class I         ASSESSMENT & PLAN:    Anemia in Pregnancy Patient reports symptoms of anemia including dizziness, blurry vision, and headaches. She is currently receiving iron infusions. -Continue iron infusions as prescribed. -Order echocardiogram to rule out high output heart  failure.  Tachycardia in Pregnancy Patient reports heart rate in the 130s-150s with activity, which decreases with rest. This is a new symptom in this pregnancy and may be related to anemia. -Order a heart monitor to assess for arrhythmias. -Follow-up in 8 weeks postpartum to reassess  heart rate.  Nausea/Vomiting in Pregnancy Patient reports difficulty keeping anything down, possibly related to iron infusions. -Recommend adding fresh lime to water to help with nausea.  Anticipated Delivery Patient is [redacted] weeks pregnant with her third child. -Plan for vaginal delivery. -Follow-up 12 weeks postpartum.   Patient Instructions  Medication Instructions:  Your physician recommends that you continue on your current medications as directed. Please refer to the Current Medication list given to you today.  *If you need a refill on your cardiac medications before your next appointment, please call your pharmacy*   Lab Work: None   Testing/Procedures: Your physician has requested that you have an echocardiogram-OB. Echocardiography is a painless test that uses sound waves to create images of your heart. It provides your doctor with information about the size and shape of your heart and how well your heart's chambers and valves are working. This procedure takes approximately one hour. There are no restrictions for this procedure. Please do NOT wear cologne, perfume, aftershave, or lotions (deodorant is allowed). Please arrive 15 minutes prior to your appointment time.   ZIO XT- Long Term Monitor Instructions  Your physician has requested you wear a ZIO patch monitor for 14 days.  This is a single patch monitor. [employer] supplies one patch monitor per enrollment. Additional stickers are not available. Please do not apply patch if you will be having a Nuclear Stress Test,  Echocardiogram, Cardiac CT, MRI, or Chest Xray during the period you would be wearing the  monitor. The patch cannot be worn  during these tests. You cannot remove and re-apply the  ZIO XT patch monitor.  Your ZIO patch monitor will be mailed 3 day USPS to your address on file. It may take 3-5 days  to receive your monitor after you have been enrolled.  Once you have received your monitor, please review the enclosed instructions. Your monitor  has already been registered assigning a specific monitor serial # to you.  Billing and Patient Assistance Program Information  We have supplied [employer] with any of your insurance information on file for billing purposes. [employer] offers a sliding scale Patient Assistance Program for patients that do not have  insurance, or whose insurance does not completely cover the cost of the ZIO monitor.  You must apply for the Patient Assistance Program to qualify for this discounted rate.  To apply, please call [employer] at [phone], select option 4, select option 2, ask to apply for  Patient Assistance Program. Meredeth Ide will ask your household income, and how many people  are in your household. They will quote your out-of-pocket cost based on that information.  [employer] will also be able to set up a 57-month, interest-free payment plan if needed.  Applying the monitor   Shave hair from upper left chest.  Hold abrader disc by orange tab. Rub abrader in 40 strokes over the upper left chest as  indicated in your monitor instructions.  Clean area with 4 enclosed alcohol pads. Let dry.  Apply patch as indicated in monitor instructions. Patch will be placed under collarbone on left  side of chest with arrow pointing upward.  Rub patch adhesive wings for 2 minutes. Remove white label marked "1". Remove the white  label marked "2". Rub patch adhesive wings for 2 additional minutes.  While looking in a mirror, press and release button in center of patch. A small green light will  flash 3-4 times. This will be your only indicator that the monitor has been turned  on.  Do not shower for the  first 24 hours. You may shower after the first 24 hours.  Press the button if you feel a symptom. You will hear a small click. Record Date, Time and  Symptom in the Patient Logbook.  When you are ready to remove the patch, follow instructions on the last 2 pages of Patient  Logbook. Stick patch monitor onto the last page of Patient Logbook.  Place Patient Logbook in the blue and white box. Use locking tab on box and tape box closed  securely. The blue and white box has prepaid postage on it. Please place it in the mailbox as  soon as possible. Your physician should have your test results approximately 7 days after the  monitor has been mailed back to [facility].  Call [facility] Customer Care at [phone] if you have questions regarding  your ZIO XT patch monitor. Call them immediately if you see an orange light blinking on your  monitor.  If your monitor falls off in less than 4 days, contact our Monitor department at [phone].  If your monitor becomes loose or falls off after 4 days call [employer] at [phone] for  suggestions on securing your monitor    Follow-Up: At [facility], you and your health needs are our priority.  As part of our continuing mission to provide you with exceptional heart care, we have created designated Provider Care Teams.  These Care Teams include your primary Cardiologist (physician) and Advanced Practice Providers (APPs -  Physician Assistants and Nurse Practitioners) who all work together to provide you with the care you need, when you need it.   Your next appointment:   12 week(s)  Provider:   Thomasene Ripple, DO        Dispo:  No follow-ups on file.   Medication Adjustments/Labs and Tests Ordered: Current medicines are reviewed at length with the patient today.  Concerns regarding medicines are outlined above.  Tests Ordered: Orders Placed This Encounter  Procedures   LONG TERM MONITOR (3-14 DAYS)   EKG 12-Lead    ECHOCARDIOGRAM COMPLETE   Medication Changes: No orders of the defined types were placed in this encounter.

## 2023-06-02 NOTE — Patient Instructions (Signed)
Medication Instructions:  Your physician recommends that you continue on your current medications as directed. Please refer to the Current Medication list given to you today.  *If you need a refill on your cardiac medications before your next appointment, please call your pharmacy*   Lab Work: None   Testing/Procedures: Your physician has requested that you have an echocardiogram - OB. Echocardiography is a painless test that uses sound waves to create images of your heart. It provides your doctor with information about the size and shape of your heart and how well your heart's chambers and valves are working. This procedure takes approximately one hour. There are no restrictions for this procedure. Please do NOT wear cologne, perfume, aftershave, or lotions (deodorant is allowed). Please arrive 15 minutes prior to your appointment time.  ZIO XT- Long Term Monitor Instructions  Your physician has requested you wear a ZIO patch monitor for 14 days.  This is a single patch monitor. [employer] supplies one patch monitor per enrollment. Additional stickers are not available. Please do not apply patch if you will be having a Nuclear Stress Test,  Echocardiogram, Cardiac CT, MRI, or Chest Xray during the period you would be wearing the  monitor. The patch cannot be worn during these tests. You cannot remove and re-apply the  ZIO XT patch monitor.  Your ZIO patch monitor will be mailed 3 day USPS to your address on file. It may take 3-5 days  to receive your monitor after you have been enrolled.  Once you have received your monitor, please review the enclosed instructions. Your monitor  has already been registered assigning a specific monitor serial # to you.  Billing and Patient Assistance Program Information  We have supplied [employer] with any of your insurance information on file for billing purposes. [employer] offers a sliding scale Patient Assistance Program for patients that do not have   insurance, or whose insurance does not completely cover the cost of the ZIO monitor.  You must apply for the Patient Assistance Program to qualify for this discounted rate.  To apply, please call [employer] at [phone], select option 4, select option 2, ask to apply for  Patient Assistance Program. Meredeth Ide will ask your household income, and how many people  are in your household. They will quote your out-of-pocket cost based on that information.  [employer] will also be able to set up a 15-month, interest-free payment plan if needed.  Applying the monitor   Shave hair from upper left chest.  Hold abrader disc by orange tab. Rub abrader in 40 strokes over the upper left chest as  indicated in your monitor instructions.  Clean area with 4 enclosed alcohol pads. Let dry.  Apply patch as indicated in monitor instructions. Patch will be placed under collarbone on left  side of chest with arrow pointing upward.  Rub patch adhesive wings for 2 minutes. Remove white label marked "1". Remove the white  label marked "2". Rub patch adhesive wings for 2 additional minutes.  While looking in a mirror, press and release button in center of patch. A small green light will  flash 3-4 times. This will be your only indicator that the monitor has been turned on.  Do not shower for the first 24 hours. You may shower after the first 24 hours.  Press the button if you feel a symptom. You will hear a small click. Record Date, Time and  Symptom in the Patient Logbook.  When you are ready to remove the  patch, follow instructions on the last 2 pages of Patient  Logbook. Stick patch monitor onto the last page of Patient Logbook.  Place Patient Logbook in the blue and white box. Use locking tab on box and tape box closed  securely. The blue and white box has prepaid postage on it. Please place it in the mailbox as  soon as possible. Your physician should have your test results approximately 7 days after the  monitor  has been mailed back to [facility].  Call [facility] Customer Care at [phone] if you have questions regarding  your ZIO XT patch monitor. Call them immediately if you see an orange light blinking on your  monitor.  If your monitor falls off in less than 4 days, contact our Monitor department at [phone].  If your monitor becomes loose or falls off after 4 days call [employer] at [phone] for  suggestions on securing your monitor   Follow-Up: At [facility], you and your health needs are our priority.  As part of our continuing mission to provide you with exceptional heart care, we have created designated Provider Care Teams.  These Care Teams include your primary Cardiologist (physician) and Advanced Practice Providers (APPs -  Physician Assistants and Nurse Practitioners) who all work together to provide you with the care you need, when you need it.   Your next appointment:   12 week(s)  Provider:   Thomasene Ripple, DO

## 2023-06-03 ENCOUNTER — Ambulatory Visit (INDEPENDENT_AMBULATORY_CARE_PROVIDER_SITE_OTHER): Payer: No Typology Code available for payment source | Admitting: Certified Nurse Midwife

## 2023-06-03 ENCOUNTER — Other Ambulatory Visit: Payer: Self-pay

## 2023-06-03 VITALS — BP 124/86 | HR 96 | Wt 147.0 lb

## 2023-06-03 DIAGNOSIS — O99013 Anemia complicating pregnancy, third trimester: Secondary | ICD-10-CM

## 2023-06-03 DIAGNOSIS — O479 False labor, unspecified: Secondary | ICD-10-CM

## 2023-06-03 DIAGNOSIS — Z3A35 35 weeks gestation of pregnancy: Secondary | ICD-10-CM

## 2023-06-03 DIAGNOSIS — F319 Bipolar disorder, unspecified: Secondary | ICD-10-CM

## 2023-06-03 DIAGNOSIS — Z3493 Encounter for supervision of normal pregnancy, unspecified, third trimester: Secondary | ICD-10-CM

## 2023-06-03 NOTE — Progress Notes (Signed)
   PRENATAL VISIT NOTE  Subjective:  Kaitlyn Whitaker is a 27 y.o. Q6V7846 at [redacted]w[redacted]d being seen today for ongoing prenatal care.  She is currently monitored for the following issues for this low-risk pregnancy and has Bipolar 2 disorder (HCC); Depression; Supervision of normal pregnancy; History of pre-eclampsia; and Anemia during pregnancy in third trimester on their problem list.  Patient reports no complaints.  Contractions: Not present. Vag. Bleeding: None.  Movement: Present. Denies leaking of fluid.   The following portions of the patient's history were reviewed and updated as appropriate: allergies, current medications, past family history, past medical history, past social history, past surgical history and problem list.   Objective:   Vitals:   06/03/23 1633  BP: 124/86  Pulse: 96  Weight: 147 lb (66.7 kg)    Fetal Status: Fetal Heart Rate (bpm): 140 Fundal Height: 33 cm Movement: Present     General:  Alert, oriented and cooperative. Patient is in no acute distress.  Skin: Skin is warm and dry. No rash noted.   Cardiovascular: Normal heart rate noted  Respiratory: Normal respiratory effort, no problems with respiration noted  Abdomen: Soft, gravid, appropriate for gestational age.  Pain/Pressure: Present     Pelvic: Cervical exam deferred        Extremities: Normal range of motion.  Edema: None  Mental Status: Normal mood and affect. Normal behavior. Normal judgment and thought content.   Assessment and Plan:  Pregnancy: [id number] at [redacted]w[redacted]d 1. Encounter for supervision of low-risk pregnancy in third trimester - Patient doing well.  - Reports vigorous and frequent fetal movement   2. [redacted] weeks gestation of pregnancy - GBS at next visit   3. Anemia during pregnancy in third trimester - Currently getting iron infusions  - Currently on cardiac monitoring for Increased HR.   4. Bipolar 1 disorder (HCC) - Stable on Abilify.   5. Braxton Hick's contraction - Reviewed  normal discomforts of pregnancy   Preterm labor symptoms and general obstetric precautions including but not limited to vaginal bleeding, contractions, leaking of fluid and fetal movement were reviewed in detail with the patient. Please refer to After Visit Summary for other counseling recommendations.   Return in about 1 week (around 06/10/2023) for LOB w GBS.  Future Appointments  Date Time Provider Department Center  06/04/2023 10:30 AM CH[facility]-CHAIR 7 [facility] None  06/06/2023  3:00 PM CHINF-CHAIR 8 [facility] None  06/09/2023  2:30 PM CHINF-CHAIR 5 [facility] None  06/11/2023  4:15 PM Celedonio Savage, MD [facility]  06/17/2023  4:05 PM MC-CV [facility] ECHO 5 [facility]  06/18/2023  8:20 AM [name], MD [facility] None  08/22/2023  8:20 AM Tobb, Lavona Mound, DO [facility] None    Sabre Leonetti (Danella Deis) Suzie Portela, MSN, CNM  Center for [facility]  06/03/2023 5:29 PM

## 2023-06-04 ENCOUNTER — Ambulatory Visit: Payer: No Typology Code available for payment source

## 2023-06-04 VITALS — BP 102/67 | HR 70 | Temp 98.2°F | Resp 17

## 2023-06-04 DIAGNOSIS — D508 Other iron deficiency anemias: Secondary | ICD-10-CM

## 2023-06-04 DIAGNOSIS — O99013 Anemia complicating pregnancy, third trimester: Secondary | ICD-10-CM

## 2023-06-04 DIAGNOSIS — Z3A35 35 weeks gestation of pregnancy: Secondary | ICD-10-CM

## 2023-06-04 MED ORDER — DIPHENHYDRAMINE HCL 25 MG PO CAPS
25.0000 mg | ORAL_CAPSULE | Freq: Once | ORAL | Status: AC
  Administered 2023-06-04: 25 mg via ORAL
  Filled 2023-06-04: qty 1

## 2023-06-04 MED ORDER — ACETAMINOPHEN 325 MG PO TABS
650.0000 mg | ORAL_TABLET | Freq: Once | ORAL | Status: AC
  Administered 2023-06-04: 650 mg via ORAL
  Filled 2023-06-04: qty 2

## 2023-06-04 MED ORDER — SODIUM CHLORIDE 0.9 % IV SOLN
200.0000 mg | Freq: Once | INTRAVENOUS | Status: AC
  Administered 2023-06-04: 200 mg via INTRAVENOUS
  Filled 2023-06-04: qty 10

## 2023-06-04 NOTE — Progress Notes (Signed)
Diagnosis: Acute Anemia  Provider:  Chilton Greathouse MD  Procedure: IV Infusion  IV Type: Peripheral, IV Location: R Antecubital  Venofer (Iron Sucrose), Dose: 200 mg  Infusion Start Time: 1111  Infusion Stop Time: 1128  Post Infusion IV Care: Patient declined observation and Peripheral IV Discontinued  Discharge: Condition: Good, Destination: Home . AVS declined.  Performed by:  Wyvonne Lenz, RN

## 2023-06-06 ENCOUNTER — Ambulatory Visit (INDEPENDENT_AMBULATORY_CARE_PROVIDER_SITE_OTHER): Payer: No Typology Code available for payment source

## 2023-06-06 VITALS — BP 105/70 | HR 78 | Temp 98.1°F | Resp 16 | Ht 64.0 in | Wt 145.4 lb

## 2023-06-06 DIAGNOSIS — D508 Other iron deficiency anemias: Secondary | ICD-10-CM | POA: Diagnosis not present

## 2023-06-06 DIAGNOSIS — O99013 Anemia complicating pregnancy, third trimester: Secondary | ICD-10-CM | POA: Diagnosis not present

## 2023-06-06 DIAGNOSIS — Z3A35 35 weeks gestation of pregnancy: Secondary | ICD-10-CM | POA: Diagnosis not present

## 2023-06-06 MED ORDER — DIPHENHYDRAMINE HCL 25 MG PO CAPS
25.0000 mg | ORAL_CAPSULE | Freq: Once | ORAL | Status: AC
  Administered 2023-06-06: 25 mg via ORAL
  Filled 2023-06-06: qty 1

## 2023-06-06 MED ORDER — SODIUM CHLORIDE 0.9 % IV SOLN
200.0000 mg | Freq: Once | INTRAVENOUS | Status: AC
  Administered 2023-06-06: 200 mg via INTRAVENOUS
  Filled 2023-06-06: qty 10

## 2023-06-06 MED ORDER — ACETAMINOPHEN 325 MG PO TABS
650.0000 mg | ORAL_TABLET | Freq: Once | ORAL | Status: AC
  Administered 2023-06-06: 650 mg via ORAL
  Filled 2023-06-06: qty 2

## 2023-06-06 NOTE — Progress Notes (Signed)
Diagnosis: Iron Deficiency Anemia  Provider:  Chilton Greathouse MD  Procedure: IV Infusion  IV Type: Peripheral, IV Location: R Forearm  Venofer (Iron Sucrose), Dose: 200 mg  Infusion Start Time: 1528  Infusion Stop Time: 1545  Post Infusion IV Care: Patient declined observation and Peripheral IV Discontinued  Discharge: Condition: Good, Destination: Home . AVS Declined  Performed by:  Adriana Mccallum, RN

## 2023-06-07 ENCOUNTER — Encounter: Payer: Self-pay | Admitting: Family Medicine

## 2023-06-09 ENCOUNTER — Ambulatory Visit (INDEPENDENT_AMBULATORY_CARE_PROVIDER_SITE_OTHER): Payer: No Typology Code available for payment source

## 2023-06-09 VITALS — BP 108/76 | HR 82 | Temp 98.0°F | Resp 16 | Ht 64.0 in | Wt 147.6 lb

## 2023-06-09 DIAGNOSIS — D508 Other iron deficiency anemias: Secondary | ICD-10-CM

## 2023-06-09 DIAGNOSIS — Z3A36 36 weeks gestation of pregnancy: Secondary | ICD-10-CM | POA: Diagnosis not present

## 2023-06-09 DIAGNOSIS — O99013 Anemia complicating pregnancy, third trimester: Secondary | ICD-10-CM

## 2023-06-09 MED ORDER — SODIUM CHLORIDE 0.9 % IV SOLN
200.0000 mg | Freq: Once | INTRAVENOUS | Status: AC
  Administered 2023-06-09: 200 mg via INTRAVENOUS
  Filled 2023-06-09: qty 10

## 2023-06-09 MED ORDER — DIPHENHYDRAMINE HCL 25 MG PO CAPS
25.0000 mg | ORAL_CAPSULE | Freq: Once | ORAL | Status: AC
  Administered 2023-06-09: 25 mg via ORAL
  Filled 2023-06-09: qty 1

## 2023-06-09 MED ORDER — ACETAMINOPHEN 325 MG PO TABS
650.0000 mg | ORAL_TABLET | Freq: Once | ORAL | Status: AC
  Administered 2023-06-09: 650 mg via ORAL
  Filled 2023-06-09: qty 2

## 2023-06-09 NOTE — Progress Notes (Signed)
Diagnosis: Iron Deficiency Anemia  Provider:  Chilton Greathouse MD  Procedure: IV Infusion  IV Type: Peripheral, IV Location: R Forearm  Venofer (Iron Sucrose), Dose: 200 mg  Infusion Start Time: 1458  Infusion Stop Time: 1524  Post Infusion IV Care: Observation period completed and Peripheral IV Discontinued  Discharge: Condition: Good, Destination: Home . AVS Declined  Performed by:  Loney Hering, LPN

## 2023-06-11 ENCOUNTER — Other Ambulatory Visit (HOSPITAL_COMMUNITY)
Admission: RE | Admit: 2023-06-11 | Discharge: 2023-06-11 | Disposition: A | Discharge status: Home / Self Care | Payer: No Typology Code available for payment source | Source: Ambulatory Visit | Attending: Family Medicine | Admitting: Family Medicine

## 2023-06-11 ENCOUNTER — Other Ambulatory Visit: Payer: Self-pay

## 2023-06-11 ENCOUNTER — Ambulatory Visit (INDEPENDENT_AMBULATORY_CARE_PROVIDER_SITE_OTHER): Payer: No Typology Code available for payment source | Admitting: Family Medicine

## 2023-06-11 VITALS — BP 120/82 | HR 78 | Wt 147.9 lb

## 2023-06-11 DIAGNOSIS — Z3403 Encounter for supervision of normal first pregnancy, third trimester: Secondary | ICD-10-CM

## 2023-06-11 DIAGNOSIS — Z23 Encounter for immunization: Secondary | ICD-10-CM

## 2023-06-11 DIAGNOSIS — O99013 Anemia complicating pregnancy, third trimester: Secondary | ICD-10-CM

## 2023-06-11 DIAGNOSIS — Z3A36 36 weeks gestation of pregnancy: Secondary | ICD-10-CM | POA: Diagnosis not present

## 2023-06-11 DIAGNOSIS — Z8759 Personal history of other complications of pregnancy, childbirth and the puerperium: Secondary | ICD-10-CM

## 2023-06-11 LAB — POCT HEMOGLOBIN-HEMACUE: Hemoglobin: 10.6 g/dL — ABNORMAL LOW (ref 12.0–15.0)

## 2023-06-11 NOTE — Progress Notes (Signed)
   PRENATAL VISIT NOTE  Subjective:  Kaitlyn Whitaker is a 27 y.o. [id number] at [redacted]w[redacted]d being seen today for ongoing prenatal care.  She is currently monitored for the following issues for this high-risk pregnancy and has Bipolar 2 disorder (HCC); Depression; Supervision of normal pregnancy; History of pre-eclampsia; and Anemia during pregnancy in third trimester on their problem list.  Patient reports no complaints.  Contractions: Irritability. Vag. Bleeding: None.  Movement: (!) Decreased. Denies leaking of fluid.   The following portions of the patient's history were reviewed and updated as appropriate: allergies, current medications, past family history, past medical history, past social history, past surgical history and problem list.   Objective:   Vitals:   06/11/23 1627  BP: 120/82  Pulse: 78  Weight: 147 lb 14.4 oz (67.1 kg)    Fetal Status: Fetal Heart Rate (bpm): 131 Fundal Height: 36 cm Movement: (!) Decreased  Presentation: Vertex  General:  Alert, oriented and cooperative. Patient is in no acute distress.  Skin: Skin is warm and dry. No rash noted.   Cardiovascular: Normal heart rate noted  Respiratory: Normal respiratory effort, no problems with respiration noted  Abdomen: Soft, gravid, appropriate for gestational age.  Pain/Pressure: Present     Pelvic: Cervical exam deferred Dilation: 1.5 Effacement (%): 50 Station: -2  Extremities: Normal range of motion.  Edema: None  Mental Status: Normal mood and affect. Normal behavior. Normal judgment and thought content.   Assessment and Plan:  Pregnancy: A5W0981 at [redacted]w[redacted]d 1. Encounter for supervision of normal first pregnancy in third trimester FH appropriate Vigorous movement but movements are small Reports increased vaginal pressure Desires WB, no contraindications for WB currently.  - GC/Chlamydia probe amp ([place])not at [facility] - Culture, beta strep (group b only)  2. [redacted] weeks gestation of pregnancy - GC/Chlamydia  probe amp ([place])not at [facility] - Culture, beta strep (group b only)  3. History of pre-eclampsia BP wnl  4. Anemia during pregnancy in third trimester Last HGB was 9.2 S/p IV infusions POC Hgb today 10.1  Term labor symptoms and general obstetric precautions including but not limited to vaginal bleeding, contractions, leaking of fluid and fetal movement were reviewed in detail with the patient. Please refer to After Visit Summary for other counseling recommendations.   Return in about 1 week (around 06/18/2023) for Routine prenatal care, CNM if possible.  Future Appointments  Date Time Provider Department Center  06/17/2023  4:05 PM MC-CV [facility] ECHO 5 [facility]ECHO [facility]  06/18/2023  8:20 AM [name], MD [facility] None  08/22/2023  8:20 AM Thomasene Ripple, DO [facility] None    Federico Flake, MD

## 2023-06-13 LAB — GC/CHLAMYDIA PROBE AMP (~~LOC~~) NOT AT ARMC
Chlamydia: NEGATIVE
Comment: NEGATIVE
Comment: NORMAL
Neisseria Gonorrhea: NEGATIVE
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
## 2023-06-15 LAB — CULTURE, BETA STREP (GROUP B ONLY): Strep Gp B Culture: NEGATIVE

## 2023-06-17 ENCOUNTER — Ambulatory Visit (HOSPITAL_COMMUNITY): Payer: No Typology Code available for payment source | Attending: Cardiovascular Disease

## 2023-06-17 DIAGNOSIS — R0789 Other chest pain: Secondary | ICD-10-CM | POA: Insufficient documentation

## 2023-06-17 LAB — ECHOCARDIOGRAM COMPLETE
Area-P 1/2: 3.81 cm2
S' Lateral: 2.9 cm

## 2023-06-18 ENCOUNTER — Encounter: Payer: Self-pay | Admitting: Psychiatry

## 2023-06-18 ENCOUNTER — Telehealth (INDEPENDENT_AMBULATORY_CARE_PROVIDER_SITE_OTHER): Payer: No Typology Code available for payment source | Admitting: Psychiatry

## 2023-06-18 DIAGNOSIS — F3341 Major depressive disorder, recurrent, in partial remission: Secondary | ICD-10-CM | POA: Diagnosis not present

## 2023-06-18 DIAGNOSIS — F411 Generalized anxiety disorder: Secondary | ICD-10-CM | POA: Diagnosis not present

## 2023-06-18 DIAGNOSIS — F4310 Post-traumatic stress disorder, unspecified: Secondary | ICD-10-CM | POA: Diagnosis not present

## 2023-06-18 MED ORDER — ARIPIPRAZOLE 2 MG PO TABS: 2.0000 mg | ORAL_TABLET | Freq: Every day | ORAL | 0 refills | Status: DC

## 2023-06-18 MED ORDER — DULOXETINE HCL 60 MG PO CPEP: 60.0000 mg | ORAL_CAPSULE | Freq: Every day | ORAL | 0 refills | Status: DC

## 2023-06-18 NOTE — Patient Instructions (Signed)
Continue duloxetine 60 mg  Continue Abilify 2 mg daily  Next appointment: 1/8 at 8 40

## 2023-06-23 ENCOUNTER — Other Ambulatory Visit: Payer: Self-pay

## 2023-06-23 ENCOUNTER — Ambulatory Visit (INDEPENDENT_AMBULATORY_CARE_PROVIDER_SITE_OTHER): Payer: No Typology Code available for payment source | Admitting: Obstetrics and Gynecology

## 2023-06-23 VITALS — BP 120/81 | HR 71 | Wt 148.7 lb

## 2023-06-23 DIAGNOSIS — Z3A38 38 weeks gestation of pregnancy: Secondary | ICD-10-CM

## 2023-06-23 DIAGNOSIS — Z8759 Personal history of other complications of pregnancy, childbirth and the puerperium: Secondary | ICD-10-CM

## 2023-06-23 DIAGNOSIS — O99013 Anemia complicating pregnancy, third trimester: Secondary | ICD-10-CM

## 2023-06-23 DIAGNOSIS — Z3403 Encounter for supervision of normal first pregnancy, third trimester: Secondary | ICD-10-CM

## 2023-06-23 NOTE — Progress Notes (Signed)
   PRENATAL VISIT NOTE  Subjective:  Kaitlyn Whitaker is a 27 y.o. [id number] at [redacted]w[redacted]d being seen today for ongoing prenatal care.  She is currently monitored for the following issues for this low-risk pregnancy and has Bipolar 2 disorder (HCC); Depression; Supervision of normal pregnancy; History of pre-eclampsia; and Anemia during pregnancy in third trimester on their problem list.  Patient reports no complaints.  Contractions: Not present. Vag. Bleeding: None.  Movement: Present. Denies leaking of fluid.   The following portions of the patient's history were reviewed and updated as appropriate: allergies, current medications, past family history, past medical history, past social history, past surgical history and problem list.   Objective:   Vitals:   06/23/23 1529  BP: 120/81  Pulse: 71  Weight: 148 lb 11.2 oz (67.4 kg)    Fetal Status: Fetal Heart Rate (bpm): 126 Fundal Height: 37 cm Movement: Present  Presentation: Vertex  General:  Alert, oriented and cooperative. Patient is in no acute distress.  Skin: Skin is warm and dry. No rash noted.   Cardiovascular: Normal heart rate noted  Respiratory: Normal respiratory effort, no problems with respiration noted  Abdomen: Soft, gravid, appropriate for gestational age.  Pain/Pressure: Present     Pelvic: Cervical exam performed in the presence of a chaperone Dilation: 3 Effacement (%): 60 Station: -3  Extremities: Normal range of motion.  Edema: Trace  Mental Status: Normal mood and affect. Normal behavior. Normal judgment and thought content.   Assessment and Plan:  Pregnancy: [id number] at [redacted]w[redacted]d 1. Encounter for supervision of normal first pregnancy in third trimester BP and FHR normal Feeling regular fetal movement FH appropriate   2. History of pre-eclampsia Normotensive   3. Anemia during pregnancy in third trimester Continue oral iron   4. [redacted] weeks gestation of pregnancy Labor precautions discussed, discussed methods to  try  at home    Term labor symptoms and general obstetric precautions including but not limited to vaginal bleeding, contractions, leaking of fluid and fetal movement were reviewed in detail with the patient. Please refer to After Visit Summary for other counseling recommendations.    Future Appointments  Date Time Provider Department Center  06/30/2023  3:35 PM Warden Fillers, MD [facility]  08/22/2023  8:20 AM Thomasene Ripple, DO [facility] None  09/10/2023  8:40 AM [name], MD [facility] None    Albertine Grates, FNP

## 2023-06-24 ENCOUNTER — Encounter: Payer: Medicaid Other | Admitting: Advanced Practice Midwife

## 2023-06-25 ENCOUNTER — Encounter (HOSPITAL_COMMUNITY): Payer: Self-pay | Admitting: Obstetrics & Gynecology

## 2023-06-25 ENCOUNTER — Inpatient Hospital Stay (HOSPITAL_COMMUNITY)
Admission: AD | Admit: 2023-06-25 | Discharge: 2023-06-26 | DRG: 807 | Disposition: A | Discharge status: Home / Self Care | Payer: No Typology Code available for payment source | Attending: Family Medicine | Admitting: Family Medicine

## 2023-06-25 ENCOUNTER — Other Ambulatory Visit: Payer: Self-pay

## 2023-06-25 DIAGNOSIS — Z87891 Personal history of nicotine dependence: Secondary | ICD-10-CM | POA: Diagnosis not present

## 2023-06-25 DIAGNOSIS — Z79899 Other long term (current) drug therapy: Secondary | ICD-10-CM | POA: Diagnosis not present

## 2023-06-25 DIAGNOSIS — Z8349 Family history of other endocrine, nutritional and metabolic diseases: Secondary | ICD-10-CM

## 2023-06-25 DIAGNOSIS — Z818 Family history of other mental and behavioral disorders: Secondary | ICD-10-CM

## 2023-06-25 DIAGNOSIS — O9902 Anemia complicating childbirth: Secondary | ICD-10-CM | POA: Diagnosis present

## 2023-06-25 DIAGNOSIS — Z8249 Family history of ischemic heart disease and other diseases of the circulatory system: Secondary | ICD-10-CM | POA: Diagnosis not present

## 2023-06-25 DIAGNOSIS — Z86711 Personal history of pulmonary embolism: Secondary | ICD-10-CM | POA: Diagnosis not present

## 2023-06-25 DIAGNOSIS — O134 Gestational [pregnancy-induced] hypertension without significant proteinuria, complicating childbirth: Secondary | ICD-10-CM | POA: Diagnosis not present

## 2023-06-25 DIAGNOSIS — Z7982 Long term (current) use of aspirin: Secondary | ICD-10-CM | POA: Diagnosis not present

## 2023-06-25 DIAGNOSIS — Z833 Family history of diabetes mellitus: Secondary | ICD-10-CM | POA: Diagnosis not present

## 2023-06-25 DIAGNOSIS — Z8759 Personal history of other complications of pregnancy, childbirth and the puerperium: Secondary | ICD-10-CM

## 2023-06-25 DIAGNOSIS — Z3490 Encounter for supervision of normal pregnancy, unspecified, unspecified trimester: Secondary | ICD-10-CM

## 2023-06-25 DIAGNOSIS — Z3A38 38 weeks gestation of pregnancy: Secondary | ICD-10-CM | POA: Diagnosis not present

## 2023-06-25 DIAGNOSIS — O99344 Other mental disorders complicating childbirth: Secondary | ICD-10-CM | POA: Diagnosis not present

## 2023-06-25 DIAGNOSIS — O99013 Anemia complicating pregnancy, third trimester: Secondary | ICD-10-CM | POA: Diagnosis present

## 2023-06-25 DIAGNOSIS — O26893 Other specified pregnancy related conditions, third trimester: Secondary | ICD-10-CM | POA: Diagnosis not present

## 2023-06-25 LAB — CBC
HCT: 37 % (ref 36.0–46.0)
Hemoglobin: 12.3 g/dL (ref 12.0–15.0)
MCH: 27.3 pg (ref 26.0–34.0)
MCHC: 33.2 g/dL (ref 30.0–36.0)
MCV: 82 fL (ref 80.0–100.0)
Platelets: 197 10*3/uL (ref 150–400)
RBC: 4.51 MIL/uL (ref 3.87–5.11)
RDW: 20.5 % — ABNORMAL HIGH (ref 11.5–15.5)
WBC: 6.9 10*3/uL (ref 4.0–10.5)
nRBC: 0 % (ref 0.0–0.2)

## 2023-06-25 LAB — TYPE AND SCREEN
ABO/RH(D): B POS
Antibody Screen: NEGATIVE

## 2023-06-25 LAB — RPR: RPR Ser Ql: NONREACTIVE

## 2023-06-25 MED ORDER — FLEET ENEMA RE ENEM: 1.0000 | ENEMA | Freq: Every day | RECTAL | Status: DC | PRN

## 2023-06-25 MED ORDER — WITCH HAZEL-GLYCERIN EX PADS: 1.0000 | MEDICATED_PAD | CUTANEOUS | Status: DC | PRN

## 2023-06-25 MED ORDER — SIMETHICONE 80 MG PO CHEW: 80.0000 mg | CHEWABLE_TABLET | ORAL | Status: DC | PRN

## 2023-06-25 MED ORDER — ONDANSETRON HCL 4 MG PO TABS: 4.0000 mg | ORAL_TABLET | ORAL | Status: DC | PRN

## 2023-06-25 MED ORDER — LACTATED RINGERS IV SOLN: INTRAVENOUS | Status: DC

## 2023-06-25 MED ORDER — COCONUT OIL OIL: 1.0000 | TOPICAL_OIL | Status: DC | PRN

## 2023-06-25 MED ORDER — PRENATAL MULTIVITAMIN CH
1.0000 | ORAL_TABLET | Freq: Every day | ORAL | Status: DC
  Administered 2023-06-25 – 2023-06-26 (×2): 1 via ORAL
  Filled 2023-06-25 (×2): qty 1

## 2023-06-25 MED ORDER — ONDANSETRON HCL 4 MG/2ML IJ SOLN: 4.0000 mg | Freq: Four times a day (QID) | INTRAMUSCULAR | Status: DC | PRN

## 2023-06-25 MED ORDER — FENTANYL CITRATE (PF) 100 MCG/2ML IJ SOLN
50.0000 ug | INTRAMUSCULAR | Status: DC | PRN
  Filled 2023-06-25: qty 2

## 2023-06-25 MED ORDER — OXYCODONE HCL 5 MG PO TABS
10.0000 mg | ORAL_TABLET | ORAL | Status: DC | PRN
  Administered 2023-06-26: 10 mg via ORAL
  Filled 2023-06-25: qty 2

## 2023-06-25 MED ORDER — OXYCODONE HCL 5 MG PO TABS
5.0000 mg | ORAL_TABLET | ORAL | Status: DC | PRN
  Administered 2023-06-25 (×3): 5 mg via ORAL
  Filled 2023-06-25 (×3): qty 1

## 2023-06-25 MED ORDER — LIDOCAINE HCL (PF) 1 % IJ SOLN: 30.0000 mL | INTRAMUSCULAR | Status: DC | PRN

## 2023-06-25 MED ORDER — ACETAMINOPHEN 325 MG PO TABS: 650.0000 mg | ORAL_TABLET | ORAL | Status: DC | PRN

## 2023-06-25 MED ORDER — OXYTOCIN BOLUS FROM INFUSION
333.0000 mL | Freq: Once | INTRAVENOUS | Status: AC
  Administered 2023-06-25: 333 mL via INTRAVENOUS

## 2023-06-25 MED ORDER — DIPHENHYDRAMINE HCL 25 MG PO CAPS: 25.0000 mg | ORAL_CAPSULE | Freq: Four times a day (QID) | ORAL | Status: DC | PRN

## 2023-06-25 MED ORDER — ZOLPIDEM TARTRATE 5 MG PO TABS: 5.0000 mg | ORAL_TABLET | Freq: Every evening | ORAL | Status: DC | PRN

## 2023-06-25 MED ORDER — BENZOCAINE-MENTHOL 20-0.5 % EX AERO
1.0000 | INHALATION_SPRAY | CUTANEOUS | Status: DC | PRN
  Administered 2023-06-25: 1 via TOPICAL
  Filled 2023-06-25: qty 56

## 2023-06-25 MED ORDER — ACETAMINOPHEN 325 MG PO TABS
650.0000 mg | ORAL_TABLET | ORAL | Status: DC | PRN
  Administered 2023-06-25 – 2023-06-26 (×3): 650 mg via ORAL
  Filled 2023-06-25 (×3): qty 2

## 2023-06-25 MED ORDER — DIBUCAINE (PERIANAL) 1 % EX OINT: 1.0000 | TOPICAL_OINTMENT | CUTANEOUS | Status: DC | PRN

## 2023-06-25 MED ORDER — OXYCODONE-ACETAMINOPHEN 5-325 MG PO TABS: 1.0000 | ORAL_TABLET | ORAL | Status: DC | PRN

## 2023-06-25 MED ORDER — ONDANSETRON HCL 4 MG/2ML IJ SOLN: 4.0000 mg | INTRAMUSCULAR | Status: DC | PRN

## 2023-06-25 MED ORDER — OXYTOCIN-SODIUM CHLORIDE 30-0.9 UT/500ML-% IV SOLN
2.5000 [IU]/h | INTRAVENOUS | Status: DC
  Filled 2023-06-25: qty 500

## 2023-06-25 MED ORDER — SOD CITRATE-CITRIC ACID 500-334 MG/5ML PO SOLN: 30.0000 mL | ORAL | Status: DC | PRN

## 2023-06-25 MED ORDER — ARIPIPRAZOLE 2 MG PO TABS
2.0000 mg | ORAL_TABLET | Freq: Every day | ORAL | Status: DC
  Administered 2023-06-26: 2 mg via ORAL
  Filled 2023-06-25: qty 1

## 2023-06-25 MED ORDER — IBUPROFEN 600 MG PO TABS
600.0000 mg | ORAL_TABLET | Freq: Four times a day (QID) | ORAL | Status: DC
  Administered 2023-06-25 – 2023-06-26 (×5): 600 mg via ORAL
  Filled 2023-06-25 (×5): qty 1

## 2023-06-25 MED ORDER — LACTATED RINGERS IV SOLN
500.0000 mL | INTRAVENOUS | Status: DC | PRN
Start: 2023-06-25 — End: 2023-06-25

## 2023-06-25 MED ORDER — OXYCODONE-ACETAMINOPHEN 5-325 MG PO TABS: 2.0000 | ORAL_TABLET | ORAL | Status: DC | PRN

## 2023-06-25 MED ORDER — SENNOSIDES-DOCUSATE SODIUM 8.6-50 MG PO TABS
2.0000 | ORAL_TABLET | Freq: Every day | ORAL | Status: DC
  Administered 2023-06-26: 2 via ORAL
  Filled 2023-06-25: qty 2

## 2023-06-25 NOTE — MAU Note (Signed)
.  Kaitlyn Whitaker is a 27 y.o. at [redacted]w[redacted]d here in MAU reporting: CTX's that began yesterday afternoon that are now every 4-5 minutes. Denies VB or LOF. +FM.   GBS-. Anemia. Desires water birth.  Last cervical exam on 10/21 was 360-3. Hx Pre-E. Has been normotensive in this pregnancy.  Onset of complaint: Yesterday afternoon Pain score: 7/10 lower abdomen  FHT: 157 initial external Lab orders placed from triage: MAU Labor Eval

## 2023-06-25 NOTE — MAU Note (Signed)
Pt informed that the ultrasound is considered a limited OB ultrasound and is not intended to be a complete ultrasound exam.  Patient also informed that the ultrasound is not being completed with the intent of assessing for fetal or placental anomalies or any pelvic abnormalities.  Explained that the purpose of today's ultrasound is to assess for presentation.  Patient acknowledges the purpose of the exam and the limitations of the study.    Vertex presentation confirmed by bedside US.  

## 2023-06-25 NOTE — H&P (Signed)
OBSTETRIC ADMISSION HISTORY AND PHYSICAL  Kaitlyn Whitaker is a 27 y.o. female [phone] with IUP at [redacted]w[redacted]d by LMP presenting for labor.   Reports fetal movement. Denies vaginal bleeding.  She received her prenatal care at [facility].  Support person in labor: Erkan (fiance')  Ultrasounds Anatomy U/S: Normal EFW @ 35.1 weeks 2605 gm (5 lbs 12 oz ) 47th percentile  Prenatal History/Complications: Anemia in Pregnancy  gHTN in previous pregnancy H/O PEC Bipolar/Depression  OB BOX: NURSING  PROVIDER  [employer], [facility] Dating by LMP  [facility] Model Traditional Anatomy U/S WNL  Initiated care at  [employer]  English              LAB RESULTS   Support Person [facility] [employer] NIPS:  LR     AFP:        NEG        NT/IT (FT only)     Carrier Screen Horizon: Neg  Rhogam  B/Positive/-- (04/16 1637) A1C/GTT Early:     Third trimester: Normal 85/113/125  Flu Vaccine 06/11/2023    TDaP Vaccine  04/28/23 Blood Type B/Positive/-- (04/16 1637)  Covid Vaccine Pfizer (all doses) Antibody Negative (04/16 1637)    Rubella 3.11 (04/16 1637)IMMUNE  Feeding Plan Both  RPR Non Reactive (04/16 1637)  Contraception Yes, IUD/tubal (undecided) HBsAg Negative (04/16 1637)  Circumcision If boy, yes HIV Non Reactive (04/16 1637)  Pediatrician  [facility] HCVAb Non Reactive (04/16 1637)  Prenatal Classes       Pap Diagnosis  Date Value Ref Range Status  07/30/2022      - Negative for intraepithelial lesion or malignancy (NILM)    BTLConsent  GC/CT Initial: Neg/Neg            36wks: Neg/Neg  VBAC  Consent  GBS  NEGATIVE For PCN allergy, check sensitivities        DME Rx [ ]  BP cuff [ ]  Weight Scale Waterbirth  [x]  Class [x]  Consent [x]  CNM visit  PHQ9 & GAD7 [  ] new OB [  ] 28 weeks  [  ] 36 weeks   Past Medical History: Past Medical History:  Diagnosis Date   Anemia    Anxiety    Anxiety state 02/09/2021   Back pain    Pt states chronic back pain after fall in  2016   Depression    feeling balanced, no problems   GERD (gastroesophageal reflux disease)     Past Surgical History: Past Surgical History:  Procedure Laterality Date   NASAL SINUS SURGERY     TONSILLECTOMY      Obstetrical History: OB History     Gravida  5   Para  2   Term  2   Preterm      AB  2   Living  2      SAB  2   IAB      Ectopic      Multiple  0   Live Births  2           Social History: Social History   Socioeconomic History   Marital status: Single    Spouse name: Not on file   Number of children: 1   Years of education: 12   Highest education level: 12th grade  Occupational History   Not on file  Tobacco Use   Smoking status: Former  Current packs/day: 0.00    Types: Cigarettes    Quit date: 01/13/2016    Years since quitting: 7.4    Passive exposure: Never   Smokeless tobacco: Never  Vaping Use   Vaping status: Never Used  Substance and Sexual Activity   Alcohol use: Not Currently    Comment: Occasional beer 2 times a month   Drug use: Not Currently    Types: Marijuana    Comment: 4 years ago   Sexual activity: Yes    Partners: Male  Other Topics Concern   Not on file  Social History Narrative   Not on file   Social Determinants of Health   Financial Resource Strain: Not on file  Food Insecurity: No Food Insecurity (12/18/2022)   Hunger Vital Sign    Worried About Running Out of Food in the Last Year: Never true    Ran Out of Food in the Last Year: Never true  Transportation Needs: No Transportation Needs (12/18/2022)   PRAPARE - Administrator, Civil Service (Medical): No    Lack of Transportation (Non-Medical): No  Physical Activity: Not on file  Stress: Not on file  Social Connections: Not on file    Family History: Family History  Problem Relation Age of Onset   Anxiety disorder Mother    Atrial fibrillation Mother    Pulmonary embolism Mother    Anemia Mother    Anxiety disorder  Father    Hypertension Father    Hypothyroidism Father    Diabetes Maternal Uncle     Allergies: No Known Allergies  Medications Prior to Admission  Medication Sig Dispense Refill Last Dose   [START ON 08/03/2023] ARIPiprazole (ABILIFY) 2 MG tablet Take 1 tablet (2 mg total) by mouth at bedtime. 90 tablet 0 06/24/2023   famotidine (PEPCID) 20 MG tablet TAKE 1 TABLET BY MOUTH TWICE A DAY 180 tablet 1 06/24/2023   Prenat-FeCbn-FeAspGl-FA-Omega (OB COMPLETE PETITE) 35-5-1-200 MG CAPS Take 1 capsule by mouth daily. 30 capsule 10 06/24/2023   aspirin EC 81 MG tablet Take 1 tablet (81 mg total) by mouth daily. Swallow whole. 30 tablet 12    [START ON 07/08/2023] DULoxetine (CYMBALTA) 60 MG capsule Take 1 capsule (60 mg total) by mouth daily. 90 capsule 0    ferrous sulfate 325 (65 FE) MG tablet Take 1 tablet (325 mg total) by mouth every other day. (Patient not taking: Reported on 06/02/2023) 30 tablet 3    ondansetron (ZOFRAN-ODT) 4 MG disintegrating tablet Take 1 tablet (4 mg total) by mouth every 8 (eight) hours as needed for nausea or vomiting. 15 tablet 1      Review of Systems  All systems reviewed and negative except as stated in HPI  Blood pressure 120/82, pulse 80, temperature 97.9 F (36.6 C), temperature source Oral, resp. rate 18, height 5\' 4"  (1.626 m), weight 67.9 kg, last menstrual period 09/29/2022, SpO2 99%, unknown if currently breastfeeding. General appearance: alert, cooperative, and no distress Lungs: no respiratory distress Heart: regular rate  Abdomen: soft, non-tender; gravid Pelvic: adequate Extremities: Homans sign is negative, no sign of DVT Presentation: cephalic Fetal monitoring: Baseline rate 135 bpm   Variability moderate  Accelerations present   Decelerations none Uterine activity: regular every 3-4.5 mins  Dilation: 5 Effacement (%): 90 Station: -2 Exam by:: Erle Crocker, RN  Prenatal labs: ABO, Rh: --/--/PENDING (10/23 4098) Antibody: PENDING  (10/23 0814) Rubella: 3.11 (04/16 1637) IMMUNE RPR: Non Reactive (08/23 0828)  HBsAg: Negative (04/16  1637)  HIV: Non Reactive (08/23 1914)  GBS: Negative/-- (10/09 0455)  Glucola: Normal 85/113/125 Genetic screening:  Low-Risk and Normal  Prenatal Transfer Tool  Maternal Diabetes: No Genetic Screening: Normal Maternal Ultrasounds/Referrals: Normal Fetal Ultrasounds or other Referrals:  None Maternal Substance Abuse:  No Significant Maternal Medications:  Meds include: Other: Abilify and Cymbalta Significant Maternal Lab Results: Group B Strep negative  Results for orders placed or performed during the hospital encounter of 06/25/23 (from the past 24 hour(s))  Type and screen [facility]   Collection Time: 06/25/23  8:14 AM  Result Value Ref Range   ABO/RH(D) PENDING    Antibody Screen PENDING    Sample Expiration      06/28/2023,2359 Performed at [facility], [address] [place], [place] [address]     Patient Active Problem List   Diagnosis Date Noted   Normal labor 06/25/2023   Anemia during pregnancy in third trimester 05/27/2023   History of pre-eclampsia 12/17/2022   Depression 12/11/2022   Supervision of normal pregnancy 12/11/2022   Bipolar 2 disorder (HCC) 06/28/2019    Assessment/Plan:  Kaitlyn Whitaker is a 27 y.o. [id number] at [redacted]w[redacted]d here for active labor  Labor: active -- pain control: desires water immersion -- Pt is interested in waterbirth.  No contraindications at this time per chart review/patient assessment.   -- Discussed waterbirth as option for low-risk pregnancy.  Reviewed conditions that may arise during pregnancy that will risk pt out of waterbirth including hypertension, meconium stained fluid, etc.  -- Waterbirth consent signed with understanding that if waterbirth provider deems an unsafe or uncomfortable situation that she is willing to exit to tub upon request or the water will be drained form the tub  immediately -- Patient verbalized an understanding of the plan of care and agrees.   Fetal Wellbeing: Cephalic by presentation by U/S (done by RN).  -- GBS (Negative) -- intermittent fetal monitoring - Category 1 NST   Postpartum Planning -- breast/bottle -- IUD/tubal for contraception    Raelyn Mora, CNM  06/25/2023, 8:34 AM

## 2023-06-25 NOTE — Discharge Summary (Shared)
Postpartum Discharge Summary  Date of Service updated***     Patient Name: Kaitlyn Whitaker DOB: 09-19-1995 MRN: [id number]  Date of admission: 06/25/2023 Delivery date:06/25/2023 Delivering provider: Nicki Reaper Whitaker Date of discharge: 06/25/2023  Admitting diagnosis: Normal labor [O80, Z37.9] Intrauterine pregnancy: [redacted]w[redacted]d     Secondary diagnosis:  Principal Problem:   Normal labor Active Problems:   Supervision of normal pregnancy   History of pre-eclampsia   Anemia during pregnancy in third trimester   Vaginal delivery  Additional problems: None    Discharge diagnosis: Term Pregnancy Delivered                                              Post partum procedures:{Postpartum procedures:23558} Augmentation: N/A Complications: None  Hospital course: Onset of Labor With Vaginal Delivery      27 y.o. yo [id number] at [redacted]w[redacted]d was admitted in Active Labor on 06/25/2023. Labor course was uncomplicated.  Precipitous delivery on arrival. Membrane Rupture Time/Date: 8:45 AM,06/25/2023  Delivery Method:Vaginal, Spontaneous Operative Delivery:N/A Episiotomy: None Lacerations:  None Patient had a postpartum course complicated by ***.  She is ambulating, tolerating a regular diet, passing flatus, and urinating well. Patient is discharged home in stable condition on 06/25/23.  Newborn Data: Birth date:06/25/2023 Birth time:9:00 AM Gender:Female Living status:Living Apgars:8 ,9  Weight:   Magnesium Sulfate received: No BMZ received: No Rhophylac:N/A MMR:N/A T-DaP:Given prenatally Flu: N/A RSV Vaccine received: {RSV:31013} Transfusion:{Transfusion received:30440034}  Immunizations received: Immunization History  Administered Date(s) Administered   Hepatitis A, Adult 07/30/2006   Hepatitis A, Ped/Adol-2 Dose 01/23/2006   Influenza, Seasonal, Injecte, Preservative Fre 06/11/2023   Influenza,inj,Quad PF,6+ Mos 06/25/2016   Influenza,inj,quad, With Preservative 08/21/2014    Influenza-Unspecified 05/17/2018, 05/30/2019, 06/10/2020   Meningococcal Conjugate 04/30/2007   Meningococcal Mcv4o 02/11/2013   PFIZER(Purple Top)SARS-COV-2 Vaccination 09/11/2019, 09/28/2019, 06/23/2020   Tdap 03/02/2007, 08/01/2016, 11/23/2018, 04/28/2023   Varicella 12/09/2005    Physical exam  Vitals:   06/25/23 0746 06/25/23 0907 06/25/23 0916  BP: 120/82 117/77 121/71  Pulse: 80 79 83  Resp: 18 18   Temp: 97.9 F (36.6 C)    TempSrc: Oral    SpO2: 99%    Weight: 67.9 kg    Height: 5\' 4"  (1.626 m)     General: {Exam; general:21111117} Lochia: {Desc; appropriate/inappropriate:30686::"appropriate"} Uterine Fundus: {Desc; firm/soft:30687} Incision: {Exam; incision:21111123} DVT Evaluation: {Exam; dvt:2111122} Labs: Lab Results  Component Value Date   WBC 6.9 06/25/2023   HGB 12.3 06/25/2023   HCT 37.0 06/25/2023   MCV 82.0 06/25/2023   PLT 197 06/25/2023      Latest Ref Rng & Units 12/27/2022    3:12 PM  CMP  Glucose 70 - 99 mg/dL 88   BUN 6 - 20 mg/dL 6   Creatinine 6.29 - 5.28 mg/dL 4.13   Sodium 244 - 010 mmol/L 133   Potassium 3.5 - 5.1 mmol/L 3.8   Chloride 98 - 111 mmol/L 102   CO2 22 - 32 mmol/L 21   Calcium 8.9 - 10.3 mg/dL 8.7   Total Protein 6.5 - 8.1 g/dL 6.2   Total Bilirubin 0.3 - 1.2 mg/dL 1.0   Alkaline Phos 38 - 126 U/L 45   AST 15 - 41 U/L 17   ALT 0 - 44 U/L 11    Edinburgh Score:    03/10/2019    3:18 PM  Edinburgh Postnatal Depression Scale Screening Tool  I have been able to laugh and see the funny side of things. 0  I have looked forward with enjoyment to things. 0  I have blamed myself unnecessarily when things went wrong. 3  I have been anxious or worried for no good reason. 0  I have felt scared or panicky for no good reason. 0  Things have been getting on top of me. 0  I have been so unhappy that I have had difficulty sleeping. 0  I have felt sad or miserable. 0  I have been so unhappy that I have been crying. 0  The thought  of harming myself has occurred to me. 0  Edinburgh Postnatal Depression Scale Total 3   No data recorded  After visit meds:  Allergies as of 06/25/2023   No Known Allergies   Med Rec must be completed prior to using this [facility]***        Discharge home in stable condition Infant Feeding: {Baby feeding:23562} Infant Disposition:{CHL IP OB HOME WITH WUJWJX:91478} Discharge instruction: per After Visit Summary and Postpartum booklet. Activity: Advance as tolerated. Pelvic rest for 6 weeks.  Diet: {OB GNFA:21308657} Future Appointments: Future Appointments  Date Time Provider Department Center  06/30/2023  3:35 PM Warden Fillers, MD [facility]  08/22/2023  8:20 AM Thomasene Ripple, DO [facility] None  09/10/2023  8:40 AM [name], MD [facility] None   Follow up Visit: Message sent  Please schedule this patient for a In person postpartum visit in 4 weeks with the following provider: Any provider. Additional Postpartum F/U: None   Low risk pregnancy complicated by:  None Delivery mode:  Vaginal, Spontaneous Anticipated Birth Control:  POPs   06/25/2023 Kaitlyn Savage, MD

## 2023-06-25 NOTE — Plan of Care (Signed)
Emiliya Chretien, RN 

## 2023-06-26 LAB — BIRTH TISSUE RECOVERY COLLECTION (PLACENTA DONATION)

## 2023-06-26 MED ORDER — IBUPROFEN 600 MG PO TABS: 600.0000 mg | ORAL_TABLET | Freq: Four times a day (QID) | ORAL | 0 refills | Status: DC

## 2023-06-26 MED ORDER — WITCH HAZEL-GLYCERIN EX PADS: 1.0000 | MEDICATED_PAD | CUTANEOUS | 12 refills | Status: DC | PRN

## 2023-06-26 MED ORDER — ACETAMINOPHEN 325 MG PO TABS: 650.0000 mg | ORAL_TABLET | ORAL | Status: DC | PRN

## 2023-06-26 MED ORDER — DIBUCAINE (PERIANAL) 1 % EX OINT: 1.0000 | TOPICAL_OINTMENT | CUTANEOUS | Status: DC | PRN

## 2023-06-26 MED ORDER — BENZOCAINE-MENTHOL 20-0.5 % EX AERO: 1.0000 | INHALATION_SPRAY | CUTANEOUS | Status: DC | PRN

## 2023-06-26 MED ORDER — COCONUT OIL OIL: 1.0000 | TOPICAL_OIL | Status: DC | PRN

## 2023-06-26 NOTE — Clinical Social Work Maternal (Signed)
CLINICAL SOCIAL WORK MATERNAL/CHILD NOTE  Patient Details  Name: Kaitlyn Whitaker MRN: [id number] Date of Birth: 1995/10/19  Date:  06/26/2023  Clinical Social Worker Initiating Note:  Enos Fling Date/Time: Initiated:  06/26/23/1141     Child's Name:  Kaitlyn Whitaker   Biological Parents:  Mother, Father Kaitlyn Whitaker 12-07-1993 Kaitlyn Whitaker 1995-11-05)   Need for Interpreter:  None   Reason for Referral:  Behavioral Health Concerns   Address:  [address] [place] [address]    Phone number:  [phone] (home)     Additional phone number:   Household Members/Support Persons (HM/SP):   Household Member/Support Person 1, Household Member/Support Person 2, Household Member/Support Person 3   HM/SP Name Relationship DOB or Age  HM/SP -1 [name] [place] 12-07-1993  HM/SP -2 [name] MOB's son 10-15-2016  HM/SP -3   MOB's Daughter 02-10-2019  HM/SP -4        HM/SP -5        HM/SP -6        HM/SP -7        HM/SP -8          Natural Supports (not living in the home):  Immediate Family, Spouse/significant other   Professional Supports: Therapist   Employment: Full-time   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   Education:   (2 Furniture conservator/restorer)   Homebound arranged:    Surveyor, quantity Resources:  Media planner    Other Resources:  [facility]   Cultural/Religious Considerations Which May Impact Care:    Strengths:  Ability to meet basic needs  , Merchandiser, retail, Home prepared for child  , Understanding of illness   Psychotropic Medications:         Pediatrician:    Armed forces operational officer area  Pediatrician List:   [facility]  [place]    [place]    [place] [facility]      Pediatrician Fax Number:    Risk Factors/Current Problems:  Mental Health Concerns     Cognitive State:  Alert  , Able to Concentrate  , Linear Thinking  , Insightful  , Goal Oriented      Mood/Affect:  Interested  , Comfortable  , Calm  , Happy  , Relaxed  , Bright     CSW Assessment: CSW received a consult for Bipolar; and met MOB at bedside to complete a full psychosocial assessment. CSW entered the room, introduced herself and explained the reason for the consult. MOB was polite, easy to engage, receptive to meeting with CSW, and appeared forthcoming.  CSW collected MOB's demographic information and inquired about her mental health history. MOB reported being diagnosed with Bipolar, Anxiety and Depression. MOB reported her bipolar as depressive and her last episode was in March 2024. MOB reported the episode occurred due to stopping her medication regiment of Abilify due to pregnancy; however she begin to experience symptoms and restarted her regiment. MOB reported restarting Duloxetine 60mg  for additional support of her symptoms once discharged. MOB reported currently not attending therapy; however plans to restart once her therapist with [facility] as availability. CSW provided education regarding the baby blues period vs. perinatal mood disorders, discussed treatment and gave resources for mental health follow up if concerns arise.  CSW recommends self-evaluation during the postpartum time period using the New Mom Checklist from Postpartum Progress and encouraged MOB to contact a medical professional if symptoms are noted at  any time. CSW assessed for safety with MOB SI/HI/DV;MOB denied all.   CSW asked MOB does she receive support resources; MOB said yes to receiving WIC support. MOB reported having all essential items for the infant including a carseat, bassinet and crib for safe sleeping. CSW provided review of Sudden Infant Death Syndrome (SIDS) precautions.    CSW Plan/Description:  CSW identifies no further need for intervention and no barriers to discharge at this time.    Barnetta Chapel, LCSW 06/26/2023, 11:45 AM

## 2023-06-30 ENCOUNTER — Encounter: Payer: No Typology Code available for payment source | Admitting: Obstetrics and Gynecology

## 2023-07-15 ENCOUNTER — Telehealth (HOSPITAL_COMMUNITY): Payer: Self-pay | Admitting: *Deleted

## 2023-07-15 NOTE — Telephone Encounter (Signed)
07/15/2023  Name: Ricardo Mairs MRN: [id number] DOB: 09-07-1995  Reason for Call:  Transition of Care Hospital Discharge Call  Contact Status: Patient Contact Status: Complete  Language assistant needed: Interpreter Mode: Interpreter Not Needed        Follow-Up Questions: Do You Have Any Concerns About Your Health As You Heal From Delivery?: No Do You Have Any Concerns About Your Infants Health?: No  Edinburgh Postnatal Depression Scale:  In the Past 7 Days:    PHQ2-9 Depression Scale:     Discharge Follow-up: Edinburgh score requires follow up?: N/A (patient declines completing Inocente Salles but states that she has a history of anxiety and depression, and is on medication and "feels great.") Patient was advised of the following resources:: Support Group  Post-discharge interventions: Reviewed Newborn Safe Sleep Practices  Malachy Mood  07/15/2023 1723

## 2023-07-17 ENCOUNTER — Other Ambulatory Visit: Payer: Self-pay | Admitting: Family Medicine

## 2023-07-23 ENCOUNTER — Ambulatory Visit: Payer: No Typology Code available for payment source | Admitting: Obstetrics & Gynecology

## 2023-07-23 ENCOUNTER — Other Ambulatory Visit: Payer: Self-pay

## 2023-07-23 NOTE — Progress Notes (Signed)
    Post Partum Visit Note  Kaitlyn Whitaker is a 27 y.o. [phone] female who presents for a postpartum visit. She is 4 weeks postpartum following a normal spontaneous vaginal delivery.  I have fully reviewed the prenatal and intrapartum course. The delivery was at [redacted]w[redacted]d gestational weeks.  Anesthesia: none. Postpartum course has been well. Baby is doing well. Baby is feeding by bottle - Similac Advance. Bleeding no bleeding. Bowel function is normal. Patient reports taking stool softener which has helped. Bladder function is normal. Patient is sexually active. Contraception method is none. Patient is interested in birth control pills. Postpartum depression screening: negative.   The pregnancy intention screening data noted above was reviewed. Potential methods of contraception were discussed. The patient elected to proceed with No data recorded.    Health Maintenance Due  Topic Date Due   COVID-19 Vaccine (4 - 2023-24 season) 05/04/2023    The following portions of the patient's history were reviewed and updated as appropriate: allergies, current medications, past family history, past medical history, past social history, past surgical history, and problem list.  Review of Systems Pertinent items are noted in HPI.  Objective:  LMP 09/29/2022 (Approximate)    General:  alert, cooperative, and no distress   Breasts:  not indicated  Lungs:   Heart:  regular rate and rhythm  Abdomen: soft, non-tender; bowel sounds normal; no masses,  no organomegaly   Wound   GU exam:  not indicated       Assessment:    There are no diagnoses linked to this encounter.  normal postpartum exam.   Plan:   Essential components of care per ACOG recommendations:  1.  Mood and well being: Patient with negative depression screening today. Reviewed local resources for support.  - Patient tobacco use? No.   - hx of drug use? No.    2. Infant care and feeding:  -Patient currently breastmilk feeding?  No.  -Social determinants of health (SDOH) reviewed in EPIC. No concerns  3. Sexuality, contraception and birth spacing - Patient does not want a pregnancy in the next year.  Desired family size is 3 children.  - Reviewed reproductive life planning. Reviewed contraceptive methods based on pt preferences and effectiveness.  Patient desired Oral Contraceptive today.   - Discussed birth spacing of 18 months  4. Sleep and fatigue -Encouraged family/partner/community support of 4 hrs of uninterrupted sleep to help with mood and fatigue  5. Physical Recovery  - Discussed patients delivery and complications. She describes her labor as good. - Patient had a Vaginal, no problems at delivery. Patient had a 1st degree laceration. Perineal healing reviewed. Patient expressed understanding - Patient has urinary incontinence? No. - Patient is safe to resume physical and sexual activity  6.  Health Maintenance - HM due items addressed Yes - Last pap smear  Diagnosis  Date Value Ref Range Status  07/30/2022      - Negative for intraepithelial lesion or malignancy (NILM)   Pap smear not done at today's visit.  -Breast Cancer screening indicated? No.   7. Chronic Disease/Pregnancy Condition follow up: None  - PCP follow up Adam Phenix, MD Center for [facility], [facility]

## 2023-08-22 ENCOUNTER — Ambulatory Visit: Payer: No Typology Code available for payment source | Admitting: Cardiology
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
## 2023-09-10 ENCOUNTER — Encounter: Payer: Self-pay | Admitting: Psychiatry

## 2023-09-10 ENCOUNTER — Telehealth: Payer: Medicaid Other | Admitting: Psychiatry

## 2023-09-10 DIAGNOSIS — F411 Generalized anxiety disorder: Secondary | ICD-10-CM | POA: Diagnosis not present

## 2023-09-10 DIAGNOSIS — F3341 Major depressive disorder, recurrent, in partial remission: Secondary | ICD-10-CM

## 2023-09-10 DIAGNOSIS — F4310 Post-traumatic stress disorder, unspecified: Secondary | ICD-10-CM | POA: Diagnosis not present

## 2023-09-10 MED ORDER — DULOXETINE HCL 60 MG PO CPEP: 60.0000 mg | ORAL_CAPSULE | Freq: Every day | ORAL | 0 refills | Status: DC

## 2023-09-10 MED ORDER — ARIPIPRAZOLE 2 MG PO TABS: 2.0000 mg | ORAL_TABLET | Freq: Every day | ORAL | 0 refills | Status: DC

## 2023-09-10 NOTE — Patient Instructions (Signed)
 1. Continue duloxetine 60 mg  2. Continue Abilify 2 mg daily  3. Next appointment: 3/26 at 8 am

## 2023-11-04 ENCOUNTER — Telehealth: Payer: Self-pay

## 2023-11-04 NOTE — Telephone Encounter (Signed)
 received fax from [email or web address] that a prior auth was needed for aripiprazole

## 2023-11-04 NOTE — Telephone Encounter (Signed)
 went online to [email or web address] and submitted the prior auth . - pending

## 2023-11-05 NOTE — Telephone Encounter (Signed)
 faxed and confirmed prior auth approval for the aripiprazole

## 2023-11-05 NOTE — Telephone Encounter (Signed)
 received notice that the prior auth for the aripiprazole was approved from 11-04-23 to 11-03-24
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
## 2023-11-26 ENCOUNTER — Telehealth: Payer: Medicaid Other | Admitting: Psychiatry

## 2023-11-26 ENCOUNTER — Encounter: Payer: Self-pay | Admitting: Psychiatry

## 2023-11-26 DIAGNOSIS — F4310 Post-traumatic stress disorder, unspecified: Secondary | ICD-10-CM

## 2023-11-26 DIAGNOSIS — F3341 Major depressive disorder, recurrent, in partial remission: Secondary | ICD-10-CM | POA: Diagnosis not present

## 2023-11-26 DIAGNOSIS — F411 Generalized anxiety disorder: Secondary | ICD-10-CM

## 2023-11-26 MED ORDER — DULOXETINE HCL 60 MG PO CPEP: 60.0000 mg | ORAL_CAPSULE | Freq: Every day | ORAL | 0 refills | Status: DC

## 2023-11-26 NOTE — Patient Instructions (Signed)
 Continue duloxetine 60 mg  Continue Abilify 2 mg daily  Next appointment: 6/18 at 8 am

## 2024-01-22 DIAGNOSIS — Z124 Encounter for screening for malignant neoplasm of cervix: Secondary | ICD-10-CM | POA: Diagnosis not present

## 2024-01-22 DIAGNOSIS — N921 Excessive and frequent menstruation with irregular cycle: Secondary | ICD-10-CM | POA: Diagnosis not present

## 2024-01-22 DIAGNOSIS — Z0000 Encounter for general adult medical examination without abnormal findings: Secondary | ICD-10-CM | POA: Diagnosis not present

## 2024-01-22 DIAGNOSIS — Z01419 Encounter for gynecological examination (general) (routine) without abnormal findings: Secondary | ICD-10-CM | POA: Diagnosis not present

## 2024-01-23 DIAGNOSIS — H5213 Myopia, bilateral: Secondary | ICD-10-CM | POA: Diagnosis not present

## 2024-02-02 ENCOUNTER — Other Ambulatory Visit: Payer: Self-pay | Admitting: Psychiatry

## 2024-02-02 ENCOUNTER — Telehealth: Payer: Self-pay

## 2024-02-02 MED ORDER — ARIPIPRAZOLE 2 MG PO TABS: 2.0000 mg | ORAL_TABLET | Freq: Every day | ORAL | 0 refills | Status: DC

## 2024-02-02 NOTE — Telephone Encounter (Signed)
 Ordered

## 2024-02-02 NOTE — Telephone Encounter (Signed)
 received fax requesting a refill on the aripiprazole . pt last seen on 3-26 next appt 6-18

## 2024-02-02 NOTE — Telephone Encounter (Signed)
 Pt.notified

## 2024-02-18 ENCOUNTER — Telehealth: Admitting: Psychiatry
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
## 2024-02-19 DIAGNOSIS — R635 Abnormal weight gain: Secondary | ICD-10-CM | POA: Diagnosis not present

## 2024-02-19 DIAGNOSIS — Z3202 Encounter for pregnancy test, result negative: Secondary | ICD-10-CM | POA: Diagnosis not present

## 2024-02-19 NOTE — Progress Notes (Addendum)
 Subjective Patient ID: Kaitlyn Whitaker is a 28 y.o. female.    Patient presents for concerns unexplained weight gain of 30 pounds over the last 6 months post induced abortion.  Complains of intermittent abdominal bloating, irregular periods, abdominal cramping periodically.  Denies any current abdominal pain, dizziness, feelings of cold, light headedness, problems eating or drinking, chest pain, or shortness of breath.  Patient most concerned about weight gain, pcm appointment in 6 weeks, came because of family.   History provided by:  Patient Language interpreter used: No   Abdominal Pain    Review of Systems  HENT:  Negative for congestion and rhinorrhea.   Gastrointestinal:  Positive for abdominal pain.  Neurological:  Negative for dizziness.    Patient History  Allergies: No Known Allergies  Past Medical History:  Diagnosis Date  . Anxiety   . Bipolar affective (CMS/HCC)   . Bipolar disease, chronic (CMS/HCC)   . Depression    History reviewed. No pertinent surgical history. Social History   Socioeconomic History  . Marital status: Single    Spouse name: Not on file  . Number of children: Not on file  . Years of education: Not on file  . Highest education level: Not on file  Occupational History  . Not on file  Tobacco Use  . Smoking status: Never  . Smokeless tobacco: Never  Vaping Use  . Vaping status: Never Used  Substance and Sexual Activity  . Alcohol use: Never  . Drug use: Never  . Sexual activity: Yes  Other Topics Concern  . Not on file  Social History Narrative  . Not on file   History reviewed. No pertinent family history. Current Outpatient Medications on File Prior to Visit  Medication Sig Dispense Refill  . ARIPiprazole  (Abilify ) 2 MG tablet Take 2 mg by mouth 1 (one) time each day.    . DULoxetine  (Cymbalta ) 60 MG DR capsule Take 60 mg by mouth 1 (one) time each day.    . norethindrone -ethinyl estradiol-ferrous fumarate (Loestrin 24  FE) 1-20 MG-MCG(24) tablet Take 1 tablet by mouth 1 (one) time each day.    . DULoxetine  (Cymbalta ) 60 MG DR capsule Take 60 mg by mouth.    . lamoTRIgine  (LaMICtal ) 25 MG tablet Take 50 mg by mouth 1 (one) time each day.     No current facility-administered medications on file prior to visit.    Objective  Vitals:   02/19/24 0942 02/19/24 1032  BP: 119/81   Pulse: (!) 109 77  Resp: 18   Temp: 37.4 C (99.4 F)   TempSrc: Tympanic   SpO2: 96%   Weight: 66 kg   Height: 5' 4   PainSc: 0-No pain   LMP: 01/17/2024    LMP date is Approximate   OBGYN/Pregnancy Status: Having periods      No results found.  Physical Exam Vitals and nursing note reviewed.  Constitutional:      General: She is not in acute distress.    Appearance: Normal appearance.  HENT:     Head: Normocephalic.     Nose: Nose normal.     Mouth/Throat:     Mouth: Mucous membranes are moist.  Eyes:     Extraocular Movements: Extraocular movements intact.     Conjunctiva/sclera: Conjunctivae normal.  Cardiovascular:     Rate and Rhythm: Normal rate and regular rhythm.     Pulses: Normal pulses.     Heart sounds: Normal heart sounds.  Pulmonary:     Effort:  Pulmonary effort is normal.     Breath sounds: Normal breath sounds.  Abdominal:     General: Abdomen is flat. Bowel sounds are normal. There is no distension.     Palpations: Abdomen is soft. There is no mass.     Tenderness: There is abdominal tenderness. There is no right CVA tenderness, left CVA tenderness, guarding or rebound.     Hernia: No hernia is present.     Comments: Mild right adnexal discomfort.   Musculoskeletal:        General: Normal range of motion.     Cervical back: Normal range of motion and neck supple. No tenderness.  Lymphadenopathy:     Cervical: No cervical adenopathy.  Skin:    General: Skin is warm.  Neurological:     Mental Status: She is alert and oriented to person, place, and time.  Psychiatric:        Mood  and Affect: Mood normal.        Behavior: Behavior normal.     Results for orders placed or performed in visit on 02/19/24  POCT pregnancy, urine manually resulted  Component Result   Preg Test, Ur Negative   Internal Quality Control Pass  POCT urinalysis dipstick manually resulted  Component Result   Color, UA Amber (A)   Clarity, UA Clear   Glucose, UA Negative   Bilirubin, UA 1+ (A)   Ketones, UA 2+ (A)   Spec Grav, UA >=1.030 (A)   Blood, UA Trace (A)   pH, UA 6.0   Protein, UA Trace (A)   Urobilinogen, UA Negative   Leukocytes, UA Negative   Nitrite, UA Negative       Procedures MDM:     1 Undiagnosed new problem with uncertain prognosis     Explanation of Medical Decision Making and variances from expected care:  Patient's vital signs and presentation are stable.  No emergent electrolyte findings on exam.  Patient has been dealing with symptoms for the last 6 months and they are not changed, she is just concerned because she continues to gain weight.  States that she is monitoring her nutritional intake and activity expenditure.  Explained labs done as a courtesy and will direct to ER for abnormal findings, given patient is stable and has PCM followed up scheduled.  The abdominal discomfort on exam is her regular.  Patient stated understanding and accepts risks.  ER precautions given discussed concerns for possible abdominal bleed, obstruction, or other abdominal pathology.  Given normal exam findings, and gradual nature of weight gain do not suspect emergent causes.  Discussed medication causes also.   Assessment requiring historian other than patient: No     Independent visualization of image, tracing, or test: No     Discussion of management with another provider: No     Risk:: Moderate            Assessment/Plan Diagnoses and all orders for this visit:  Unexplained weight gain -     Thyroid  Panel with TSH -     CBC With Differential/Platelet -      Comprehensive metabolic panel -     Hemoglobin A1c  Other orders -     POCT pregnancy, urine manually resulted -     POCT urinalysis dipstick manually resulted     Disposition Status: Home  There are no Patient Instructions on file for this visit.  Progress note signed by Debby Rung, PA on 02/19/24 at 11:46 AM

## 2024-02-24 ENCOUNTER — Encounter: Payer: Self-pay | Admitting: Psychiatry

## 2024-02-24 ENCOUNTER — Telehealth (INDEPENDENT_AMBULATORY_CARE_PROVIDER_SITE_OTHER): Admitting: Psychiatry

## 2024-02-24 DIAGNOSIS — F4310 Post-traumatic stress disorder, unspecified: Secondary | ICD-10-CM | POA: Diagnosis not present

## 2024-02-24 DIAGNOSIS — F411 Generalized anxiety disorder: Secondary | ICD-10-CM

## 2024-02-24 DIAGNOSIS — F3342 Major depressive disorder, recurrent, in full remission: Secondary | ICD-10-CM

## 2024-02-24 MED ORDER — DULOXETINE HCL 60 MG PO CPEP: 60.0000 mg | ORAL_CAPSULE | Freq: Every day | ORAL | 0 refills | Status: DC

## 2024-02-24 MED ORDER — ARIPIPRAZOLE 2 MG PO TABS: 2.0000 mg | ORAL_TABLET | Freq: Every day | ORAL | 0 refills | Status: DC

## 2024-02-24 NOTE — Patient Instructions (Addendum)
  Continue duloxetine  60 mg  Continue Abilify  2 mg daily  Next appointment: 9/24 at 8 40, video Please contact the office regarding the blood test for your lipid panel.

## 2024-02-25 DIAGNOSIS — H5213 Myopia, bilateral: Secondary | ICD-10-CM | POA: Diagnosis not present

## 2024-04-08 DIAGNOSIS — H52223 Regular astigmatism, bilateral: Secondary | ICD-10-CM | POA: Diagnosis not present

## 2024-04-08 DIAGNOSIS — H5213 Myopia, bilateral: Secondary | ICD-10-CM | POA: Diagnosis not present

## 2024-05-20 ENCOUNTER — Other Ambulatory Visit: Payer: Self-pay | Admitting: Medical Genetics
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
## 2024-05-26 ENCOUNTER — Telehealth: Admitting: Psychiatry

## 2024-05-26 ENCOUNTER — Encounter: Payer: Self-pay | Admitting: Psychiatry

## 2024-05-26 DIAGNOSIS — Z5181 Encounter for therapeutic drug level monitoring: Secondary | ICD-10-CM | POA: Diagnosis not present

## 2024-05-26 DIAGNOSIS — F4310 Post-traumatic stress disorder, unspecified: Secondary | ICD-10-CM

## 2024-05-26 DIAGNOSIS — F3181 Bipolar II disorder: Secondary | ICD-10-CM | POA: Diagnosis not present

## 2024-05-26 DIAGNOSIS — Z79899 Other long term (current) drug therapy: Secondary | ICD-10-CM

## 2024-05-26 DIAGNOSIS — F411 Generalized anxiety disorder: Secondary | ICD-10-CM

## 2024-05-26 MED ORDER — DULOXETINE HCL 60 MG PO CPEP: 60.0000 mg | ORAL_CAPSULE | Freq: Every day | ORAL | 0 refills | Status: DC

## 2024-05-26 MED ORDER — ARIPIPRAZOLE 5 MG PO TABS: 5.0000 mg | ORAL_TABLET | Freq: Every day | ORAL | 1 refills | Status: DC

## 2024-05-26 NOTE — Patient Instructions (Signed)
 Continue duloxetine  60 mg  Increase Abilify  5 mg daily Obtain labs at [facility]- lipids Next appointment: 11/12 at 8 20

## 2024-06-10 ENCOUNTER — Other Ambulatory Visit

## 2024-06-25 ENCOUNTER — Other Ambulatory Visit: Payer: Self-pay | Admitting: Psychiatry
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
## 2024-07-14 ENCOUNTER — Telehealth (INDEPENDENT_AMBULATORY_CARE_PROVIDER_SITE_OTHER): Admitting: Psychiatry

## 2024-07-14 ENCOUNTER — Encounter: Payer: Self-pay | Admitting: Psychiatry

## 2024-07-14 DIAGNOSIS — F4310 Post-traumatic stress disorder, unspecified: Secondary | ICD-10-CM | POA: Diagnosis not present

## 2024-07-14 DIAGNOSIS — F411 Generalized anxiety disorder: Secondary | ICD-10-CM

## 2024-07-14 DIAGNOSIS — F3181 Bipolar II disorder: Secondary | ICD-10-CM | POA: Diagnosis not present

## 2024-07-14 DIAGNOSIS — Z79899 Other long term (current) drug therapy: Secondary | ICD-10-CM

## 2024-07-14 MED ORDER — ARIPIPRAZOLE 5 MG PO TABS: 5.0000 mg | ORAL_TABLET | Freq: Every day | ORAL | 0 refills | Status: DC

## 2024-07-14 MED ORDER — DULOXETINE HCL 60 MG PO CPEP: 60.0000 mg | ORAL_CAPSULE | Freq: Every day | ORAL | 1 refills | Status: AC

## 2024-07-14 NOTE — Patient Instructions (Signed)
 Continue duloxetine  60 mg  Continue Abilify  5 mg daily Obtain labs at [facility]- lipids Next appointment: 2/4 at 8 20

## 2024-09-10 ENCOUNTER — Other Ambulatory Visit: Payer: Self-pay | Admitting: Medical Genetics

## 2024-09-10 DIAGNOSIS — Z006 Encounter for examination for normal comparison and control in clinical research program: Secondary | ICD-10-CM

## 2024-09-15 NOTE — Progress Notes (Signed)
 Chief Complaint:  Chief Complaint  Patient presents with   Consult    Surgical consult tubal ligation   Subjective  Kaitlyn Whitaker is a 29 y.o. [id number]  who presents for preop BTL consult. Patient desires permanent sterilization. She has completed childbearing and no long wishes to become pregnant, regardless of future life changes, such as a new partner or the unfortunate loss of a child. She is aware of reversible, effective contraceptive options, including IUD, implant, and vasectomy for her partner, and she declines all of these options in favor of permanent sterilization.   GYN History: Patient's last menstrual period was 09/05/2024 (exact date). Menses: regular X monthly Last mammogram: n/a Last pap smear: unsure h/o abnormal pap smears: no h/o STDs: chlamydia Sexually active: with female Contraception: POPs  Past History   There is no problem list on file for this patient.[1]   OB History  Gravida Para Term Preterm AB Living  5 3 3  0 2 3  SAB IAB Ectopic Molar Multiple Live Births  1 1 0 0 0 3    # Outcome Date GA Lbr Len/2nd Weight Sex Type Anes PTL Lv  5 IAB 09/25/23      None  FD  4 Term 06/25/23    M  None  LIV  3 Term 02/10/20    F  EPI  LIV     Complications: Eclampsia in Labor  2 Term 10/15/16    M  EPI N LIV  1 SAB     U    FD    Medical History[2]   Surgical History[3]  Social History   Socioeconomic History   Marital status: Single    Spouse name: Not on file   Number of children: Not on file   Years of education: Not on file   Highest education level: Not on file  Occupational History   Not on file  Tobacco Use   Smoking status: Former    Passive exposure: Never   Smokeless tobacco: Never  Vaping Use   Vaping status: Never Used  Substance and Sexual Activity   Alcohol use: No   Drug use: Never    Comment: Drug use: Denies   Sexual activity: Yes    Partners: Male    Birth control/protection: OCP    Comment: Norethindrone    Other Topics Concern   Not on file  Social History Narrative   Not on file   Social Drivers of Health   Food Insecurity: Low Risk (09/08/2024)   Food vital sign    Within the past 12 months, you worried that your food would run out before you got money to buy more: Never true    Within the past 12 months, the food you bought just didn't last and you didn't have money to get more: Never true  Transportation Needs: No Transportation Needs (09/08/2024)   Transportation    In the past 12 months, has lack of reliable transportation kept you from medical appointments, meetings, work or from getting things needed for daily living? : No  Living Situation: Low Risk (09/08/2024)   Living Situation    What is your living situation today?: I have a steady place to live    Think about the place you live. Do you have problems with any of the following? Choose all that apply:: Pt declined to answer  Utilities: Medium Risk (09/08/2024)   Utilities    In the past 12 months has the electric, gas, oil, or water company  threatened to shut off services in your home? : Yes  Safety: Not At Risk (06/25/2023)   Received from [facility]   Safety    Within the last year, have you been afraid of your partner or ex-partner?: No    Within the last year, have you been humiliated or emotionally abused in other ways by your partner or ex-partner?: No    Within the last year, have you been kicked, hit, slapped, or otherwise physically hurt by your partner or ex-partner?: No    Within the last year, have you been raped or forced to have any kind of sexual activity by your partner or ex-partner?: No  Depression: Not at risk (02/14/2024)   Received from [facility]   PHQ-2    PHQ-2 Total Score: 0  Tobacco Use: Medium Risk (09/15/2024)   Patient History    Smoking Tobacco Use: Former    Smokeless Tobacco Use: Never    Passive Exposure: Never  Alcohol Screening: Not on file    Family  History[4]  Home Medications           * ARIPiprazole  (ABILIFY ) 5 mg tablet   * DULoxetine  (CYMBALTA ) 60 mg capsule   * norethindrone  0.35 mg tab    Take 1 tablet (0.35 mg total) by mouth daily.       Allergies: Patient has no known allergies.    Review of Systems Pertinent items are noted in HPI.   Objective  BP 120/70 (BP Location: Left arm, Patient Position: Sitting)   Ht 1.626 m (5' 4)   Wt 63.4 kg (139 lb 12.8 oz)   LMP 09/05/2024 (Exact Date)   BMI 24.00 kg/m  Gen: no acute distress Resp: no increased WOB, equal chest rise and fall Card: Normal peripheral perfusion GU: deferred Neuro: Cranial nerves grossly intact, no focal deficit Skin: No rash, warm and dry Psych: Normal mood and affect, alert oriented Ext: ROM intact, ambulatory without assistance  Data Review  None  Assessment  Problem List Items Addressed This Visit   None Visit Diagnoses       Unwanted fertility    -  Primary       Plan  Surgery request submitted for lap BS. Consented for surgery today. All questions answered. Medicaid consent form signed today.    F/u pending surgery scheduling  Comer Almarie Koller, MD [facility]      [1] There is no problem list on file for this patient.  [2] Past Medical History: Diagnosis Date   Dysuria    Pain passing urine  [3] Past Surgical History: Procedure Laterality Date   ADENOIDECTOMY     TONSILLECTOMY    [4] Family History Problem Relation Name Age of Onset   Heart attack Paternal Grandfather     Hyperlipidemia Maternal Grandmother     Hyperlipidemia Mother     Breast cancer Neg Hx     Ovarian cancer Neg Hx     Uterine cancer Neg Hx    *Some images could not be shown.

## 2024-09-15 NOTE — Telephone Encounter (Signed)
 Surgery request submitted.
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
## 2024-10-06 ENCOUNTER — Encounter: Payer: Self-pay | Admitting: Psychiatry

## 2024-10-06 ENCOUNTER — Telehealth: Admitting: Psychiatry

## 2024-10-06 DIAGNOSIS — Z5181 Encounter for therapeutic drug level monitoring: Secondary | ICD-10-CM

## 2024-10-06 DIAGNOSIS — Z79899 Other long term (current) drug therapy: Secondary | ICD-10-CM

## 2024-10-06 DIAGNOSIS — F3181 Bipolar II disorder: Secondary | ICD-10-CM

## 2024-10-06 DIAGNOSIS — F4310 Post-traumatic stress disorder, unspecified: Secondary | ICD-10-CM

## 2024-10-06 DIAGNOSIS — F411 Generalized anxiety disorder: Secondary | ICD-10-CM | POA: Diagnosis not present

## 2024-10-06 MED ORDER — ARIPIPRAZOLE 5 MG PO TABS: 5.0000 mg | ORAL_TABLET | Freq: Every day | ORAL | 0 refills | Status: AC

## 2024-10-06 NOTE — Patient Instructions (Signed)
 Continue duloxetine  60 mg  Continue Abilify  5 mg daily Obtain labs at [facility]- lipids Next appointment: 4/30 at 1 pm

## 2024-12-30 ENCOUNTER — Ambulatory Visit: Admitting: Psychiatry
